# Patient Record
Sex: Female | Born: 1970 | Race: Black or African American | Hispanic: No | Marital: Single | Smoking: Never smoker
Health system: Southern US, Community
[De-identification: ages and names within clinical notes are randomized; demographics above are authoritative.]

## PROBLEM LIST (undated history)

## (undated) DIAGNOSIS — F419 Anxiety disorder, unspecified: Secondary | ICD-10-CM

## (undated) DIAGNOSIS — Z1379 Encounter for other screening for genetic and chromosomal anomalies: Secondary | ICD-10-CM

## (undated) DIAGNOSIS — C50919 Malignant neoplasm of unspecified site of unspecified female breast: Secondary | ICD-10-CM

## (undated) DIAGNOSIS — N920 Excessive and frequent menstruation with regular cycle: Secondary | ICD-10-CM

## (undated) DIAGNOSIS — R87610 Atypical squamous cells of undetermined significance on cytologic smear of cervix (ASC-US): Secondary | ICD-10-CM

## (undated) DIAGNOSIS — C50419 Malignant neoplasm of upper-outer quadrant of unspecified female breast: Secondary | ICD-10-CM

## (undated) DIAGNOSIS — N870 Mild cervical dysplasia: Secondary | ICD-10-CM

## (undated) DIAGNOSIS — E559 Vitamin D deficiency, unspecified: Secondary | ICD-10-CM

## (undated) DIAGNOSIS — D649 Anemia, unspecified: Secondary | ICD-10-CM

## (undated) DIAGNOSIS — G43909 Migraine, unspecified, not intractable, without status migrainosus: Secondary | ICD-10-CM

## (undated) DIAGNOSIS — Z9221 Personal history of antineoplastic chemotherapy: Secondary | ICD-10-CM

## (undated) HISTORY — PX: PORTACATH PLACEMENT: SHX2246

## (undated) HISTORY — DX: Atypical squamous cells of undetermined significance on cytologic smear of cervix (ASC-US): R87.610

## (undated) HISTORY — DX: Excessive and frequent menstruation with regular cycle: N92.0

## (undated) HISTORY — DX: Encounter for other screening for genetic and chromosomal anomalies: Z13.79

## (undated) HISTORY — DX: Malignant neoplasm of upper-outer quadrant of unspecified female breast: C50.419

## (undated) HISTORY — DX: Mild cervical dysplasia: N87.0

## (undated) HISTORY — DX: Anemia, unspecified: D64.9

## (undated) HISTORY — DX: Migraine, unspecified, not intractable, without status migrainosus: G43.909

## (undated) HISTORY — PX: ABDOMINAL HYSTERECTOMY: SHX81

## (undated) HISTORY — DX: Vitamin D deficiency, unspecified: E55.9

## (undated) HISTORY — PX: TUBAL LIGATION: SHX77

## (undated) HISTORY — DX: Malignant neoplasm of unspecified site of unspecified female breast: C50.919

---

## 1996-12-05 HISTORY — PX: CERVICAL BIOPSY  W/ LOOP ELECTRODE EXCISION: SUR135

## 2009-07-08 ENCOUNTER — Ambulatory Visit: Payer: Self-pay

## 2010-07-13 ENCOUNTER — Ambulatory Visit: Payer: Self-pay

## 2010-09-15 HISTORY — PX: ESSURE TUBAL LIGATION: SUR464

## 2010-12-24 ENCOUNTER — Ambulatory Visit: Payer: Self-pay | Admitting: Obstetrics and Gynecology

## 2011-07-18 ENCOUNTER — Ambulatory Visit: Payer: Self-pay

## 2011-07-26 ENCOUNTER — Ambulatory Visit: Payer: Self-pay

## 2013-12-05 HISTORY — PX: EXCISIONAL HEMORRHOIDECTOMY: SHX1541

## 2014-02-02 DIAGNOSIS — Z1379 Encounter for other screening for genetic and chromosomal anomalies: Secondary | ICD-10-CM

## 2014-02-02 DIAGNOSIS — C50919 Malignant neoplasm of unspecified site of unspecified female breast: Secondary | ICD-10-CM

## 2014-02-02 HISTORY — DX: Encounter for other screening for genetic and chromosomal anomalies: Z13.79

## 2014-02-02 HISTORY — PX: BREAST BIOPSY: SHX20

## 2014-02-02 HISTORY — DX: Malignant neoplasm of unspecified site of unspecified female breast: C50.919

## 2014-02-10 ENCOUNTER — Ambulatory Visit: Payer: Self-pay

## 2014-02-12 ENCOUNTER — Ambulatory Visit: Payer: Self-pay

## 2014-02-18 ENCOUNTER — Encounter: Payer: Self-pay | Admitting: General Surgery

## 2014-02-18 ENCOUNTER — Ambulatory Visit (INDEPENDENT_AMBULATORY_CARE_PROVIDER_SITE_OTHER): Payer: BC Managed Care – PPO | Admitting: General Surgery

## 2014-02-18 VITALS — BP 104/70 | HR 68 | Resp 12 | Ht 63.0 in | Wt 127.0 lb

## 2014-02-18 DIAGNOSIS — Z170 Estrogen receptor positive status [ER+]: Secondary | ICD-10-CM | POA: Insufficient documentation

## 2014-02-18 DIAGNOSIS — C50411 Malignant neoplasm of upper-outer quadrant of right female breast: Secondary | ICD-10-CM

## 2014-02-18 DIAGNOSIS — C50419 Malignant neoplasm of upper-outer quadrant of unspecified female breast: Secondary | ICD-10-CM

## 2014-02-18 DIAGNOSIS — C50919 Malignant neoplasm of unspecified site of unspecified female breast: Secondary | ICD-10-CM

## 2014-02-18 NOTE — Patient Instructions (Signed)
Patient to have labs drawn at [facility] lab and a chest x-ray when convenient (per patient's request due to the late hour).

## 2014-02-18 NOTE — Progress Notes (Signed)
Patient ID: Sandra Nguyen, female   DOB: 1971-01-02, 43 y.o.   MRN: [id number]  Chief Complaint  Patient presents with  . Other    breast cancer    HPI Sandra Nguyen is a 43 y.o. female who presents for a breast evaluation. The most recent mammogram was done on 02/10/14 as well as a right breast ultrasound. The patient noticed a mass in the left breast on a routine self-exam within the last month. She subsequently underwent a mammogram and ultrasound at the [facility] center followed by a core biopsy of the right breast on 02/12/14. The patient has been previously notified of the biopsy results. Patient does perform regular self breast checks and gets regular mammograms done. She states she found a lump 2 weeks ago prior to her mammogram. The area is located in the right upper outer quadrant of the right breast. No prior breast problems or injuries. The patient has had genetic testing instituted by Ardeth Perfect, PA at [place]. Results are still pending. The patient is accompanied today by her husband.  She works in the [employer]. The patient has a single daughter age 30 living in [place], with plans to marry her partner in April 2015. The patient has recently taken of the responsibility of raising her late brother's 10-monthold daughter. Her [place]  The patient denies any previous breast problems. No history of trauma to the breast.  HPI  History reviewed. No pertinent past medical history.  Past Surgical History  Procedure Laterality Date  . Leep  1990's  . Breast biopsy Right 2015    Family History  Problem Relation Age of Onset  . Cancer Maternal Aunt     breast  . Cancer Maternal Aunt     ovarian    Social History History  Substance Use Topics  . Smoking status: Never Smoker   . Smokeless tobacco: Never Used  . Alcohol Use: No    No Known Allergies  No current  outpatient prescriptions on file.   No current facility-administered medications for this visit.    Review of Systems Review of Systems  Constitutional: Negative.   Respiratory: Negative.   Cardiovascular: Negative.     Blood pressure 104/70, pulse 68, resp. rate 12, height '5\' 3"'  (1.6 m), weight 127 lb (57.607 kg), last menstrual period 02/06/2014.  Physical Exam Physical Exam  Constitutional: She is oriented to person, place, and time. She appears well-developed and well-nourished.  Neck: Neck supple. No thyromegaly present.  Cardiovascular: Normal rate, regular rhythm and normal heart sounds.   No murmur heard. Pulmonary/Chest: Effort normal and breath sounds normal. Right breast exhibits mass (2 cm mass just outside of areola 10 o'clock position.). Right breast exhibits no inverted nipple, no nipple discharge, no skin change and no tenderness. Left breast exhibits no inverted nipple, no mass, no nipple discharge, no skin change and no tenderness. Breasts are symmetrical.    The patient has a modest breast volume "B" cup on my estimation. She reports that her last fitting was for a "C" cup bra.   Lymphadenopathy:    She has no cervical adenopathy.    She has no axillary adenopathy.  Neurological: She is alert and oriented to person, place, and time.  Skin: Skin is warm and dry.    Data Reviewed Biopsy showed invasive mammary carcinoma with no specific type, histologic grade 2. 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 Receptor results are pending at this time.  The mass is estimated a 1.5 cm on imaging studies.  Assessment    Stage I carcinoma of the right breast.     Plan    We spent about 45 minutes reviewing where we stand with this early stage cancer and treatment options. Breast conservation and mastectomy were presented as equivalent therapeutic procedures. 6% risk of local recurrence over 10 years with breast conservation versus 2% with mastectomy, with no  increased morbidity for those patient to do experience recurrent after partial breast resection followed by salvage mastectomy.  Options for plastic surgery consultation were reviewed.  Options for medical oncology assessment were reviewed. Pending final delineation of size and nodal status, and that she is not a candidate for neoadjuvant chemotherapy, early consultation was not felt mandatory.  Options for second surgical pain were discussed. Informational brochure was provided.  Whether she should wait for her BRCA testing results to return before having surgery was reviewed. Her family history is not suggestive of a mutation carrier, and I think it's reasonable to proceed whenever the patient is ready.  Laboratory studies a chest x-ray will be obtained in the near future.      Patient to have the following labs drawn at [facility] lab today: CBC, Met C, CEA, and CA 27-29. This patient also needs a chest x-ray PA and Lateral (per patient's request, due to the late hour, she will have done at a later time). Order placed in order facilitator for patient to have done at her convenience.    Robert Bellow 02/18/2014, 9:05 PM

## 2014-02-19 LAB — COMPREHENSIVE METABOLIC PANEL WITH GFR
ALT: 11 IU/L (ref 0–32)
AST: 17 IU/L (ref 0–40)
Albumin/Globulin Ratio: 2 (ref 1.1–2.5)
Albumin: 4.3 g/dL (ref 3.5–5.5)
Alkaline Phosphatase: 56 IU/L (ref 39–117)
BUN/Creatinine Ratio: 13 (ref 9–23)
BUN: 10 mg/dL (ref 6–24)
CO2: 21 mmol/L (ref 18–29)
Calcium: 9.3 mg/dL (ref 8.7–10.2)
Chloride: 108 mmol/L (ref 97–108)
Creatinine, Ser: 0.78 mg/dL (ref 0.57–1.00)
GFR calc Af Amer: 107 mL/min/1.73
GFR calc non Af Amer: 93 mL/min/1.73
Globulin, Total: 2.1 g/dL (ref 1.5–4.5)
Glucose: 79 mg/dL (ref 65–99)
Potassium: 4.3 mmol/L (ref 3.5–5.2)
Sodium: 144 mmol/L (ref 134–144)
Total Bilirubin: <0.2 mg/dL (ref 0.0–1.2)
Total Protein: 6.4 g/dL (ref 6.0–8.5)

## 2014-02-19 LAB — CBC WITH DIFFERENTIAL
Basophils Absolute: 0 x10E3/uL (ref 0.0–0.2)
Basos: 0 %
Eos: 4 %
Eosinophils Absolute: 0.2 x10E3/uL (ref 0.0–0.4)
HCT: 35 % (ref 34.0–46.6)
Hemoglobin: 11.2 g/dL (ref 11.1–15.9)
Immature Grans (Abs): 0 x10E3/uL (ref 0.0–0.1)
Immature Granulocytes: 0 %
Lymphocytes Absolute: 2.3 x10E3/uL (ref 0.7–3.1)
Lymphs: 46 %
MCH: 28.1 pg (ref 26.6–33.0)
MCHC: 32 g/dL (ref 31.5–35.7)
MCV: 88 fL (ref 79–97)
Monocytes Absolute: 0.3 x10E3/uL (ref 0.1–0.9)
Monocytes: 7 %
Neutrophils Absolute: 2.1 x10E3/uL (ref 1.4–7.0)
Neutrophils Relative %: 43 %
Platelets: 272 x10E3/uL (ref 150–379)
RBC: 3.98 x10E6/uL (ref 3.77–5.28)
RDW: 14.3 % (ref 12.3–15.4)
WBC: 4.9 x10E3/uL (ref 3.4–10.8)

## 2014-02-19 LAB — CANCER ANTIGEN 27.29: CA 27.29: 37.4 U/mL (ref 0.0–38.6)

## 2014-02-19 LAB — PATHOLOGY REPORT

## 2014-02-19 LAB — CEA: CEA: 0.5 ng/mL (ref 0.0–4.7)

## 2014-02-20 ENCOUNTER — Telehealth: Payer: Self-pay | Admitting: General Surgery

## 2014-02-20 NOTE — Telephone Encounter (Signed)
Notified labs OK. ER/PR positive, HER 2 neu over-expressing. May be a candidate for neo-adjuvant chemo under protocol to shrink tumor and make lumpectomy (her choice for surgical treatment) more successful. She is amenable to meet w/ med onc. Case discussed w/ Dr. Oliva Bustard from medical oncology.

## 2014-02-21 ENCOUNTER — Ambulatory Visit: Payer: Self-pay | Admitting: Oncology

## 2014-02-21 ENCOUNTER — Telehealth: Payer: Self-pay | Admitting: *Deleted

## 2014-02-21 NOTE — Telephone Encounter (Signed)
Message left for the [facility] that we need an appointment for this patient.

## 2014-02-21 NOTE — Telephone Encounter (Signed)
Patient has been scheduled for an appointment at the [facility] for 02-25-14 at 9 am. This patient is aware of date, time, and instructions.

## 2014-02-21 NOTE — Telephone Encounter (Signed)
Message copied by Dominga Ferry on Fri Feb 21, 2014 10:38 AM ------      Message from: Christmas, Forest Gleason      Created: Thu Feb 20, 2014  7:09 PM       Please make an appt w/ Dr. Oliva Bustard for early next week: HER 2 positive breast cancer.  (I have spoken with him, and patient is aware you will be arranging. She is out of town, so call her cell w/ appt time. Thanks.  ------

## 2014-02-24 ENCOUNTER — Ambulatory Visit: Payer: Self-pay | Admitting: General Surgery

## 2014-02-25 ENCOUNTER — Ambulatory Visit: Payer: Self-pay | Admitting: Oncology

## 2014-02-25 ENCOUNTER — Telehealth: Payer: Self-pay | Admitting: General Surgery

## 2014-02-25 NOTE — Telephone Encounter (Signed)
The patient was seen by Forest Gleason, M.D. In the multidisciplinary breast clinic earlier this morning. In light of her HER-2/neu status she is felt to be a candidate for neoadjuvant chemotherapy and a power port was requested.  The Randall power port placement to allow her to have her treatment without interruption was reviewed. The risks of the procedure including injury to the adjacent vasculature and long was discussed. She is amenable to proceed in this will be scheduled at a convenient time.

## 2014-02-26 ENCOUNTER — Other Ambulatory Visit: Payer: Self-pay | Admitting: General Surgery

## 2014-02-26 ENCOUNTER — Ambulatory Visit: Payer: BC Managed Care – PPO | Admitting: General Surgery

## 2014-02-26 DIAGNOSIS — C50919 Malignant neoplasm of unspecified site of unspecified female breast: Secondary | ICD-10-CM

## 2014-02-27 ENCOUNTER — Telehealth: Payer: Self-pay | Admitting: *Deleted

## 2014-02-27 NOTE — Telephone Encounter (Signed)
Patient states the productive cough is yellow in color in the am hours. Dr Bary Castilla aware, Zithromax ( #6) 2 today and one q day x4 and Tussinex (120 ml)  5 ml po BID as needed, RX written, pt agrees and aware to pick up RX.

## 2014-02-27 NOTE — Telephone Encounter (Signed)
I talked with the patient and she said she can't tell that she is a lot better. She is still coughing , spratic but once she starts it seem to be "multiple coughs in a row", worse when she is hot. Denies fever.

## 2014-02-28 ENCOUNTER — Ambulatory Visit: Payer: Self-pay | Admitting: General Surgery

## 2014-02-28 ENCOUNTER — Encounter: Payer: Self-pay | Admitting: General Surgery

## 2014-02-28 DIAGNOSIS — C50919 Malignant neoplasm of unspecified site of unspecified female breast: Secondary | ICD-10-CM

## 2014-03-03 ENCOUNTER — Telehealth: Payer: Self-pay | Admitting: *Deleted

## 2014-03-03 ENCOUNTER — Encounter: Payer: Self-pay | Admitting: General Surgery

## 2014-03-03 NOTE — Telephone Encounter (Addendum)
Patient called the answering service 03/02/14,returning her call at 9:08 on 03/03/14 .Patient talked with Dr.Smith  about taking off her dressing he states that she can leave the dressing off.Patient pleased.

## 2014-03-04 LAB — HCG, QUANTITATIVE, PREGNANCY

## 2014-03-05 ENCOUNTER — Ambulatory Visit: Payer: Self-pay | Admitting: Oncology

## 2014-03-05 LAB — CBC CANCER CENTER
BASOS ABS: 0 x10 3/mm (ref 0.0–0.1)
Basophil %: 0.2 %
EOS PCT: 3.1 %
Eosinophil #: 0.2 x10 3/mm (ref 0.0–0.7)
HCT: 35.2 % (ref 35.0–47.0)
HGB: 11.3 g/dL — ABNORMAL LOW (ref 12.0–16.0)
Lymphocyte #: 1.9 x10 3/mm (ref 1.0–3.6)
Lymphocyte %: 37.6 %
MCH: 27.8 pg (ref 26.0–34.0)
MCHC: 32.1 g/dL (ref 32.0–36.0)
MCV: 87 fL (ref 80–100)
Monocyte #: 0.4 x10 3/mm (ref 0.2–0.9)
Monocyte %: 8.5 %
NEUTROS PCT: 50.6 %
Neutrophil #: 2.6 x10 3/mm (ref 1.4–6.5)
Platelet: 301 x10 3/mm (ref 150–440)
RBC: 4.06 10*6/uL (ref 3.80–5.20)
RDW: 13.5 % (ref 11.5–14.5)
WBC: 5.2 x10 3/mm (ref 3.6–11.0)

## 2014-03-05 LAB — COMPREHENSIVE METABOLIC PANEL
ALT: 19 U/L (ref 12–78)
ANION GAP: 7 (ref 7–16)
AST: 20 U/L (ref 15–37)
Albumin: 3.9 g/dL (ref 3.4–5.0)
Alkaline Phosphatase: 60 U/L
BILIRUBIN TOTAL: 0.3 mg/dL (ref 0.2–1.0)
BUN: 17 mg/dL (ref 7–18)
CO2: 27 mmol/L (ref 21–32)
Calcium, Total: 9 mg/dL (ref 8.5–10.1)
Chloride: 105 mmol/L (ref 98–107)
Creatinine: 0.79 mg/dL (ref 0.60–1.30)
EGFR (African American): >60
EGFR (Non-African Amer.): >60
GLUCOSE: 83 mg/dL (ref 65–99)
Osmolality: 278 (ref 275–301)
Potassium: 3.8 mmol/L (ref 3.5–5.1)
SODIUM: 139 mmol/L (ref 136–145)
Total Protein: 7.5 g/dL (ref 6.4–8.2)

## 2014-03-12 LAB — CBC CANCER CENTER
Basophil #: 0 x10 3/mm (ref 0.0–0.1)
Basophil %: 0.6 %
EOS PCT: 0.7 %
Eosinophil #: 0 x10 3/mm (ref 0.0–0.7)
HCT: 36.1 % (ref 35.0–47.0)
HGB: 11.5 g/dL — ABNORMAL LOW (ref 12.0–16.0)
Lymphocyte #: 1 x10 3/mm (ref 1.0–3.6)
Lymphocyte %: 53.3 %
MCH: 27.4 pg (ref 26.0–34.0)
MCHC: 31.8 g/dL — AB (ref 32.0–36.0)
MCV: 86 fL (ref 80–100)
MONOS PCT: 4.5 %
Monocyte #: 0.1 x10 3/mm — ABNORMAL LOW (ref 0.2–0.9)
Neutrophil #: 0.7 x10 3/mm — ABNORMAL LOW (ref 1.4–6.5)
Neutrophil %: 40.9 %
PLATELETS: 248 x10 3/mm (ref 150–440)
RBC: 4.18 10*6/uL (ref 3.80–5.20)
RDW: 13.1 % (ref 11.5–14.5)
WBC: 1.8 x10 3/mm — CL (ref 3.6–11.0)

## 2014-03-17 ENCOUNTER — Encounter: Payer: Self-pay | Admitting: General Surgery

## 2014-03-17 ENCOUNTER — Ambulatory Visit (INDEPENDENT_AMBULATORY_CARE_PROVIDER_SITE_OTHER): Payer: BC Managed Care – PPO | Admitting: General Surgery

## 2014-03-17 VITALS — BP 112/72 | HR 80 | Resp 12 | Ht 63.0 in | Wt 121.0 lb

## 2014-03-17 DIAGNOSIS — K648 Other hemorrhoids: Secondary | ICD-10-CM | POA: Insufficient documentation

## 2014-03-17 DIAGNOSIS — K649 Unspecified hemorrhoids: Secondary | ICD-10-CM

## 2014-03-17 MED ORDER — HYDROCORTISONE ACE-PRAMOXINE 1-1 % RE CREA: 1.0000 "application " | TOPICAL_CREAM | Freq: Three times a day (TID) | RECTAL | Status: DC

## 2014-03-17 MED ORDER — METHYLPREDNISOLONE (PAK) 4 MG PO TABS: 4.0000 mg | ORAL_TABLET | Freq: Every day | ORAL | Status: DC

## 2014-03-17 NOTE — Progress Notes (Signed)
Patient ID: Sandra Nguyen, female   DOB: 12-19-1970, 43 y.o.   MRN: [id number]  Chief Complaint  Patient presents with  . Follow-up    hemorrhoids    HPI Sandra Nguyen is a 43 y.o. female here today for a evaluation of hemorrhoids. Patient started her neo adjuvant chemotherap  on April 1. Patient states she noted hemorrhoids with pain on April 6 and bleeding started on April 7. Patient states its bright red blood on the paper and coating the stool.  Patient has made use of Prep H as well as medicated suppositories.   The bleeding is stopped, but pain persists. She had diarrhea developing one day after the first chemotherapy treatment (April 2). This has abated, and she reports soft stools at this time. HPI  Past Medical History  Diagnosis Date  . Cancer     Past Surgical History  Procedure Laterality Date  . Leep  1990's  . Breast biopsy Right 2015    Family History  Problem Relation Age of Onset  . Cancer Maternal Aunt     breast cancer, age greater than 46 at diagnosis  . Cancer Maternal Aunt     cervical    Social History History  Substance Use Topics  . Smoking status: Never Smoker   . Smokeless tobacco: Never Used  . Alcohol Use: No    No Known Allergies  Current Outpatient Prescriptions  Medication Sig Dispense Refill  . traMADol (ULTRAM) 50 MG tablet Take by mouth every 6 (six) hours as needed.      . methylPREDNIsolone (MEDROL DOSPACK) 4 MG tablet Take 1 tablet (4 mg total) by mouth daily. follow package directions  21 tablet  0  . pramoxine-hydrocortisone (ANALPRAM-HC) 1-1 % rectal cream Place 1 application rectally 3 (three) times daily.  30 g  1   No current facility-administered medications for this visit.    Review of Systems Review of Systems  Constitutional: Positive for chills. Negative for fever, diaphoresis, activity change, appetite change, fatigue and unexpected weight change.  Respiratory: Negative.   Cardiovascular: Negative.    Gastrointestinal: Positive for blood in stool, anal bleeding and rectal pain. Negative for nausea, vomiting, abdominal pain, diarrhea, constipation and abdominal distention.    Blood pressure 112/72, pulse 80, resp. rate 12, height 5\' 3"  (1.6 m), weight 121 lb (54.885 kg), last menstrual period 02/06/2014.  Physical Exam Physical Exam  Constitutional: She is oriented to person, place, and time. She appears well-developed.  Eyes: Conjunctivae are normal.  Neck: Neck supple.  Cardiovascular: Normal rate, regular rhythm, intact distal pulses and normal pulses.   Pulmonary/Chest: Breath sounds normal.  Abdominal: Soft. Normal appearance and bowel sounds are normal.  Genitourinary: Rectal exam shows external hemorrhoid ( 7 and 11o'clock).     Neurological: She is alert and oriented to person, place, and time.  Skin: Skin is warm and dry.     Assessment    External hemorrhoids with ulceration, no clot for drainage.    Plan    The patient will be placed on Analpram-HC 0.5% cream 3 times a day and was given a Medrol Dosepak. She was instructed to take all 6 tablets at one time, decreasing by 1 tablet daily until complete. Topical measures such as ice were encouraged for comfort. Minimal walking/standing was encouraged.  The patient will give a phone followup by April 16 at the latest if she is not significantly improved.  Case was discussed with Blain Pais, M.D. for medical oncology.  Forest Gleason Sorrel Cassetta 03/17/2014, 11:10 AM

## 2014-03-17 NOTE — Patient Instructions (Signed)
Patient to call the office by Thursday and let us know how you are doing.

## 2014-03-19 ENCOUNTER — Telehealth: Payer: Self-pay | Admitting: *Deleted

## 2014-03-19 ENCOUNTER — Telehealth: Payer: Self-pay

## 2014-03-19 ENCOUNTER — Other Ambulatory Visit: Payer: Self-pay | Admitting: General Surgery

## 2014-03-19 DIAGNOSIS — K648 Other hemorrhoids: Secondary | ICD-10-CM

## 2014-03-19 LAB — CBC CANCER CENTER
BASOS PCT: 0.2 %
Basophil #: 0 x10 3/mm (ref 0.0–0.1)
Eosinophil #: 0 x10 3/mm (ref 0.0–0.7)
Eosinophil %: 0.1 %
HCT: 34.8 % — ABNORMAL LOW (ref 35.0–47.0)
HGB: 10.9 g/dL — AB (ref 12.0–16.0)
Lymphocyte #: 1.9 x10 3/mm (ref 1.0–3.6)
Lymphocyte %: 29.5 %
MCH: 27.2 pg (ref 26.0–34.0)
MCHC: 31.4 g/dL — ABNORMAL LOW (ref 32.0–36.0)
MCV: 87 fL (ref 80–100)
MONOS PCT: 17.6 %
Monocyte #: 1.1 x10 3/mm — ABNORMAL HIGH (ref 0.2–0.9)
Neutrophil #: 3.4 x10 3/mm (ref 1.4–6.5)
Neutrophil %: 52.6 %
Platelet: 331 x10 3/mm (ref 150–440)
RBC: 4.02 10*6/uL (ref 3.80–5.20)
RDW: 13.1 % (ref 11.5–14.5)
WBC: 6.5 x10 3/mm (ref 3.6–11.0)

## 2014-03-19 NOTE — Telephone Encounter (Signed)
Patient is scheduled for a Hemorrhoidectomy at [facility] for 03/20/14. She will pre admit at [facility] at 12pm on 03/20/14. Patient may take her Tramadol and her prednisone at 6am with a small sip of water, but nothing to eat or drink after that. Patient is aware of date, time, and instructions.

## 2014-03-19 NOTE — Progress Notes (Signed)
The patient called reporting no improvement since initiation of steroids and Analpram-HC. We'll discuss hemorrhoidectomy when she returns my call.

## 2014-03-19 NOTE — Telephone Encounter (Signed)
Phone call from patient states she was here Monday and was suppose to call back with a status update.  She states she is no better, pharmacy gave her foam not cream and it may have help but only briefly.  The area (hemorrhoids) are hurting now and started bleeding today.

## 2014-03-20 ENCOUNTER — Ambulatory Visit: Payer: Self-pay | Admitting: General Surgery

## 2014-03-20 DIAGNOSIS — K648 Other hemorrhoids: Secondary | ICD-10-CM

## 2014-03-21 ENCOUNTER — Encounter: Payer: Self-pay | Admitting: General Surgery

## 2014-03-24 ENCOUNTER — Encounter: Payer: Self-pay | Admitting: General Surgery

## 2014-03-24 ENCOUNTER — Ambulatory Visit: Payer: BC Managed Care – PPO | Admitting: General Surgery

## 2014-03-24 ENCOUNTER — Ambulatory Visit (INDEPENDENT_AMBULATORY_CARE_PROVIDER_SITE_OTHER): Payer: BC Managed Care – PPO | Admitting: General Surgery

## 2014-03-24 VITALS — BP 100/62 | HR 80 | Resp 14 | Ht 63.0 in | Wt 122.0 lb

## 2014-03-24 DIAGNOSIS — K648 Other hemorrhoids: Secondary | ICD-10-CM

## 2014-03-24 DIAGNOSIS — K649 Unspecified hemorrhoids: Secondary | ICD-10-CM

## 2014-03-24 LAB — PATHOLOGY REPORT

## 2014-03-24 NOTE — Patient Instructions (Signed)
Patient to return in 1 month for a follow up. The patient is aware to call back for any questions or concerns.

## 2014-03-24 NOTE — Progress Notes (Signed)
Patient ID: Sandra Nguyen, female   DOB: Apr 21, 1971, 43 y.o.   MRN: [id number]  Chief Complaint  Patient presents with  . Follow-up    post op hemorrhoidectomy    HPI Sandra Nguyen is a 43 y.o. female who presents for a post op hemorrhoidectomy. The procedure was performed on 03/20/14. The patient states she has a twinge of pain occasionally as well as some rectal bleeding. Overall doing well. The patient has experienced profound relief of her pain post hemorrhoidectomy.  HPI  Past Medical History  Diagnosis Date  . Breast cancer March 2015     Clinical T1c, N0    Past Surgical History  Procedure Laterality Date  . Leep  1990's  . Breast biopsy Right 2015  . Excisional hemorrhoidectomy  2015    Family History  Problem Relation Age of Onset  . Cancer Maternal Aunt     breast cancer, age greater than 4 at diagnosis  . Cancer Maternal Aunt     cervical    Social History History  Substance Use Topics  . Smoking status: Never Smoker   . Smokeless tobacco: Never Used  . Alcohol Use: No    No Known Allergies  Current Outpatient Prescriptions  Medication Sig Dispense Refill  . methylPREDNIsolone (MEDROL DOSPACK) 4 MG tablet Take 1 tablet (4 mg total) by mouth daily. follow package directions  21 tablet  0  . oxyCODONE-acetaminophen (PERCOCET/ROXICET) 5-325 MG per tablet as needed.      . pramoxine-hydrocortisone (ANALPRAM-HC) 1-1 % rectal cream Place 1 application rectally 3 (three) times daily.  30 g  1  . traMADol (ULTRAM) 50 MG tablet Take by mouth every 6 (six) hours as needed.       No current facility-administered medications for this visit.    Review of Systems Review of Systems  Constitutional: Negative.   Respiratory: Negative.   Cardiovascular: Negative.   Gastrointestinal: Negative.     Blood pressure 100/62, pulse 80, resp. rate 14, height 5\' 3"  (1.6 m), weight 122 lb (55.339 kg).  Physical Exam Physical Exam External anal exam showed minimal  swelling. Small amount of drainage. Intact suture line.     Assessment    Doing well post hemorrhoidectomy.     Plan    A prescription for Norco 5/325, #30 with description 1-2 p.o. Q.4 h. P.r.n. For pain was provided. No refills. Careful perianal care will continue. We'll plan for a final followup exam in regards to her hemorrhoids in one month. She will continue under the care of Dr. Oliva Bustard in regards to her breast cancer.     PCP: Roby Lofts Javen Ridings 03/24/2014, 8:33 PM

## 2014-03-25 ENCOUNTER — Encounter: Payer: Self-pay | Admitting: General Surgery

## 2014-03-25 NOTE — Progress Notes (Signed)
Patient ID: Sandra Nguyen, female   DOB: Oct 28, 1971, 43 y.o.   MRN: [id number]  Duplicate

## 2014-03-26 LAB — CBC CANCER CENTER
BASOS PCT: 0.4 %
Basophil #: 0 x10 3/mm (ref 0.0–0.1)
EOS PCT: 1.1 %
Eosinophil #: 0.1 x10 3/mm (ref 0.0–0.7)
HCT: 30.6 % — ABNORMAL LOW (ref 35.0–47.0)
HGB: 9.9 g/dL — ABNORMAL LOW (ref 12.0–16.0)
LYMPHS PCT: 33.4 %
Lymphocyte #: 1.5 x10 3/mm (ref 1.0–3.6)
MCH: 27.8 pg (ref 26.0–34.0)
MCHC: 32.3 g/dL (ref 32.0–36.0)
MCV: 86 fL (ref 80–100)
MONOS PCT: 12.6 %
Monocyte #: 0.6 x10 3/mm (ref 0.2–0.9)
NEUTROS PCT: 52.5 %
Neutrophil #: 2.4 x10 3/mm (ref 1.4–6.5)
PLATELETS: 215 x10 3/mm (ref 150–440)
RBC: 3.55 10*6/uL — ABNORMAL LOW (ref 3.80–5.20)
RDW: 12.7 % (ref 11.5–14.5)
WBC: 4.5 x10 3/mm (ref 3.6–11.0)

## 2014-03-26 LAB — COMPREHENSIVE METABOLIC PANEL
ALBUMIN: 3.3 g/dL — AB (ref 3.4–5.0)
ALK PHOS: 62 U/L
Anion Gap: 8 (ref 7–16)
BILIRUBIN TOTAL: 0.1 mg/dL — AB (ref 0.2–1.0)
BUN: 11 mg/dL (ref 7–18)
CHLORIDE: 107 mmol/L (ref 98–107)
CREATININE: 0.9 mg/dL (ref 0.60–1.30)
Calcium, Total: 8.7 mg/dL (ref 8.5–10.1)
Co2: 28 mmol/L (ref 21–32)
EGFR (African American): >60
EGFR (Non-African Amer.): >60
Glucose: 121 mg/dL — ABNORMAL HIGH (ref 65–99)
Osmolality: 286 (ref 275–301)
POTASSIUM: 3.2 mmol/L — AB (ref 3.5–5.1)
SGOT(AST): 19 U/L (ref 15–37)
SGPT (ALT): 19 U/L (ref 12–78)
Sodium: 143 mmol/L (ref 136–145)
Total Protein: 6.6 g/dL (ref 6.4–8.2)

## 2014-04-02 LAB — CBC CANCER CENTER
BASOS PCT: 0.8 %
Basophil #: 0 x10 3/mm (ref 0.0–0.1)
Eosinophil #: 0.1 x10 3/mm (ref 0.0–0.7)
Eosinophil %: 4.2 %
HCT: 31.9 % — ABNORMAL LOW (ref 35.0–47.0)
HGB: 10.5 g/dL — ABNORMAL LOW (ref 12.0–16.0)
LYMPHS PCT: 64.6 %
Lymphocyte #: 1.6 x10 3/mm (ref 1.0–3.6)
MCH: 27.7 pg (ref 26.0–34.0)
MCHC: 32.9 g/dL (ref 32.0–36.0)
MCV: 84 fL (ref 80–100)
MONO ABS: 0.1 x10 3/mm — AB (ref 0.2–0.9)
MONOS PCT: 4.9 %
NEUTROS PCT: 25.5 %
Neutrophil #: 0.6 x10 3/mm — ABNORMAL LOW (ref 1.4–6.5)
PLATELETS: 227 x10 3/mm (ref 150–440)
RBC: 3.78 10*6/uL — ABNORMAL LOW (ref 3.80–5.20)
RDW: 12.9 % (ref 11.5–14.5)
WBC: 2.4 x10 3/mm — AB (ref 3.6–11.0)

## 2014-04-03 ENCOUNTER — Emergency Department: Payer: Self-pay | Admitting: Emergency Medicine

## 2014-04-04 ENCOUNTER — Ambulatory Visit: Payer: Self-pay | Admitting: Oncology

## 2014-04-08 LAB — CBC CANCER CENTER
BASOS PCT: 0.1 %
Basophil #: 0 x10 3/mm (ref 0.0–0.1)
EOS ABS: 0 x10 3/mm (ref 0.0–0.7)
Eosinophil %: 0 %
HCT: 32.7 % — ABNORMAL LOW (ref 35.0–47.0)
HGB: 10.7 g/dL — AB (ref 12.0–16.0)
Lymphocyte #: 2.8 x10 3/mm (ref 1.0–3.6)
Lymphocyte %: 69.6 %
MCH: 28 pg (ref 26.0–34.0)
MCHC: 32.6 g/dL (ref 32.0–36.0)
MCV: 86 fL (ref 80–100)
Monocyte #: 0.6 x10 3/mm (ref 0.2–0.9)
Monocyte %: 14.8 %
NEUTROS PCT: 15.5 %
Neutrophil #: 0.6 x10 3/mm — ABNORMAL LOW (ref 1.4–6.5)
PLATELETS: 310 x10 3/mm (ref 150–440)
RBC: 3.82 10*6/uL (ref 3.80–5.20)
RDW: 13.4 % (ref 11.5–14.5)
WBC: 4 x10 3/mm (ref 3.6–11.0)

## 2014-04-16 LAB — COMPREHENSIVE METABOLIC PANEL
AST: 18 U/L (ref 15–37)
Albumin: 3.7 g/dL (ref 3.4–5.0)
Alkaline Phosphatase: 56 U/L
Anion Gap: 10 (ref 7–16)
BILIRUBIN TOTAL: 0.2 mg/dL (ref 0.2–1.0)
BUN: 12 mg/dL (ref 7–18)
CO2: 27 mmol/L (ref 21–32)
CREATININE: 0.86 mg/dL (ref 0.60–1.30)
Calcium, Total: 8.5 mg/dL (ref 8.5–10.1)
Chloride: 106 mmol/L (ref 98–107)
EGFR (African American): >60
Glucose: 105 mg/dL — ABNORMAL HIGH (ref 65–99)
OSMOLALITY: 285 (ref 275–301)
Potassium: 3 mmol/L — ABNORMAL LOW (ref 3.5–5.1)
SGPT (ALT): 22 U/L (ref 12–78)
Sodium: 143 mmol/L (ref 136–145)
Total Protein: 7 g/dL (ref 6.4–8.2)

## 2014-04-16 LAB — CBC CANCER CENTER
Basophil #: 0 x10 3/mm (ref 0.0–0.1)
Basophil %: 0.6 %
EOS PCT: 0.4 %
Eosinophil #: 0 x10 3/mm (ref 0.0–0.7)
HCT: 32.1 % — AB (ref 35.0–47.0)
HGB: 10.6 g/dL — AB (ref 12.0–16.0)
LYMPHS PCT: 45.6 %
Lymphocyte #: 1.7 x10 3/mm (ref 1.0–3.6)
MCH: 28.1 pg (ref 26.0–34.0)
MCHC: 32.9 g/dL (ref 32.0–36.0)
MCV: 85 fL (ref 80–100)
Monocyte #: 0.5 x10 3/mm (ref 0.2–0.9)
Monocyte %: 13.5 %
NEUTROS ABS: 1.5 x10 3/mm (ref 1.4–6.5)
NEUTROS PCT: 39.9 %
Platelet: 211 x10 3/mm (ref 150–440)
RBC: 3.75 10*6/uL — ABNORMAL LOW (ref 3.80–5.20)
RDW: 13.9 % (ref 11.5–14.5)
WBC: 3.7 x10 3/mm (ref 3.6–11.0)

## 2014-04-23 LAB — CBC CANCER CENTER
Basophil #: 0 x10 3/mm (ref 0.0–0.1)
Basophil %: 0.7 %
EOS PCT: 0.3 %
Eosinophil #: 0 x10 3/mm (ref 0.0–0.7)
HCT: 33.1 % — AB (ref 35.0–47.0)
HGB: 10.9 g/dL — ABNORMAL LOW (ref 12.0–16.0)
LYMPHS PCT: 76 %
Lymphocyte #: 1.5 x10 3/mm (ref 1.0–3.6)
MCH: 27.9 pg (ref 26.0–34.0)
MCHC: 33 g/dL (ref 32.0–36.0)
MCV: 84 fL (ref 80–100)
MONO ABS: 0.1 x10 3/mm — AB (ref 0.2–0.9)
Monocyte %: 3.1 %
NEUTROS PCT: 19.9 %
Neutrophil #: 0.4 x10 3/mm — ABNORMAL LOW (ref 1.4–6.5)
Platelet: 160 x10 3/mm (ref 150–440)
RBC: 3.92 10*6/uL (ref 3.80–5.20)
RDW: 14 % (ref 11.5–14.5)
WBC: 2 x10 3/mm — AB (ref 3.6–11.0)

## 2014-04-24 ENCOUNTER — Encounter: Payer: Self-pay | Admitting: General Surgery

## 2014-04-24 ENCOUNTER — Ambulatory Visit (INDEPENDENT_AMBULATORY_CARE_PROVIDER_SITE_OTHER): Payer: BC Managed Care – PPO | Admitting: General Surgery

## 2014-04-24 VITALS — BP 110/68 | HR 76 | Resp 12 | Ht 63.0 in | Wt 114.0 lb

## 2014-04-24 DIAGNOSIS — K649 Unspecified hemorrhoids: Secondary | ICD-10-CM

## 2014-04-24 DIAGNOSIS — C50419 Malignant neoplasm of upper-outer quadrant of unspecified female breast: Secondary | ICD-10-CM

## 2014-04-24 DIAGNOSIS — K648 Other hemorrhoids: Secondary | ICD-10-CM

## 2014-04-24 DIAGNOSIS — C50411 Malignant neoplasm of upper-outer quadrant of right female breast: Secondary | ICD-10-CM

## 2014-04-24 NOTE — Patient Instructions (Addendum)
Patient to return in 2 months. Patient to eat 1500 calories daily do to weight lose.

## 2014-04-24 NOTE — Progress Notes (Signed)
Patient ID: Sandra Nguyen, female   DOB: 1971/09/25, 43 y.o.   MRN: [id number]  Chief Complaint  Patient presents with  . Follow-up     hemorrhoidectomy.    HPI Sandra Nguyen is a 43 y.o. female who presents for a post op hemorrhoidectomy. The procedure was performed on 03/20/14. Patient states she is going well but alittle blood this morning.  Patient was seen in the ER for hives on 04/03/14.   HPI  Past Medical History  Diagnosis Date  . Breast cancer March 2015     Clinical T1c, N0    Past Surgical History  Procedure Laterality Date  . Leep  1990's  . Breast biopsy Right 2015  . Excisional hemorrhoidectomy  2015    Family History  Problem Relation Age of Onset  . Cancer Maternal Aunt     breast cancer, age greater than 16 at diagnosis  . Cancer Maternal Aunt     cervical    Social History History  Substance Use Topics  . Smoking status: Never Smoker   . Smokeless tobacco: Never Used  . Alcohol Use: No    No Known Allergies  Current Outpatient Prescriptions  Medication Sig Dispense Refill  . clonazePAM (KLONOPIN) 0.5 MG tablet Take 1 tablet by mouth as needed.      . methylPREDNIsolone (MEDROL DOSPACK) 4 MG tablet Take 1 tablet (4 mg total) by mouth daily. follow package directions  21 tablet  0  . oxyCODONE-acetaminophen (PERCOCET/ROXICET) 5-325 MG per tablet as needed.      . pramoxine-hydrocortisone (ANALPRAM-HC) 1-1 % rectal cream Place 1 application rectally 3 (three) times daily.  30 g  1  . traMADol (ULTRAM) 50 MG tablet Take by mouth every 6 (six) hours as needed.       No current facility-administered medications for this visit.    Review of Systems Review of Systems  Blood pressure 110/68, pulse 76, resp. rate 12, height 5\' 3"  (1.6 m), weight 114 lb (51.71 kg).  Physical Exam Physical Exam  Constitutional: She is oriented to person, place, and time. She appears well-developed and well-nourished.  Genitourinary:     Rectal area healing  well.   Neurological: She is alert and oriented to person, place, and time.  Skin: Skin is warm and dry.       Assessment    Doing well status post hemorrhoidectomy    Plan    The patient will follow up after completion of her neoadjuvant chemotherapy.    PCP: Roby Lofts Byrnett 04/25/2014, 9:33 AM

## 2014-04-30 LAB — CBC CANCER CENTER
Basophil #: 0 x10 3/mm (ref 0.0–0.1)
Basophil %: 0.1 %
EOS ABS: 0 x10 3/mm (ref 0.0–0.7)
EOS PCT: 0 %
HCT: 30.4 % — ABNORMAL LOW (ref 35.0–47.0)
HGB: 10.1 g/dL — AB (ref 12.0–16.0)
LYMPHS ABS: 1.3 x10 3/mm (ref 1.0–3.6)
LYMPHS PCT: 51.3 %
MCH: 28.5 pg (ref 26.0–34.0)
MCHC: 33.2 g/dL (ref 32.0–36.0)
MCV: 86 fL (ref 80–100)
MONO ABS: 0.6 x10 3/mm (ref 0.2–0.9)
Monocyte %: 23.6 %
Neutrophil #: 0.6 x10 3/mm — ABNORMAL LOW (ref 1.4–6.5)
Neutrophil %: 25 %
Platelet: 237 x10 3/mm (ref 150–440)
RBC: 3.55 10*6/uL — AB (ref 3.80–5.20)
RDW: 14.4 % (ref 11.5–14.5)
WBC: 2.4 x10 3/mm — ABNORMAL LOW (ref 3.6–11.0)

## 2014-05-01 ENCOUNTER — Telehealth: Payer: Self-pay | Admitting: *Deleted

## 2014-05-01 NOTE — Telephone Encounter (Signed)
Form completed as requested, aware at front desk for pick up.

## 2014-05-05 ENCOUNTER — Ambulatory Visit: Payer: Self-pay | Admitting: Oncology

## 2014-05-07 LAB — URINALYSIS, COMPLETE
BACTERIA: NONE SEEN
BILIRUBIN, UR: NEGATIVE
Blood: NEGATIVE
Glucose,UR: NEGATIVE mg/dL (ref 0–75)
KETONE: NEGATIVE
Nitrite: NEGATIVE
PH: 6 (ref 4.5–8.0)
Protein: NEGATIVE
Specific Gravity: 1.011 (ref 1.003–1.030)
Squamous Epithelial: NONE SEEN
WBC UR: 7 /HPF (ref 0–5)

## 2014-05-07 LAB — CBC CANCER CENTER
Basophil #: 0 x10 3/mm (ref 0.0–0.1)
Basophil %: 0.3 %
EOS PCT: 0.7 %
Eosinophil #: 0 x10 3/mm (ref 0.0–0.7)
HCT: 31 % — AB (ref 35.0–47.0)
HGB: 10.2 g/dL — ABNORMAL LOW (ref 12.0–16.0)
Lymphocyte #: 1.8 x10 3/mm (ref 1.0–3.6)
Lymphocyte %: 56.6 %
MCH: 28.3 pg (ref 26.0–34.0)
MCHC: 32.8 g/dL (ref 32.0–36.0)
MCV: 87 fL (ref 80–100)
Monocyte #: 0.3 x10 3/mm (ref 0.2–0.9)
Monocyte %: 10.6 %
NEUTROS PCT: 31.8 %
Neutrophil #: 1 x10 3/mm — ABNORMAL LOW (ref 1.4–6.5)
Platelet: 190 x10 3/mm (ref 150–440)
RBC: 3.59 10*6/uL — ABNORMAL LOW (ref 3.80–5.20)
RDW: 14.4 % (ref 11.5–14.5)
WBC: 3.1 x10 3/mm — AB (ref 3.6–11.0)

## 2014-05-07 LAB — COMPREHENSIVE METABOLIC PANEL
ALT: 27 U/L (ref 12–78)
ANION GAP: 9 (ref 7–16)
AST: 23 U/L (ref 15–37)
Albumin: 3.5 g/dL (ref 3.4–5.0)
Alkaline Phosphatase: 58 U/L
BUN: 14 mg/dL (ref 7–18)
Bilirubin,Total: 0.3 mg/dL (ref 0.2–1.0)
CALCIUM: 8.7 mg/dL (ref 8.5–10.1)
CHLORIDE: 105 mmol/L (ref 98–107)
CREATININE: 0.88 mg/dL (ref 0.60–1.30)
Co2: 28 mmol/L (ref 21–32)
Glucose: 104 mg/dL — ABNORMAL HIGH (ref 65–99)
Osmolality: 284 (ref 275–301)
POTASSIUM: 2.8 mmol/L — AB (ref 3.5–5.1)
SODIUM: 142 mmol/L (ref 136–145)
TOTAL PROTEIN: 6.7 g/dL (ref 6.4–8.2)

## 2014-05-08 LAB — URINE CULTURE

## 2014-05-14 LAB — CBC CANCER CENTER
BASOS PCT: 0.2 %
Basophil #: 0 x10 3/mm (ref 0.0–0.1)
EOS PCT: 8 %
Eosinophil #: 0.3 x10 3/mm (ref 0.0–0.7)
HCT: 29.6 % — ABNORMAL LOW (ref 35.0–47.0)
HGB: 9.7 g/dL — ABNORMAL LOW (ref 12.0–16.0)
Lymphocyte #: 1.4 x10 3/mm (ref 1.0–3.6)
Lymphocyte %: 42.6 %
MCH: 28.5 pg (ref 26.0–34.0)
MCHC: 32.7 g/dL (ref 32.0–36.0)
MCV: 87 fL (ref 80–100)
MONOS PCT: 11.5 %
Monocyte #: 0.4 x10 3/mm (ref 0.2–0.9)
NEUTROS ABS: 1.2 x10 3/mm — AB (ref 1.4–6.5)
NEUTROS PCT: 37.7 %
Platelet: 168 x10 3/mm (ref 150–440)
RBC: 3.39 10*6/uL — AB (ref 3.80–5.20)
RDW: 15.1 % — AB (ref 11.5–14.5)
WBC: 3.3 x10 3/mm — ABNORMAL LOW (ref 3.6–11.0)

## 2014-05-21 LAB — CBC CANCER CENTER
Basophil #: 0 x10 3/mm (ref 0.0–0.1)
Basophil %: 0.2 %
Eosinophil #: 0 x10 3/mm (ref 0.0–0.7)
Eosinophil %: 0.2 %
HCT: 33 % — AB (ref 35.0–47.0)
HGB: 10.8 g/dL — ABNORMAL LOW (ref 12.0–16.0)
LYMPHS ABS: 2.1 x10 3/mm (ref 1.0–3.6)
Lymphocyte %: 12.8 %
MCH: 28.2 pg (ref 26.0–34.0)
MCHC: 32.5 g/dL (ref 32.0–36.0)
MCV: 87 fL (ref 80–100)
MONO ABS: 1.3 x10 3/mm — AB (ref 0.2–0.9)
MONOS PCT: 8 %
Neutrophil #: 12.8 x10 3/mm — ABNORMAL HIGH (ref 1.4–6.5)
Neutrophil %: 78.8 %
PLATELETS: 190 x10 3/mm (ref 150–440)
RBC: 3.81 10*6/uL (ref 3.80–5.20)
RDW: 14.9 % — ABNORMAL HIGH (ref 11.5–14.5)
WBC: 16.2 x10 3/mm — AB (ref 3.6–11.0)

## 2014-05-28 LAB — CBC CANCER CENTER
BASOS ABS: 0 x10 3/mm (ref 0.0–0.1)
BASOS PCT: 0.3 %
EOS ABS: 0 x10 3/mm (ref 0.0–0.7)
EOS PCT: 0 %
HCT: 30.6 % — AB (ref 35.0–47.0)
HGB: 10.2 g/dL — ABNORMAL LOW (ref 12.0–16.0)
LYMPHS PCT: 29.5 %
Lymphocyte #: 1.2 x10 3/mm (ref 1.0–3.6)
MCH: 28.5 pg (ref 26.0–34.0)
MCHC: 33.4 g/dL (ref 32.0–36.0)
MCV: 86 fL (ref 80–100)
MONO ABS: 0.6 x10 3/mm (ref 0.2–0.9)
MONOS PCT: 15.8 %
Neutrophil #: 2.2 x10 3/mm (ref 1.4–6.5)
Neutrophil %: 54.4 %
PLATELETS: 189 x10 3/mm (ref 150–440)
RBC: 3.57 10*6/uL — ABNORMAL LOW (ref 3.80–5.20)
RDW: 14.9 % — AB (ref 11.5–14.5)
WBC: 4 x10 3/mm (ref 3.6–11.0)

## 2014-06-04 ENCOUNTER — Ambulatory Visit: Payer: Self-pay | Admitting: Oncology

## 2014-06-04 LAB — COMPREHENSIVE METABOLIC PANEL
ALBUMIN: 3.6 g/dL (ref 3.4–5.0)
ALT: 25 U/L (ref 12–78)
Alkaline Phosphatase: 57 U/L
Anion Gap: 9 (ref 7–16)
BUN: 12 mg/dL (ref 7–18)
Bilirubin,Total: 0.3 mg/dL (ref 0.2–1.0)
CALCIUM: 8.9 mg/dL (ref 8.5–10.1)
CREATININE: 0.87 mg/dL (ref 0.60–1.30)
Chloride: 105 mmol/L (ref 98–107)
Co2: 28 mmol/L (ref 21–32)
EGFR (Non-African Amer.): >60
Glucose: 95 mg/dL (ref 65–99)
Osmolality: 283 (ref 275–301)
Potassium: 2.8 mmol/L — ABNORMAL LOW (ref 3.5–5.1)
SGOT(AST): 27 U/L (ref 15–37)
Sodium: 142 mmol/L (ref 136–145)
Total Protein: 6.9 g/dL (ref 6.4–8.2)

## 2014-06-04 LAB — CBC CANCER CENTER
BASOS ABS: 0 x10 3/mm (ref 0.0–0.1)
BASOS PCT: 0.3 %
EOS ABS: 0.1 x10 3/mm (ref 0.0–0.7)
Eosinophil %: 3 %
HCT: 28.8 % — AB (ref 35.0–47.0)
HGB: 9.6 g/dL — ABNORMAL LOW (ref 12.0–16.0)
LYMPHS ABS: 1.7 x10 3/mm (ref 1.0–3.6)
Lymphocyte %: 42.8 %
MCH: 29.1 pg (ref 26.0–34.0)
MCHC: 33.3 g/dL (ref 32.0–36.0)
MCV: 87 fL (ref 80–100)
MONOS PCT: 16.4 %
Monocyte #: 0.6 x10 3/mm (ref 0.2–0.9)
NEUTROS ABS: 1.4 x10 3/mm (ref 1.4–6.5)
Neutrophil %: 37.5 %
PLATELETS: 249 x10 3/mm (ref 150–440)
RBC: 3.3 10*6/uL — AB (ref 3.80–5.20)
RDW: 15.5 % — ABNORMAL HIGH (ref 11.5–14.5)
WBC: 3.9 x10 3/mm (ref 3.6–11.0)

## 2014-06-16 ENCOUNTER — Other Ambulatory Visit: Payer: BC Managed Care – PPO

## 2014-06-16 ENCOUNTER — Encounter: Payer: Self-pay | Admitting: General Surgery

## 2014-06-16 ENCOUNTER — Ambulatory Visit (INDEPENDENT_AMBULATORY_CARE_PROVIDER_SITE_OTHER): Payer: BC Managed Care – PPO | Admitting: General Surgery

## 2014-06-16 VITALS — BP 118/66 | HR 76 | Resp 14 | Ht 62.5 in | Wt 114.0 lb

## 2014-06-16 DIAGNOSIS — C50419 Malignant neoplasm of upper-outer quadrant of unspecified female breast: Secondary | ICD-10-CM

## 2014-06-16 DIAGNOSIS — C50411 Malignant neoplasm of upper-outer quadrant of right female breast: Secondary | ICD-10-CM

## 2014-06-16 NOTE — Patient Instructions (Signed)
Patient's surgery has been scheduled for 07-10-14 at [facility].

## 2014-06-16 NOTE — Progress Notes (Signed)
Patient ID: Sandra Nguyen, female   DOB: [birth year], 43 y.o.   MRN: [id number]  Chief Complaint  Patient presents with  . Follow-up    2 month follow up breast cancer no mammogram     HPI Sandra Nguyen is a 43 y.o. female who presents for a 2 month follow up of right breast cancer. The patient has completed 4 6 planned chemotherapy this part treatments as of May 14, 2014. The additional 2 treatments were not delivered due to progressive weight loss and the development of neuropathy.  No new complaints at this time. Here to discuss surgery today. She is going every 3 weeks for Herceptin and Perjeta infusions.  Patient reports no further difficulty with her hemorrhoids.  HPI  Past Medical History  Diagnosis Date  . Breast cancer March 2015     Clinical T1c, N0    Past Surgical History  Procedure Laterality Date  . Leep  1990's  . Breast biopsy Right 2015  . Excisional hemorrhoidectomy  2015    Family History  Problem Relation Age of Onset  . Cancer Maternal Aunt     breast cancer, age greater than 81 at diagnosis  . Cancer Maternal Aunt     cervical    Social History History  Substance Use Topics  . Smoking status: Never Smoker   . Smokeless tobacco: Never Used  . Alcohol Use: No    No Known Allergies  Current Outpatient Prescriptions  Medication Sig Dispense Refill  . KLOR-CON M20 20 MEQ tablet Take 20 mEq by mouth daily.       . traZODone (DESYREL) 100 MG tablet Take 100 mg by mouth as needed.        No current facility-administered medications for this visit.    Review of Systems Review of Systems  Constitutional: Negative.   Respiratory: Negative.   Cardiovascular: Negative.     Blood pressure 118/66, pulse 76, resp. rate 14, height 5' 2.5" (1.588 m), weight 114 lb (51.71 kg).  Physical Exam Physical Exam  Constitutional: She is oriented to person, place, and time. She appears well-developed and well-nourished.  Neck: Neck supple. No thyromegaly  present.  Cardiovascular: Normal rate, regular rhythm and normal heart sounds.   No murmur heard. Pulmonary/Chest: Right breast exhibits no inverted nipple, no nipple discharge, no skin change and no tenderness. Left breast exhibits no inverted nipple, no mass, no nipple discharge, no skin change and no tenderness.    Lymphadenopathy:    She has no cervical adenopathy.    She has axillary adenopathy (New from March 2015).  Neurological: She is alert and oriented to person, place, and time.  Skin: Skin is warm and dry.    Data Reviewed Original right breast pathology showed invasive mammary carcinoma, ER 90%, PR 90%, HER-2/neu-positive.  Ultrasound examination of the right breast at the 11:00 position, 3 cm from the nipple condition show a hypoechoic mass measuring 0.76 x 1.23 x 1.38 cm. Previously this measured 0.8 x 1.4 x 1.6 cm. BI-RAD-5.  Ultrasound examination of the palpable low in the right axilla showed a 0.8 x 1.6 x 1.7 cm lymph node with normal echo architecture.  Assessment    Right breast cancer, modest decrease in size status post neoadjuvant chemotherapy.    Plan    Options for management were reviewed with the patient, her husband and parents. Breast conservation and mastectomy were presented is therapeutically equivalent. Options for reconstruction should mastectomy be chosen were reviewed. She has modest  breast volume, and still has a nearly 1.4 cm tumor centrally located in the breast. With wide excision followed by radiation, she might be less than satisfied with cosmetic result.  Considering her very small body habitus, she is likely not a candidate for a TRAM flap.  Opportunity for plastic surgery consultation prior to surgical intervention was discussed. At this time she would like to proceed mastectomy and delayed reconstruction to a later date.  Will discuss with the plastic surgery service whether there would be any advantage to completing a nipple sparing  mastectomy and immediate reconstruction was not planned. In any event we'll proceed with sentinel node biopsy and certainly plan to excise a large node appreciated on today's exam.  Surgery is tentatively scheduled for 07/10/2014.    Patient's surgery has been scheduled for 07-10-14 at [facility].  PCP: [name] Referring MD: Zenia Resides, Forest Gleason 06/17/2014, 5:22 PM

## 2014-06-17 ENCOUNTER — Other Ambulatory Visit: Payer: Self-pay | Admitting: General Surgery

## 2014-06-17 DIAGNOSIS — C50411 Malignant neoplasm of upper-outer quadrant of right female breast: Secondary | ICD-10-CM

## 2014-06-19 ENCOUNTER — Telehealth: Payer: Self-pay | Admitting: General Surgery

## 2014-06-19 NOTE — Telephone Encounter (Signed)
Reviewed options for immediate reconstruction. This would likely require a three week delay to bring plastics onboard.  This may not be an acceptable delay, but felt she needed to be aware of this option. Will plan to proceed on August 6th unless she decides to investigate immediate reconstruction.

## 2014-06-24 ENCOUNTER — Ambulatory Visit: Payer: BC Managed Care – PPO | Admitting: General Surgery

## 2014-06-25 LAB — COMPREHENSIVE METABOLIC PANEL
Albumin: 3.7 g/dL (ref 3.4–5.0)
Alkaline Phosphatase: 69 U/L
Anion Gap: 8 (ref 7–16)
BILIRUBIN TOTAL: 0.2 mg/dL (ref 0.2–1.0)
BUN: 8 mg/dL (ref 7–18)
CALCIUM: 8.9 mg/dL (ref 8.5–10.1)
CHLORIDE: 107 mmol/L (ref 98–107)
Co2: 28 mmol/L (ref 21–32)
Creatinine: 0.93 mg/dL (ref 0.60–1.30)
Glucose: 90 mg/dL (ref 65–99)
Osmolality: 283 (ref 275–301)
Potassium: 3.4 mmol/L — ABNORMAL LOW (ref 3.5–5.1)
SGOT(AST): 26 U/L (ref 15–37)
SGPT (ALT): 29 U/L
Sodium: 143 mmol/L (ref 136–145)
Total Protein: 7.1 g/dL (ref 6.4–8.2)

## 2014-06-25 LAB — CBC CANCER CENTER
Basophil #: 0 x10 3/mm (ref 0.0–0.1)
Basophil %: 0.3 %
Eosinophil #: 0.2 x10 3/mm (ref 0.0–0.7)
Eosinophil %: 5.5 %
HCT: 31.2 % — ABNORMAL LOW (ref 35.0–47.0)
HGB: 10 g/dL — AB (ref 12.0–16.0)
Lymphocyte #: 1.9 x10 3/mm (ref 1.0–3.6)
Lymphocyte %: 42.8 %
MCH: 29 pg (ref 26.0–34.0)
MCHC: 32.2 g/dL (ref 32.0–36.0)
MCV: 90 fL (ref 80–100)
MONO ABS: 0.4 x10 3/mm (ref 0.2–0.9)
MONOS PCT: 9.4 %
NEUTROS PCT: 42 %
Neutrophil #: 1.8 x10 3/mm (ref 1.4–6.5)
PLATELETS: 227 x10 3/mm (ref 150–440)
RBC: 3.46 10*6/uL — ABNORMAL LOW (ref 3.80–5.20)
RDW: 15.5 % — ABNORMAL HIGH (ref 11.5–14.5)
WBC: 4.4 x10 3/mm (ref 3.6–11.0)

## 2014-07-05 ENCOUNTER — Ambulatory Visit: Payer: Self-pay | Admitting: Oncology

## 2014-07-05 DIAGNOSIS — C50419 Malignant neoplasm of upper-outer quadrant of unspecified female breast: Secondary | ICD-10-CM

## 2014-07-05 HISTORY — PX: MASTECTOMY: SHX3

## 2014-07-05 HISTORY — DX: Malignant neoplasm of upper-outer quadrant of unspecified female breast: C50.419

## 2014-07-07 LAB — HCG, QUANTITATIVE, PREGNANCY

## 2014-07-10 ENCOUNTER — Ambulatory Visit: Payer: Self-pay | Admitting: General Surgery

## 2014-07-10 ENCOUNTER — Encounter: Payer: Self-pay | Admitting: General Surgery

## 2014-07-10 DIAGNOSIS — C50419 Malignant neoplasm of upper-outer quadrant of unspecified female breast: Secondary | ICD-10-CM

## 2014-07-10 HISTORY — PX: BREAST SURGERY: SHX581

## 2014-07-13 ENCOUNTER — Other Ambulatory Visit: Payer: Self-pay | Admitting: General Surgery

## 2014-07-14 ENCOUNTER — Ambulatory Visit (INDEPENDENT_AMBULATORY_CARE_PROVIDER_SITE_OTHER): Payer: Self-pay | Admitting: General Surgery

## 2014-07-14 ENCOUNTER — Encounter: Payer: Self-pay | Admitting: General Surgery

## 2014-07-14 VITALS — BP 120/72 | HR 76 | Resp 12 | Ht 62.0 in | Wt 112.0 lb

## 2014-07-14 DIAGNOSIS — C50419 Malignant neoplasm of upper-outer quadrant of unspecified female breast: Secondary | ICD-10-CM

## 2014-07-14 DIAGNOSIS — C50411 Malignant neoplasm of upper-outer quadrant of right female breast: Secondary | ICD-10-CM

## 2014-07-14 LAB — PATHOLOGY REPORT

## 2014-07-14 NOTE — Progress Notes (Signed)
Patient ID: Sandra Nguyen, female   DOB: 1971/07/16, 43 y.o.   MRN: [id number]  Chief Complaint  Patient presents with  . Routine Post Op    right mastectomy    HPI Sandra Nguyen is a 43 y.o. female here today for her post op right mastectomy done on 07/10/14.Patient states she is doing well at this time. Drain volumes have fallen to less than 20 cc per day for the last 3 days.  HPI  Past Medical History  Diagnosis Date  . Breast cancer March 2015     Clinical T1c, N0    Past Surgical History  Procedure Laterality Date  . Leep  1990's  . Breast biopsy Right 2015  . Excisional hemorrhoidectomy  2015    Family History  Problem Relation Age of Onset  . Cancer Maternal Aunt     breast cancer, age greater than 36 at diagnosis  . Cancer Maternal Aunt     cervical    Social History History  Substance Use Topics  . Smoking status: Never Smoker   . Smokeless tobacco: Never Used  . Alcohol Use: No    No Known Allergies  Current Outpatient Prescriptions  Medication Sig Dispense Refill  . HYDROcodone-acetaminophen (NORCO/VICODIN) 5-325 MG per tablet       . KLOR-CON M20 20 MEQ tablet Take 20 mEq by mouth daily.       . traZODone (DESYREL) 100 MG tablet Take 100 mg by mouth as needed.        No current facility-administered medications for this visit.    Review of Systems Review of Systems  Blood pressure 120/72, pulse 76, resp. rate 12, height 5\' 2"  (1.575 m), weight 112 lb (50.803 kg).  Physical Exam Physical Exam  Constitutional: She is oriented to person, place, and time. She appears well-developed and well-nourished.  Neurological: She is alert and oriented to person, place, and time.  Skin: Skin is warm and dry.  Mastectomy site is healing without evidence of inflammation or induration. Blake drain removal without incident. Shoulder range of motion is near complete although she hasn't to reach full abduction.  Data Reviewed Pathology is pending.  Intraoperative report showed 3 negative sentinel node.  Assessment    Doing well status post right simple mastectomy and sentinel node biopsy.     Plan    A light compressive wrap was applied today which will be removed tomorrow. She may begin showering at that time. Followup exam in one week to assess for stroma formation.      PCP: Lanice Shirts 07/14/2014, 10:09 PM

## 2014-07-14 NOTE — Patient Instructions (Signed)
Patient to return in one week. 

## 2014-07-15 ENCOUNTER — Encounter: Payer: Self-pay | Admitting: General Surgery

## 2014-07-16 LAB — COMPREHENSIVE METABOLIC PANEL
ALBUMIN: 3.7 g/dL (ref 3.4–5.0)
ANION GAP: 10 (ref 7–16)
Alkaline Phosphatase: 73 U/L
BUN: 17 mg/dL (ref 7–18)
Bilirubin,Total: 0.2 mg/dL (ref 0.2–1.0)
CALCIUM: 9 mg/dL (ref 8.5–10.1)
Chloride: 102 mmol/L (ref 98–107)
Co2: 27 mmol/L (ref 21–32)
Creatinine: 0.78 mg/dL (ref 0.60–1.30)
EGFR (African American): >60
EGFR (Non-African Amer.): >60
GLUCOSE: 75 mg/dL (ref 65–99)
Osmolality: 278 (ref 275–301)
Potassium: 3.7 mmol/L (ref 3.5–5.1)
SGOT(AST): 22 U/L (ref 15–37)
SGPT (ALT): 23 U/L
SODIUM: 139 mmol/L (ref 136–145)
TOTAL PROTEIN: 7.1 g/dL (ref 6.4–8.2)

## 2014-07-16 LAB — CBC CANCER CENTER
BASOS ABS: 0 x10 3/mm (ref 0.0–0.1)
BASOS PCT: 0.2 %
EOS PCT: 5.2 %
Eosinophil #: 0.3 x10 3/mm (ref 0.0–0.7)
HCT: 34.2 % — AB (ref 35.0–47.0)
HGB: 11 g/dL — ABNORMAL LOW (ref 12.0–16.0)
LYMPHS PCT: 28.7 %
Lymphocyte #: 1.6 x10 3/mm (ref 1.0–3.6)
MCH: 29 pg (ref 26.0–34.0)
MCHC: 32.1 g/dL (ref 32.0–36.0)
MCV: 90 fL (ref 80–100)
MONOS PCT: 7.8 %
Monocyte #: 0.4 x10 3/mm (ref 0.2–0.9)
Neutrophil #: 3.3 x10 3/mm (ref 1.4–6.5)
Neutrophil %: 58.1 %
PLATELETS: 228 x10 3/mm (ref 150–440)
RBC: 3.78 10*6/uL — ABNORMAL LOW (ref 3.80–5.20)
RDW: 13.2 % (ref 11.5–14.5)
WBC: 5.7 x10 3/mm (ref 3.6–11.0)

## 2014-07-22 ENCOUNTER — Encounter: Payer: Self-pay | Admitting: General Surgery

## 2014-07-22 ENCOUNTER — Ambulatory Visit (INDEPENDENT_AMBULATORY_CARE_PROVIDER_SITE_OTHER): Payer: Self-pay | Admitting: General Surgery

## 2014-07-22 VITALS — BP 110/58 | HR 74 | Resp 12 | Ht 62.0 in | Wt 111.0 lb

## 2014-07-22 DIAGNOSIS — C50419 Malignant neoplasm of upper-outer quadrant of unspecified female breast: Secondary | ICD-10-CM

## 2014-07-22 DIAGNOSIS — C50411 Malignant neoplasm of upper-outer quadrant of right female breast: Secondary | ICD-10-CM

## 2014-07-22 NOTE — Patient Instructions (Signed)
Patient to return in 2 weeks  

## 2014-07-22 NOTE — Progress Notes (Signed)
Patient ID: Sandra Nguyen, female   DOB: 01/07/1971, 42 y.o.   MRN: [id number]  Chief Complaint  Patient presents with  . Routine Post Op    right  mastectomy    HPI Sandra Nguyen is a 42 y.o. female here today for her post op right mastectomy done on 07/10/14. Patient states she has a rash on her right arm since 07/19/14. Minimal response to topical creams and oral Benadryl. Slightly improved over last 24 hours by family report. No history of change in closing or detergent.   HPI  Past Medical History  Diagnosis Date  . Breast cancer March 2015     Clinical T1c, N0, BRCA negative  . 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 Leep  1990's  . Excisional hemorrhoidectomy  2015  . Breast biopsy Right 2015  . Breast surgery Right July 10, 2014    Right simple mastectomy with sentinel node biopsy.    Family History  Problem Relation Age of Onset  . Cancer Maternal Aunt     breast cancer, age greater than 70 at diagnosis  . Cancer Maternal Aunt     cervical    Social History History  Substance Use Topics  . Smoking status: Never Smoker   . Smokeless tobacco: Never Used  . Alcohol Use: No    No Known Allergies  Current Outpatient Prescriptions  Medication Sig Dispense Refill  . HYDROcodone-acetaminophen (NORCO/VICODIN) 5-325 MG per tablet       . KLOR-CON M20 20 MEQ tablet Take 20 mEq by mouth daily.       . traZODone (DESYREL) 100 MG tablet Take 100 mg by mouth as needed.        No current facility-administered medications for this visit.    Review of Systems Review of Systems  Constitutional: Negative.   Respiratory: Negative.   Cardiovascular: Negative.     Blood pressure 110/58, pulse 74, resp. rate 12, height 5' 2" (1.575 m), weight 111 lb (50.349 kg).  Physical Exam Physical Exam  Constitutional: She  is oriented to person, place, and time. She appears well-developed and well-nourished.  Cardiovascular: Normal rate, regular rhythm and normal heart sounds.   Pulmonary/Chest: Effort normal and breath sounds normal.  Right mastectomy site looks clean and healing well.  No evidence of loculated fluid.  Neurological: She is alert and oriented to person, place, and time.  Skin: Skin is warm and dry.       Data Reviewed No new data.  Assessment    Shoulder range of motion is improving. Cause a rash unclear. Poor sleep, likely due to minimal energy expenditure.     Plan    The patient will make use of a trial hydrocortisone cream to the rash and report her progress.  The need to increase her activity to perhaps provide better sleep (no response to melatonin, Benadryl, Ambien) was encouraged.  We'll plan on a followup examination in 2 weeks.     PCP: Vines, Dain    Byrnett, Jeffrey W 07/22/2014, 9:29 PM    

## 2014-08-05 ENCOUNTER — Ambulatory Visit: Payer: Self-pay | Admitting: Oncology

## 2014-08-07 ENCOUNTER — Encounter: Payer: Self-pay | Admitting: General Surgery

## 2014-08-07 ENCOUNTER — Ambulatory Visit (INDEPENDENT_AMBULATORY_CARE_PROVIDER_SITE_OTHER): Payer: Self-pay | Admitting: General Surgery

## 2014-08-07 VITALS — BP 110/62 | HR 72 | Resp 12 | Ht 62.0 in | Wt 116.0 lb

## 2014-08-07 DIAGNOSIS — C50419 Malignant neoplasm of upper-outer quadrant of unspecified female breast: Secondary | ICD-10-CM

## 2014-08-07 DIAGNOSIS — C50411 Malignant neoplasm of upper-outer quadrant of right female breast: Secondary | ICD-10-CM

## 2014-08-07 NOTE — Progress Notes (Signed)
Patient ID: Sandra Nguyen, female   DOB: 09/04/1971, 43 y.o.   MRN: [id number]  Chief Complaint  Patient presents with  . Routine Post Op    right mastectomy    HPI Sandra Nguyen is a 43 y.o. female today for her post op right mastectomy done on 07/10/14. Patient states she is doing good. Herceptin every 3 weeks. Nest appointment with Dr Oliva Bustard is for Sept 23 2015.  HPI  Past Medical History  Diagnosis Date  . Breast cancer March 2015     Clinical T1c, N0, BRCA negative  . 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 Leep  1990's  . Excisional hemorrhoidectomy  2015  . Breast biopsy Right 2015  . Breast surgery Right July 10, 2014    Right simple mastectomy with sentinel node biopsy.    Family History  Problem Relation Age of Onset  . Cancer Maternal Aunt     breast cancer, age greater than 72 at diagnosis  . Cancer Maternal Aunt     cervical    Social History History  Substance Use Topics  . Smoking status: Never Smoker   . Smokeless tobacco: Never Used  . Alcohol Use: No    No Known Allergies  Current Outpatient Prescriptions  Medication Sig Dispense Refill  . Cholecalciferol (VITAMIN D PO) Take 5,000 Units by mouth daily.       No current facility-administered medications for this visit.    Review of Systems Review of Systems  Constitutional: Negative.   Respiratory: Negative.   Cardiovascular: Negative.     Blood pressure 110/62, pulse 72, resp. rate 12, height '5\' 2"'  (1.575 m), weight 116 lb (52.617 kg), last menstrual period 04/02/2014.  Physical Exam Physical Exam  Constitutional: She is oriented to person, place, and time. She appears well-developed and well-nourished.  Eyes: Conjunctivae are normal. No scleral icterus.  Neck: Neck supple. No mass and no thyromegaly present.  Cardiovascular:  Normal rate, regular rhythm and intact distal pulses.   Pulmonary/Chest: Breath sounds normal.  Right mastectomy site looks clean and healing well.     Neurological: She is alert and oriented to person, place, and time.  Skin: Skin is warm and dry.  Excellent shoulder range of motion. No evidence of lymphedema.     Assessment    Doing well post mastectomy.     Plan    The patient still undecided about plans for reconstruction. She was encouraged to touch base with a plastic surgeon to see what her options are. This could probably be completed this year (to avoid a new deductible) with a brief interruption in her Herceptin treatment.      PCP/Ref: [name] Dr. Zenia Resides, Forest Gleason 08/08/2014, 7:25 PM

## 2014-08-07 NOTE — Patient Instructions (Addendum)
Patient to return three months.

## 2014-08-08 ENCOUNTER — Encounter: Payer: Self-pay | Admitting: General Surgery

## 2014-08-14 ENCOUNTER — Encounter: Payer: Self-pay | Admitting: *Deleted

## 2014-08-14 NOTE — Progress Notes (Signed)
Patient ID: Sandra Nguyen, female   DOB: 1971/05/08, 43 y.o.   MRN: [id number]  Records were forwarded to Dr. Edwin Dada office on 08-13-14 for MD review.  These records were received per [facility] on 08-14-14. She will be calling patient to arrange appointment day and time.

## 2014-08-27 LAB — CBC CANCER CENTER
BASOS PCT: 0.3 %
Basophil #: 0 x10 3/mm (ref 0.0–0.1)
EOS PCT: 4 %
Eosinophil #: 0.2 x10 3/mm (ref 0.0–0.7)
HCT: 30.9 % — AB (ref 35.0–47.0)
HGB: 10 g/dL — AB (ref 12.0–16.0)
Lymphocyte #: 2.3 x10 3/mm (ref 1.0–3.6)
Lymphocyte %: 40 %
MCH: 28.5 pg (ref 26.0–34.0)
MCHC: 32.4 g/dL (ref 32.0–36.0)
MCV: 88 fL (ref 80–100)
MONOS PCT: 11.5 %
Monocyte #: 0.7 x10 3/mm (ref 0.2–0.9)
NEUTROS PCT: 44.2 %
Neutrophil #: 2.5 x10 3/mm (ref 1.4–6.5)
Platelet: 292 x10 3/mm (ref 150–440)
RBC: 3.51 10*6/uL — ABNORMAL LOW (ref 3.80–5.20)
RDW: 13.1 % (ref 11.5–14.5)
WBC: 5.7 x10 3/mm (ref 3.6–11.0)

## 2014-08-27 LAB — COMPREHENSIVE METABOLIC PANEL
ALBUMIN: 3.3 g/dL — AB (ref 3.4–5.0)
ALK PHOS: 97 U/L
ALT: 70 U/L — AB
ANION GAP: 8 (ref 7–16)
BUN: 15 mg/dL (ref 7–18)
Bilirubin,Total: 0.2 mg/dL (ref 0.2–1.0)
CALCIUM: 8.9 mg/dL (ref 8.5–10.1)
Chloride: 103 mmol/L (ref 98–107)
Co2: 27 mmol/L (ref 21–32)
Creatinine: 0.93 mg/dL (ref 0.60–1.30)
EGFR (Non-African Amer.): >60
Glucose: 89 mg/dL (ref 65–99)
Osmolality: 276 (ref 275–301)
POTASSIUM: 3.4 mmol/L — AB (ref 3.5–5.1)
SGOT(AST): 55 U/L — ABNORMAL HIGH (ref 15–37)
Sodium: 138 mmol/L (ref 136–145)
Total Protein: 7 g/dL (ref 6.4–8.2)

## 2014-08-28 LAB — CANCER ANTIGEN 27.29: CA 27.29: 32.2 U/mL (ref 0.0–38.6)

## 2014-09-04 ENCOUNTER — Ambulatory Visit: Payer: Self-pay | Admitting: Oncology

## 2014-09-10 ENCOUNTER — Telehealth: Payer: Self-pay | Admitting: General Surgery

## 2014-09-10 ENCOUNTER — Telehealth: Payer: Self-pay | Admitting: *Deleted

## 2014-09-10 NOTE — Telephone Encounter (Signed)
Pt called to cancel her appt for tomorrow

## 2014-09-10 NOTE — Telephone Encounter (Signed)
09-10-14 PT CALLED STATING SHE WOKE UP THIS MORNING WITH ABD PAIN,HAD A BOWEL MOVEMENT WITH BRIGHT RED BLOOD. SHE STATES SHE BEGAN TAKEN TAMOXIFEN OB 08-31-14. SHE CALLED TO THE CANCER DOCTOR  ON CALL & THEY TOLD HER TO CALL DR Franchot Gallo

## 2014-09-10 NOTE — Telephone Encounter (Signed)
She states she was having normal brown Bm until this morning. She does not feel like it is a hemorrhoid. Denies rectal pain, just lower abdominal "crampy" pain, worse with standing. No fever, a little nausea. Appt for 09-11-14, pt agrees.

## 2014-09-10 NOTE — Telephone Encounter (Signed)
Pt called back and cancelled the appointment, states she is feeling much better.

## 2014-09-11 ENCOUNTER — Ambulatory Visit: Payer: Self-pay | Admitting: General Surgery

## 2014-10-02 ENCOUNTER — Telehealth: Payer: Self-pay | Admitting: *Deleted

## 2014-10-02 NOTE — Telephone Encounter (Signed)
Pt called and said that she was starting her Reconstruction on her breast  November 17th and she was wondering does she need to come in earlier for her 3 month f/u appt that is scheduled on December 3rd

## 2014-10-05 ENCOUNTER — Ambulatory Visit: Payer: Self-pay | Admitting: Oncology

## 2014-10-06 ENCOUNTER — Encounter: Payer: Self-pay | Admitting: General Surgery

## 2014-10-06 NOTE — Telephone Encounter (Signed)
Message left that if she is doing well and has no concerns, she can proceed w/ reconstruction on November 17th, and well keep her planned appointment in December. If she would be more comfortable with a visit pre-reconstruction, that will be arranged.

## 2014-10-06 NOTE — Telephone Encounter (Signed)
Pt called our office to say that she would be seeing Dr. Tula Nakayama on November 17th to start with reconstruction surgery. We discussed changing her November 06, 2014 appointment with Dr. Bary Castilla so that she could be seen before her appointment with Dr. Tula Nakayama. She stated that she was fine with keeping her December appointment, no need to move to earlier date.

## 2014-10-08 LAB — CBC CANCER CENTER
BASOS PCT: 0.2 %
Basophil #: 0 x10 3/mm (ref 0.0–0.1)
EOS PCT: 4.6 %
Eosinophil #: 0.2 x10 3/mm (ref 0.0–0.7)
HCT: 32.5 % — AB (ref 35.0–47.0)
HGB: 10.4 g/dL — ABNORMAL LOW (ref 12.0–16.0)
LYMPHS ABS: 2.5 x10 3/mm (ref 1.0–3.6)
LYMPHS PCT: 57.1 %
MCH: 27.9 pg (ref 26.0–34.0)
MCHC: 32.2 g/dL (ref 32.0–36.0)
MCV: 87 fL (ref 80–100)
Monocyte #: 0.3 x10 3/mm (ref 0.2–0.9)
Monocyte %: 7.6 %
Neutrophil #: 1.3 x10 3/mm — ABNORMAL LOW (ref 1.4–6.5)
Neutrophil %: 30.5 %
Platelet: 178 x10 3/mm (ref 150–440)
RBC: 3.74 10*6/uL — ABNORMAL LOW (ref 3.80–5.20)
RDW: 13.5 % (ref 11.5–14.5)
WBC: 4.3 x10 3/mm (ref 3.6–11.0)

## 2014-10-08 LAB — COMPREHENSIVE METABOLIC PANEL
AST: 29 U/L (ref 15–37)
Albumin: 3.6 g/dL (ref 3.4–5.0)
Alkaline Phosphatase: 71 U/L
Anion Gap: 6 — ABNORMAL LOW (ref 7–16)
BUN: 13 mg/dL (ref 7–18)
Bilirubin,Total: 0.2 mg/dL (ref 0.2–1.0)
Calcium, Total: 8.7 mg/dL (ref 8.5–10.1)
Chloride: 107 mmol/L (ref 98–107)
Co2: 27 mmol/L (ref 21–32)
Creatinine: 0.88 mg/dL (ref 0.60–1.30)
EGFR (African American): >60
EGFR (Non-African Amer.): >60
GLUCOSE: 80 mg/dL (ref 65–99)
OSMOLALITY: 278 (ref 275–301)
Potassium: 3.5 mmol/L (ref 3.5–5.1)
SGPT (ALT): 32 U/L
Sodium: 140 mmol/L (ref 136–145)
Total Protein: 7.2 g/dL (ref 6.4–8.2)

## 2014-10-09 LAB — CANCER ANTIGEN 27.29: CA 27.29: 31.7 U/mL (ref 0.0–38.6)

## 2014-10-21 ENCOUNTER — Ambulatory Visit: Payer: Self-pay

## 2014-11-04 ENCOUNTER — Ambulatory Visit: Payer: Self-pay | Admitting: Oncology

## 2014-11-06 ENCOUNTER — Ambulatory Visit (INDEPENDENT_AMBULATORY_CARE_PROVIDER_SITE_OTHER): Payer: BC Managed Care – PPO | Admitting: General Surgery

## 2014-11-06 VITALS — BP 120/76 | HR 76 | Resp 12 | Ht 62.0 in | Wt 121.0 lb

## 2014-11-06 DIAGNOSIS — C50411 Malignant neoplasm of upper-outer quadrant of right female breast: Secondary | ICD-10-CM

## 2014-11-06 NOTE — Patient Instructions (Signed)
The patient has been asked to return to the office in six months with a left diagnostic mammogram.  

## 2014-11-06 NOTE — Progress Notes (Signed)
Patient ID: Sandra Nguyen, female   DOB: 05-Jan-1971, 43 y.o.   MRN: [id number]  Chief Complaint  Patient presents with  . Follow-up    breast cancer    HPI Sandra Nguyen is a 43 y.o. female here today following up from her three month breast cancer. Patient states she had initial stage of the reconstruction on her right breast on November 17th.  HPI  Past Medical History  Diagnosis Date  . Breast cancer March 2015     Clinical T1c, N0, BRCA negative  . 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 Leep  1990's  . Excisional hemorrhoidectomy  2015  . Breast biopsy Right 2015  . Breast surgery Right July 10, 2014    Right simple mastectomy with sentinel node biopsy.    Family History  Problem Relation Age of Onset  . Cancer Maternal Aunt     breast cancer, age greater than 24 at diagnosis  . Cancer Maternal Aunt     cervical    Social History History  Substance Use Topics  . Smoking status: Never Smoker   . Smokeless tobacco: Never Used  . Alcohol Use: No    Allergies  Allergen Reactions  . Keflex [Cephalexin] Rash    Current Outpatient Prescriptions  Medication Sig Dispense Refill  . Vitamin D, Cholecalciferol, 1000 UNITS TABS Take by mouth.    . tamoxifen (NOLVADEX) 20 MG tablet Take 20 mg by mouth daily.     No current facility-administered medications for this visit.    Review of Systems Review of Systems  Constitutional: Negative.   Respiratory: Negative.   Cardiovascular: Negative.     Blood pressure 120/76, pulse 76, resp. rate 12, height '5\' 2"'  (1.575 m), weight 121 lb (54.885 kg).  Physical Exam Physical Exam  Constitutional: She is oriented to person, place, and time. She appears well-developed and well-nourished.  Eyes: Conjunctivae are normal. No scleral icterus.  Neck: Neck supple.   Cardiovascular: Normal rate, regular rhythm and normal heart sounds.   Pulmonary/Chest: Effort normal. Left breast exhibits no inverted nipple, no mass, no nipple discharge, no skin change and no tenderness.  Right breast incision status post implant placement shows good healing. Photo obtained and forwarded to her plastic surgeon.  Left breast is unremarkable on clinical exam.  Lymphadenopathy:    She has no cervical adenopathy.    She has no axillary adenopathy.  Neurological: She is alert and oriented to person, place, and time.  Skin: Skin is warm and dry.      Assessment    Doing well status post mastectomy sentinel node biopsy and initial stages of breast reconstruction. Scheduled to finish Herceptin therapy in 2016.    Plan   The patient has been asked to return to the office in six months with a left diagnostic mammogram.     PCP:  Lanice Shirts 11/07/2014, 8:49 PM

## 2014-11-19 LAB — COMPREHENSIVE METABOLIC PANEL
Albumin: 3.4 g/dL (ref 3.4–5.0)
Alkaline Phosphatase: 56 U/L
Anion Gap: 6 — ABNORMAL LOW (ref 7–16)
BUN: 13 mg/dL (ref 7–18)
Bilirubin,Total: 0.2 mg/dL (ref 0.2–1.0)
Calcium, Total: 8.4 mg/dL — ABNORMAL LOW (ref 8.5–10.1)
Chloride: 110 mmol/L — ABNORMAL HIGH (ref 98–107)
Co2: 25 mmol/L (ref 21–32)
Creatinine: 0.91 mg/dL (ref 0.60–1.30)
EGFR (African American): >60
Glucose: 121 mg/dL — ABNORMAL HIGH (ref 65–99)
Osmolality: 283 (ref 275–301)
POTASSIUM: 3.4 mmol/L — AB (ref 3.5–5.1)
SGOT(AST): 18 U/L (ref 15–37)
SGPT (ALT): 17 U/L
SODIUM: 141 mmol/L (ref 136–145)
TOTAL PROTEIN: 6.8 g/dL (ref 6.4–8.2)

## 2014-11-19 LAB — CBC CANCER CENTER
BASOS ABS: 0 x10 3/mm (ref 0.0–0.1)
BASOS PCT: 0.3 %
EOS ABS: 0.3 x10 3/mm (ref 0.0–0.7)
EOS PCT: 5.8 %
HCT: 29.6 % — AB (ref 35.0–47.0)
HGB: 9.5 g/dL — AB (ref 12.0–16.0)
LYMPHS ABS: 2.2 x10 3/mm (ref 1.0–3.6)
Lymphocyte %: 43.9 %
MCH: 28.1 pg (ref 26.0–34.0)
MCHC: 32 g/dL (ref 32.0–36.0)
MCV: 88 fL (ref 80–100)
MONOS PCT: 7.3 %
Monocyte #: 0.4 x10 3/mm (ref 0.2–0.9)
NEUTROS ABS: 2.2 x10 3/mm (ref 1.4–6.5)
Neutrophil %: 42.7 %
Platelet: 175 x10 3/mm (ref 150–440)
RBC: 3.36 10*6/uL — AB (ref 3.80–5.20)
RDW: 13.7 % (ref 11.5–14.5)
WBC: 5 x10 3/mm (ref 3.6–11.0)

## 2014-12-05 ENCOUNTER — Ambulatory Visit: Payer: Self-pay | Admitting: Oncology

## 2014-12-31 LAB — COMPREHENSIVE METABOLIC PANEL
ALT: 23 U/L (ref 14–63)
ANION GAP: 5 — AB (ref 7–16)
Albumin: 3.5 g/dL (ref 3.4–5.0)
Alkaline Phosphatase: 66 U/L (ref 46–116)
BILIRUBIN TOTAL: 0.2 mg/dL (ref 0.2–1.0)
BUN: 13 mg/dL (ref 7–18)
CHLORIDE: 110 mmol/L — AB (ref 98–107)
CO2: 28 mmol/L (ref 21–32)
Calcium, Total: 8.4 mg/dL — ABNORMAL LOW (ref 8.5–10.1)
Creatinine: 0.89 mg/dL (ref 0.60–1.30)
EGFR (African American): >60
Glucose: 124 mg/dL — ABNORMAL HIGH (ref 65–99)
OSMOLALITY: 287 (ref 275–301)
POTASSIUM: 3.5 mmol/L (ref 3.5–5.1)
SGOT(AST): 21 U/L (ref 15–37)
SODIUM: 143 mmol/L (ref 136–145)
Total Protein: 6.9 g/dL (ref 6.4–8.2)

## 2014-12-31 LAB — CBC CANCER CENTER
Basophil #: 0 x10 3/mm (ref 0.0–0.1)
Basophil %: 0.3 %
Eosinophil #: 0.2 x10 3/mm (ref 0.0–0.7)
Eosinophil %: 4.2 %
HCT: 32.7 % — AB (ref 35.0–47.0)
HGB: 10.4 g/dL — ABNORMAL LOW (ref 12.0–16.0)
LYMPHS ABS: 2.1 x10 3/mm (ref 1.0–3.6)
LYMPHS PCT: 46.2 %
MCH: 27.7 pg (ref 26.0–34.0)
MCHC: 31.8 g/dL — ABNORMAL LOW (ref 32.0–36.0)
MCV: 87 fL (ref 80–100)
MONOS PCT: 8.7 %
Monocyte #: 0.4 x10 3/mm (ref 0.2–0.9)
NEUTROS ABS: 1.9 x10 3/mm (ref 1.4–6.5)
Neutrophil %: 40.6 %
PLATELETS: 168 x10 3/mm (ref 150–440)
RBC: 3.77 10*6/uL — ABNORMAL LOW (ref 3.80–5.20)
RDW: 13.6 % (ref 11.5–14.5)
WBC: 4.6 x10 3/mm (ref 3.6–11.0)

## 2014-12-31 LAB — FERRITIN: FERRITIN (ARMC): 52 ng/mL (ref 8–388)

## 2014-12-31 LAB — IRON AND TIBC
IRON BIND. CAP.(TOTAL): 313 ug/dL (ref 250–450)
Iron Saturation: 33 %
Iron: 102 ug/dL (ref 50–170)
Unbound Iron-Bind.Cap.: 211 ug/dL

## 2015-01-05 ENCOUNTER — Ambulatory Visit: Payer: Self-pay | Admitting: Oncology

## 2015-02-03 ENCOUNTER — Ambulatory Visit
Admit: 2015-02-03 | Disposition: A | Discharge status: Home / Self Care | Payer: Self-pay | Attending: Oncology | Admitting: Oncology

## 2015-02-05 ENCOUNTER — Ambulatory Visit: Payer: Self-pay

## 2015-03-06 ENCOUNTER — Ambulatory Visit
Admit: 2015-03-06 | Disposition: A | Discharge status: Home / Self Care | Payer: Self-pay | Attending: Oncology | Admitting: Oncology

## 2015-03-12 ENCOUNTER — Telehealth: Payer: Self-pay | Admitting: *Deleted

## 2015-03-12 NOTE — Telephone Encounter (Signed)
Patient reports she finished herceptin yesterday, 03-11-15.  This patient was in the recalls with this office for June 2016 with a unilateral left diagnostic mammogram. Patient reports that Dr. Oliva Bustard already ordered mammogram and she had completed at [facility] on 02-02-15. Patient states Dr. Oliva Bustard wanted this prior to her reconstruction.   Patient had reconstruction completed by Dr. Nicholaus Bloom. The first phase was in November 2015 and she had the second phase completed February 05, 2015.

## 2015-03-16 ENCOUNTER — Encounter: Payer: Self-pay | Admitting: *Deleted

## 2015-03-28 NOTE — Op Note (Signed)
PATIENT NAME:  Sandra Nguyen, Sandra Nguyen MR#:  [id number] DATE OF BIRTH:  02-22-1971  DATE OF PROCEDURE:  07/10/2014  PREOPERATIVE DIAGNOSIS: Right breast cancer.   POSTOPERATIVE DIAGNOSIS: Right breast cancer.  OPERATIVE PROCEDURE: Right simple mastectomy with sentinel node biopsy.   SURGEON: Hervey Ard, MD   ANESTHESIA: General by LMA under Dr. Myra Gianotti.   ESTIMATED BLOOD LOSS: Less than 25 mL.   CLINICAL NOTE: This 44 year old woman was identified with invasive mammary carcinoma. She is undergoing neoadjuvant chemotherapy. She had desired mastectomy.   DESCRIPTION OF PROCEDURE: The patient was injected with technetium sulfur colloid prior to the procedure. Scanning through the axilla showed high counts in the 700 to 800 range.   After the induction of general anesthesia, 3 mL of a dilute solution of methylene blue, 2 mL saline and  1 mL methylene blue was injected in the subareolar plexus. The breast, chest wall and axilla were then prepped with ChloraPrep and draped. The transverse incision was outlined in elliptical fashion. The skin was incised sharply and the remaining dissection completed with electrocautery. The superior flap was elevated first and this was extended to just below the clavicle. The axillary envelope was entered and 2 hot blue nodes and 1 hot non-blue node in the apex of the axilla were identified and sent for pathologic exam, all reported as negative for macro metastatic disease by Delorse Lek, MD. The inferior flap was elevated down to but not past the inframammary fold. The breast was then removed from the underlying pectoralis muscle taking the fascia of that muscle with the specimen. Hemostasis was achieved with electrocautery. The area was irrigated with water and a single Blake drain brought out through the inferior medial flap and anchored in place with 3-0 nylon. The skin flaps were then approximated with running 2-0 Vicryl deep dermal suture. Benzoin and Steri-Strips  followed by Telfa, fluffed gauze, Kerlix and an Ace wrap were applied.   The patient tolerated the procedure well.  ____________________________ Robert Bellow, MD jwb:sb D: 07/11/2014 08:14:44 ET T: 07/11/2014 11:15:46 ET JOB#: [id number]  cc: Robert Bellow, MD, <Dictator> Martie Lee. Oliva Bustard, MD Navaya Wiatrek Amedeo Kinsman MD ELECTRONICALLY SIGNED 07/11/2014 21:17

## 2015-03-28 NOTE — Op Note (Signed)
PATIENT NAME:  Sandra Nguyen, MONCADA MR#:  [id number] DATE OF BIRTH:  04/06/1971  DATE OF PROCEDURE:  03/20/2014  PREOPERATIVE DIAGNOSIS:  Acute hemorrhoids with bleeding.   POSTOPERATIVE DIAGNOSIS:  Acute hemorrhoids with bleeding.  OPERATIVE PROCEDURE:  Hemorrhoidectomy.   OPERATING SURGEON:  Hervey Ard, M.D.   ANESTHESIA:  General by LMA, Exparel 20 mL local infiltration.   ESTIMATED BLOOD LOSS:  Less than 10 mL.   CLINICAL NOTE:  This 44 year old woman developed severe hemorrhoids within the first week after initiation of chemotherapy for breast cancer.  She was seen 48 hours ago at which time moderate bleeding inflamed and external hemorrhoids were identified with oozing from the surface.  No clot was amenable for drainage.  She was placed on a Medrol Dosepak and topical corticosteroid cream as well as oral analgesics.  She reports scant improvement over the last 48 hours and for that reason is brought to the Operating Room at this time for planned hemorrhoidectomy.   OPERATIVE NOTE:  With the patient under adequate general anesthesia and having received Invanz 1 gram intravenously, she was placed in the dorsal lithotomy position and the perineum prepped with Betadine solution and drape.  Exparel was infiltrated for postoperative analgesia.  There were prominent internal/external components at the 7 and 10 o'clock position and a prominent internal component at the 5 o'clock position.  The Harmonic scalpel was used to excise these areas.  The defect was then approximated with a running locking 3-0 chromic suture.  This was reinforced with 3-0 chromic figure-of-eight sutures as needed for final hemostasis.  No stenosis of the anus was appreciated.  A Vaseline gauze pack was placed for removal 30 minutes later in the recovery room.  The patient was taken to the recovery room in stable condition.     ____________________________ Robert Bellow, MD jwb:ea D: 03/20/2014 20:21:00  ET T: 03/21/2014 03:27:31 ET JOB#: [id number]  cc: Robert Bellow, MD, <Dictator> Martie Lee. Oliva Bustard, MD Robert Bellow, MD JEFFREY Amedeo Kinsman MD ELECTRONICALLY SIGNED 03/21/2014 7:05

## 2015-03-28 NOTE — Op Note (Signed)
PATIENT NAME:  Sandra Nguyen, Sandra Nguyen MR#:  [id number] DATE OF BIRTH:  [birth year]  DATE OF PROCEDURE:  02/28/2014  PREOPERATIVE DIAGNOSIS: Right breast cancer, need for central venous access.   POSTOPERATIVE DIAGNOSIS: Right breast cancer, need for central venous access.   OPERATIVE PROCEDURE: Left subclavian PowerPort placement with ultrasound and fluoroscopic guidance.   SURGEON: Robert Bellow, MD   ANESTHESIA: Attended local under Dr. Myra Gianotti, 1% Xylocaine plain 10 mL.   ESTIMATED BLOOD LOSS: Minimal.   CLINICAL NOTE: This 44 year old woman is felt to be a candidate for neoadjuvant chemotherapy based on her 2 positive tumor. Central venous access was requested by her treating oncologist.   OPERATIVE NOTE: The patient received Kefzol prior to the procedure. In Trendelenburg position, the left chest was prepped with ChloraPrep and draped. Under ultrasound guidance, the left subclavian vein was cannulated. The guidewire was advanced followed by the dilator system. The catheter was advanced and found to have curled in the right internal jugular orifice. Under fluoroscopy, this was repositioned to the junction of the SVC and right atrium. The port was placed in the left chest and anchored with two 3-0 nylon sutures to the deep tissue. The catheter easily irrigated and aspirated. The pocket was closed with 3-0 Vicryl running suture to the adipose layer and a running 4-0 Vicryl subcuticular suture to the skin. The port was flushed with 10 mL of saline. Benzoin Steri-Strips followed by Telfa and Tegaderm dressings were applied. An erect portable chest x-ray in the recovery room showed the catheter tip as described above and no evidence of pneumothorax. The patient tolerated the procedure well and was taken to the recovery room in stable condition.    ____________________________ Robert Bellow, MD jwb:lt D: 02/28/2014 36:12:24 ET T: 02/28/2014 23:31:20 ET JOB#: [id number]  cc: Robert Bellow,  MD, <Dictator> Martie Lee. Oliva Bustard, MD Bayyinah Dukeman Amedeo Kinsman MD ELECTRONICALLY SIGNED 03/03/2014 11:20

## 2015-04-08 ENCOUNTER — Encounter: Payer: Self-pay | Admitting: *Deleted

## 2015-04-13 ENCOUNTER — Encounter: Payer: Self-pay | Admitting: *Deleted

## 2015-04-13 NOTE — Anesthesia Preprocedure Evaluation (Addendum)
Anesthesia Evaluation  Patient identified by MRN, date of birth, ID band Patient awake    Airway Mallampati: II  TM Distance: >3 FB Neck ROM: Full    Dental   Pulmonary          Cardiovascular     Neuro/Psych    GI/Hepatic   Endo/Other    Renal/GU      Musculoskeletal   Abdominal   Peds  Hematology   Anesthesia Other Findings Breast CA  Reproductive/Obstetrics                            Anesthesia Physical Anesthesia Plan  ASA: II  Anesthesia Plan: MAC   Post-op Pain Management:    Induction: Intravenous  Airway Management Planned: Simple Face Mask  Additional Equipment:   Intra-op Plan:   Post-operative Plan:   Informed Consent: I have reviewed the patients History and Physical, chart, labs and discussed the procedure including the risks, benefits and alternatives for the proposed anesthesia with the patient or authorized representative who has indicated his/her understanding and acceptance.     Plan Discussed with: CRNA  Anesthesia Plan Comments:         Anesthesia Quick Evaluation

## 2015-04-13 NOTE — Discharge Instructions (Signed)

## 2015-04-14 ENCOUNTER — Ambulatory Visit: Payer: BC Managed Care – PPO | Admitting: Anesthesiology

## 2015-04-14 ENCOUNTER — Encounter
Admission: RE | Disposition: A | Discharge status: Home / Self Care | Payer: Self-pay | Source: Ambulatory Visit | Attending: Plastic Surgery

## 2015-04-14 ENCOUNTER — Ambulatory Visit
Admission: RE | Admit: 2015-04-14 | Discharge: 2015-04-14 | Disposition: A | Discharge status: Home / Self Care | Payer: BC Managed Care – PPO | Source: Ambulatory Visit | Attending: Plastic Surgery | Admitting: Plastic Surgery

## 2015-04-14 DIAGNOSIS — Z9011 Acquired absence of right breast and nipple: Secondary | ICD-10-CM | POA: Diagnosis not present

## 2015-04-14 DIAGNOSIS — Z853 Personal history of malignant neoplasm of breast: Secondary | ICD-10-CM | POA: Insufficient documentation

## 2015-04-14 HISTORY — PX: BREAST RECONSTRUCTION: SHX9

## 2015-04-14 SURGERY — AEROLA/NIPPLE RECONSTRUCTION WITH GRAFT
Anesthesia: Monitor Anesthesia Care | Laterality: Right | Wound class: Clean

## 2015-04-14 MED ORDER — VANCOMYCIN HCL IN DEXTROSE 1-5 GM/200ML-% IV SOLN
1000.0000 mg | Freq: Once | INTRAVENOUS | Status: AC
  Administered 2015-04-14: 1000 mg via INTRAVENOUS

## 2015-04-14 MED ORDER — MIDAZOLAM HCL 2 MG/2ML IJ SOLN: INTRAMUSCULAR | Status: DC | PRN

## 2015-04-14 MED ORDER — ONDANSETRON HCL 4 MG/2ML IJ SOLN: 4.0000 mg | Freq: Once | INTRAMUSCULAR | Status: DC | PRN

## 2015-04-14 MED ORDER — LIDOCAINE HCL (CARDIAC) 20 MG/ML IV SOLN
INTRAVENOUS | Status: DC | PRN
  Administered 2015-04-14: 50 mg via INTRAVENOUS

## 2015-04-14 MED ORDER — OXYCODONE HCL 5 MG/5ML PO SOLN: 5.0000 mg | Freq: Once | ORAL | Status: DC | PRN

## 2015-04-14 MED ORDER — ONDANSETRON HCL 4 MG/2ML IJ SOLN
INTRAMUSCULAR | Status: DC | PRN
  Administered 2015-04-14: 4 mg via INTRAVENOUS

## 2015-04-14 MED ORDER — MIDAZOLAM HCL 2 MG/2ML IJ SOLN
INTRAMUSCULAR | Status: DC | PRN
  Administered 2015-04-14: 2 mg via INTRAVENOUS

## 2015-04-14 MED ORDER — FENTANYL CITRATE (PF) 100 MCG/2ML IJ SOLN: 25.0000 ug | INTRAMUSCULAR | Status: DC | PRN

## 2015-04-14 MED ORDER — LACTATED RINGERS IV SOLN
INTRAVENOUS | Status: DC
  Administered 2015-04-14 (×2): via INTRAVENOUS

## 2015-04-14 MED ORDER — FENTANYL CITRATE (PF) 100 MCG/2ML IJ SOLN
INTRAMUSCULAR | Status: DC | PRN
  Administered 2015-04-14: 100 ug via INTRAVENOUS

## 2015-04-14 MED ORDER — BACITRACIN 500 UNIT/GM EX OINT
TOPICAL_OINTMENT | CUTANEOUS | Status: DC | PRN
  Administered 2015-04-14: 1 via TOPICAL

## 2015-04-14 MED ORDER — PROPOFOL INFUSION 10 MG/ML OPTIME
INTRAVENOUS | Status: DC | PRN
  Administered 2015-04-14: 100 ug/kg/min via INTRAVENOUS

## 2015-04-14 MED ORDER — PROPOFOL 10 MG/ML IV BOLUS
INTRAVENOUS | Status: DC | PRN
  Administered 2015-04-14: 50 mg via INTRAVENOUS

## 2015-04-14 MED ORDER — LIDOCAINE-EPINEPHRINE 1 %-1:100000 IJ SOLN
INTRAMUSCULAR | Status: DC | PRN
  Administered 2015-04-14: 10 mL

## 2015-04-14 MED ORDER — OXYCODONE HCL 5 MG PO TABS: 5.0000 mg | ORAL_TABLET | Freq: Once | ORAL | Status: DC | PRN

## 2015-04-14 MED ORDER — SODIUM CHLORIDE 0.9 % IV SOLN
INTRAVENOUS | Status: DC | PRN
  Administered 2015-04-14: 10:00:00 via INTRAVENOUS

## 2015-04-14 SURGICAL SUPPLY — 53 items
BACTOSHIELD CHG 4% 4OZ (MISCELLANEOUS) ×4
BASIN GRAD PLASTIC 32OZ STRL (MISCELLANEOUS) ×3 IMPLANT
BLADE SURG 15 STRL LF DISP TIS (BLADE) ×2 IMPLANT
BLADE SURG 15 STRL SS (BLADE) ×4
BLADE SURG SZ11 CARB STEEL (BLADE) IMPLANT
BNDG GAUZE 4.5X4.1 6PLY STRL (MISCELLANEOUS) IMPLANT
CANISTER SUCT 1200ML W/VALVE (MISCELLANEOUS) ×3 IMPLANT
CLOSURE WOUND 1/2 X4 (GAUZE/BANDAGES/DRESSINGS) ×1
CNTNR SPEC 2.5X3XGRAD LEK (MISCELLANEOUS) ×1
CONT SPEC 4OZ STER OR WHT (MISCELLANEOUS) ×2
CONTAINER SPEC 2.5X3XGRAD LEK (MISCELLANEOUS) ×1 IMPLANT
COVER LIGHT HANDLE FLEXIBLE (MISCELLANEOUS) ×3 IMPLANT
DRAPE CHEST BREAST 15X10 FENES (DRAPES) ×3 IMPLANT
DRAPE SHEET LG 3/4 BI-LAMINATE (DRAPES) ×3 IMPLANT
DRSG TEGADERM 4X4.75 (GAUZE/BANDAGES/DRESSINGS) ×12 IMPLANT
ELECT CAUTERY BLADE TIP 2.5 (TIP)
ELECT CAUTERY NEEDLE 2.0 MIC (NEEDLE) ×3 IMPLANT
ELECTRODE CAUTERY BLDE TIP 2.5 (TIP) IMPLANT
GAUZE SPONGE 4X4 12PLY STRL (GAUZE/BANDAGES/DRESSINGS) ×3 IMPLANT
GLOVE BIO SURGEON STRL SZ7.5 (GLOVE) ×6 IMPLANT
GOWN STRL REUS W/ TWL LRG LVL3 (GOWN DISPOSABLE) ×2 IMPLANT
GOWN STRL REUS W/TWL LRG LVL3 (GOWN DISPOSABLE) ×4
LIQUID BAND (GAUZE/BANDAGES/DRESSINGS) ×3 IMPLANT
MARKER SKIN SURG W/RULER VIO (MISCELLANEOUS) ×3 IMPLANT
NDL KEITH 2.78 (NEEDLE) IMPLANT
NEEDLE HYPO 25GX1X1/2 BEV (NEEDLE) ×3 IMPLANT
NS IRRIG 500ML POUR BTL (IV SOLUTION) ×3 IMPLANT
PACK BASIN MINOR ARMC (MISCELLANEOUS) ×3 IMPLANT
SCRUB CHG 4% DYNA-HEX 4OZ (MISCELLANEOUS) ×2 IMPLANT
SHELL NIPPLES SORE SOFT (MISCELLANEOUS) ×3 IMPLANT
SOL PREP PVP 2OZ (MISCELLANEOUS)
SOLUTION PREP PVP 2OZ (MISCELLANEOUS) IMPLANT
SPONGE LAP 18X18 5 PK (GAUZE/BANDAGES/DRESSINGS) IMPLANT
STAPLER SKIN PROX 35W (STAPLE) ×3 IMPLANT
STRAP BODY AND KNEE 60X3 (MISCELLANEOUS) ×3 IMPLANT
STRIP CLOSURE SKIN 1/2X4 (GAUZE/BANDAGES/DRESSINGS) ×2 IMPLANT
SUT MNCRL 4-0 (SUTURE) ×2
SUT MNCRL 4-0 27XMFL (SUTURE) ×1
SUT MON AB 3-0 SH 27 (SUTURE) IMPLANT
SUT PDS 2-0 27IN (SUTURE) IMPLANT
SUT PDS 3 0 CT 1 27 (SUTURE) IMPLANT
SUT PLAIN GUT FAST 5-0 (SUTURE) ×3 IMPLANT
SUT SILK 2 0 SH (SUTURE) IMPLANT
SUT VIC AB 3-0 SH 27 (SUTURE) ×2
SUT VIC AB 3-0 SH 27X BRD (SUTURE) ×1 IMPLANT
SUT VICRYL 3-0 CR8 SH (SUTURE) IMPLANT
SUTURE MNCRL 4-0 27XMF (SUTURE) ×1 IMPLANT
SUTURE PDS 2-0 II CT-1 (SUTURE) IMPLANT
SYR 20CC LL (SYRINGE) ×3 IMPLANT
SYR BULB IRRIG 60ML STRL (SYRINGE) ×3 IMPLANT
TOWEL OR 17X26 4PK STRL BLUE (TOWEL DISPOSABLE) ×3 IMPLANT
UNDERPAD 30X60 958B10 (PK) (MISCELLANEOUS) ×6 IMPLANT
WATER STERILE IRR 500ML POUR (IV SOLUTION) ×3 IMPLANT

## 2015-04-14 NOTE — H&P (Signed)
  The current H&P has been reviewed and updated. It will be scanned in at a later date.

## 2015-04-14 NOTE — Transfer of Care (Signed)
Immediate Anesthesia Transfer of Care Note  Patient: Sandra Nguyen  Procedure(s) Performed: Procedure(s): AEROLA/NIPPLE RECONSTRUCTION WITH GRAFT (Right)  Patient Location: PACU  Anesthesia Type: MAC  Level of Consciousness: awake, alert  and patient cooperative  Airway and Oxygen Therapy: Patient Spontanous Breathing and Patient connected to supplemental oxygen  Post-op Assessment: Post-op Vital signs reviewed, Patient's Cardiovascular Status Stable, Respiratory Function Stable, Patent Airway and No signs of Nausea or vomiting  Post-op Vital Signs: Reviewed and stable  Complications: No apparent anesthesia complications

## 2015-04-14 NOTE — Anesthesia Procedure Notes (Signed)
Procedure Name: MAC Performed by: Dejanique Ruehl Pre-anesthesia Checklist: Patient identified, Emergency Drugs available, Suction available, Patient being monitored and Timeout performed Patient Re-evaluated:Patient Re-evaluated prior to inductionOxygen Delivery Method: Simple face mask Placement Confirmation: positive ETCO2 and breath sounds checked- equal and bilateral       

## 2015-04-14 NOTE — Anesthesia Postprocedure Evaluation (Signed)
  Anesthesia Post-op Note  Patient: Sandra Nguyen  Procedure(s) Performed: Procedure(s): AEROLA/NIPPLE RECONSTRUCTION WITH GRAFT (Right)  Anesthesia type:MAC  Patient location: PACU  Post pain: Pain level controlled  Post assessment: Post-op Vital signs reviewed, Patient's Cardiovascular Status Stable, Respiratory Function Stable, Patent Airway and No signs of Nausea or vomiting  Post vital signs: Reviewed and stable  Last Vitals:  Filed Vitals:   04/14/15 1119  BP:   Pulse:   Temp: 36.2 C  Resp:     Level of consciousness: awake, alert  and patient cooperative  Complications: No apparent anesthesia complications

## 2015-04-15 ENCOUNTER — Encounter: Payer: Self-pay | Admitting: Plastic Surgery

## 2015-04-15 NOTE — Op Note (Signed)
Sandra Nguyen, Sandra Nguyen              ACCOUNT NO.:  [id number]  MEDICAL RECORD NO.:  [id number]  LOCATION:  [facility]                        FACILITY:  [facility]  PHYSICIAN:  Cleda Daub, MD       DATE OF BIRTH:  1971-04-25  DATE OF PROCEDURE:  04/14/2015 DATE OF DISCHARGE:  04/14/2015                              OPERATIVE REPORT   PREOPERATIVE DIAGNOSIS:  Right breast cancer, status post reconstruction with absent nipple.  POSTOPERATIVE DIAGNOSIS:  Right breast cancer, status post reconstruction with absent nipple.  PROCEDURE PERFORMED:  Right nipple-areolar reconstruction.  SURGEON:  Cleda Daub, MD  ANESTHESIA:  Local plus deep sedation.  EBL:  Less than 5 cc.  COMPLICATIONS:  None.  FINDINGS:  Good projection and position at completion of procedure.  STATEMENT OF NECESSITY:  [name] is a very pleasant female who has completed her first two stages of breast reconstruction and presents now for nipple-areolar reconstruction.  We have discussed several different types, and she has elected for local tissue rearrangement.  The risks, benefits and alternatives have been discussed with her, and she has requested the procedure as described.  She has assisted in the positioning for optimal symmetry.  DESCRIPTION OF PROCEDURE:  The patient was brought to the operating room awake, alert and comfortable, and placed on the OR table after preoperative markings, and after adequate sedation, the skin was infiltrated with 1% Lidocaine with epinephrine, and this was allowed to take effect, during which time she was prepped and draped in the usual sterile fashion.  A skate-type incision was made below the previous scar and the skin and subcutaneous fat were elevated off the deeper layer of tissue.  Hemostasis was maintained with very limited electrocautery. The wings were then brought together underneath the cap, which created a cylindrical nipple.  This was performed with interrupted 5-0  Monocryl suture.  3-0 Vicryl was then used to close the triangular donor site defect on either side of the nipple, and then a running 5-0 Monocryl completed the closure along the base and inset the superior portion of the cylinder into the donor site.  This created a very nice nipple. There was good hemostasis and no evidence of bleeding.  There was good perfusion.  The wound was dressed on either side with Dermabond and antibiotic ointment over top of the areola with a nipple protector.  Needle, sponge and lap counts were correct.  She tolerated the procedure well and was transferred to recovery in good condition.          ______________________________ Cleda Daub, MD     BSC/MEDQ  D:  04/15/2015  T:  04/15/2015  Job:  [id number]

## 2015-04-22 NOTE — Progress Notes (Unsigned)
LATE ENTRY  Pt was seen on 04/13/2015 at 0900.   Clinical Social Worker met with pt for ongoing support post treatment. Pt shared that her adjustment period without a coworker has been more difficult than she anticipated, however she expects to settle into her new role.   Pt shared that her Mother's Day was not has happy as she hoped. She is still struggling with her husband and the communication. CSW provided supportive counseling around this issue.   Pt also shared that her brother suffered a heart attack a week prior. Pt shared there feelings around not finding out until much later that everything was going on. Pt stated that it was triggering her because it felt the way as it did when her other brother passed away and she never said good-bye. Pt has since been managing her mother and father through this crisis as it has been difficult for them. Pt's brother has made recovery and is doing well. Pt stated that is it taking longer than anticipated to overcome this situation.   CSW provided supportive counseling around her current concerns. Next appt in 04/27/2015 at 0900.  Sarah McNulty, MSW, LCSW Clinical Social Worker  [phone] 

## 2015-05-01 ENCOUNTER — Other Ambulatory Visit: Payer: Self-pay | Admitting: *Deleted

## 2015-05-01 DIAGNOSIS — C50411 Malignant neoplasm of upper-outer quadrant of right female breast: Secondary | ICD-10-CM

## 2015-05-06 ENCOUNTER — Inpatient Hospital Stay: Payer: BC Managed Care – PPO

## 2015-05-06 ENCOUNTER — Inpatient Hospital Stay: Payer: BC Managed Care – PPO | Attending: Oncology

## 2015-05-06 ENCOUNTER — Inpatient Hospital Stay (HOSPITAL_BASED_OUTPATIENT_CLINIC_OR_DEPARTMENT_OTHER): Payer: BC Managed Care – PPO | Admitting: Oncology

## 2015-05-06 VITALS — BP 108/74 | HR 60 | Temp 95.9°F | Wt 129.6 lb

## 2015-05-06 DIAGNOSIS — Z170 Estrogen receptor positive status [ER+]: Secondary | ICD-10-CM

## 2015-05-06 DIAGNOSIS — Z9221 Personal history of antineoplastic chemotherapy: Secondary | ICD-10-CM

## 2015-05-06 DIAGNOSIS — C50411 Malignant neoplasm of upper-outer quadrant of right female breast: Secondary | ICD-10-CM | POA: Insufficient documentation

## 2015-05-06 DIAGNOSIS — Z79811 Long term (current) use of aromatase inhibitors: Secondary | ICD-10-CM | POA: Diagnosis not present

## 2015-05-06 DIAGNOSIS — Z79810 Long term (current) use of selective estrogen receptor modulators (SERMs): Secondary | ICD-10-CM | POA: Insufficient documentation

## 2015-05-06 LAB — CBC WITH DIFFERENTIAL/PLATELET
Basophils Absolute: 0 10*3/uL (ref 0–0.1)
Basophils Relative: 0 %
Eosinophils Absolute: 0.2 10*3/uL (ref 0–0.7)
Eosinophils Relative: 3 %
HCT: 32.1 % — ABNORMAL LOW (ref 35.0–47.0)
HEMOGLOBIN: 10.3 g/dL — AB (ref 12.0–16.0)
LYMPHS ABS: 2.2 10*3/uL (ref 1.0–3.6)
Lymphocytes Relative: 42 %
MCH: 27.6 pg (ref 26.0–34.0)
MCHC: 32.1 g/dL (ref 32.0–36.0)
MCV: 85.9 fL (ref 80.0–100.0)
Monocytes Absolute: 0.4 10*3/uL (ref 0.2–0.9)
Monocytes Relative: 9 %
Neutro Abs: 2.3 10*3/uL (ref 1.4–6.5)
Neutrophils Relative %: 46 %
Platelets: 251 10*3/uL (ref 150–440)
RBC: 3.74 MIL/uL — AB (ref 3.80–5.20)
RDW: 13 % (ref 11.5–14.5)
WBC: 5.1 10*3/uL (ref 3.6–11.0)

## 2015-05-06 LAB — COMPREHENSIVE METABOLIC PANEL
ALT: 13 U/L — ABNORMAL LOW (ref 14–54)
AST: 24 U/L (ref 15–41)
Albumin: 3.8 g/dL (ref 3.5–5.0)
Alkaline Phosphatase: 51 U/L (ref 38–126)
Anion gap: 5 (ref 5–15)
BUN: 17 mg/dL (ref 6–20)
CO2: 24 mmol/L (ref 22–32)
CREATININE: 0.77 mg/dL (ref 0.44–1.00)
Calcium: 8.6 mg/dL — ABNORMAL LOW (ref 8.9–10.3)
Chloride: 106 mmol/L (ref 101–111)
GFR calc Af Amer: >60 mL/min (ref >60)
GFR calc non Af Amer: >60 mL/min (ref >60)
Glucose, Bld: 93 mg/dL (ref 65–99)
Potassium: 3.7 mmol/L (ref 3.5–5.1)
Sodium: 135 mmol/L (ref 135–145)
TOTAL PROTEIN: 6.9 g/dL (ref 6.5–8.1)
Total Bilirubin: 0.2 mg/dL — ABNORMAL LOW (ref 0.3–1.2)

## 2015-05-06 MED ORDER — FERROUS FUMARATE 325 (106 FE) MG PO TABS: 1.0000 | ORAL_TABLET | Freq: Every day | ORAL | Status: DC

## 2015-05-06 MED ORDER — SODIUM CHLORIDE 0.9 % IJ SOLN
10.0000 mL | INTRAMUSCULAR | Status: DC | PRN
  Administered 2015-05-06: 10 mL
  Filled 2015-05-06: qty 10

## 2015-05-06 MED ORDER — HEPARIN SOD (PORK) LOCK FLUSH 100 UNIT/ML IV SOLN
500.0000 [IU] | Freq: Once | INTRAVENOUS | Status: AC
  Administered 2015-05-06: 500 [IU] via INTRAVENOUS

## 2015-05-06 NOTE — Progress Notes (Signed)
Pt has never smoked.  Does not have living will.

## 2015-05-09 ENCOUNTER — Encounter: Payer: Self-pay | Admitting: Oncology

## 2015-05-09 NOTE — Progress Notes (Signed)
[place] @ [facility] [phone]  Fax:([phone]) [place]: 1971-11-15  MR#: [id number]  DVV#:[id number]  Patient Care Team: [name], MD as PCP - General (Family Medicine) [name] Dear, MD (Family Medicine) Robert Bellow, MD (General Surgery) Forest Gleason, MD (Unknown Physician Specialty)  CHIEF COMPLAINT:  Chief Complaint  Patient presents with  . Follow-up    Oncology History   1. Carcinoma breast (right breast) 2 cm palpable mass Invasive mammary carcinoma.  Estrogen receptor positive.  Progesterone receptor positive.  HER-2/neu receptor amplified. 2. Started on neoadjuvant chemotherapy,  Taxotere, carboplatin and Herceptin and perjeta (March 05, 2014) 3. Patient does not have any clinically significant BRCA mutation Has been on and to [place] Mutation of unknown significance. . 4. 4 cycles of chemotherapy with TCH,perjeta previous did not June of 201 5.  Because of progressing side effect remaining 2 cycles of chemotherapy was not given but patient continued Herceptin and aperjeta  5. Status post right breast simple mastectomy in August of 2015 ypT1  ypN0 stage IB 6.patient started on tamoxifen in October of 2015 along with maintenance Herceptin therapy Patient is going for reconstructive surgery on October 21, 2014 final reconstructive surgery was done in February of 2016 [name] finished Herceptin therapy on March 11, 2015     Breast cancer of upper-outer quadrant of right female breast   02/18/2014 Initial Diagnosis Breast cancer of upper-outer quadrant of right female breast    Oncology Flowsheet 04/14/2015  ondansetron (ZOFRAN) IV -    INTERVAL HISTORY:  44 year old lady with a history of carcinoma of breast status post mastectomy reconstruction.  Here for further follow-up.  Desires to get port removed.  Since last evaluation patient did not develop any significant nausea vomiting diarrhea no bony pains.  Taking tamoxifen and  tolerating treatment very well.  Getting regular pelvic examination done REVIEW OF SYSTEMS:   GENERAL:  Feels good.  Active.  No fevers, sweats or weight loss. PERFORMANCE STATUS (ECOG):  0 HEENT:  No visual changes, runny nose, sore throat, mouth sores or tenderness. Lungs: No shortness of breath or cough.  No hemoptysis. Cardiac:  No chest pain, palpitations, orthopnea, or PND. GI:  No nausea, vomiting, diarrhea, constipation, melena or hematochezia. GU:  No urgency, frequency, dysuria, or hematuria. Musculoskeletal:  No back pain.  No joint pain.  No muscle tenderness. Extremities:  No pain or swelling. Skin:  No rashes or skin changes. Neuro:  No headache, numbness or weakness, balance or coordination issues. Endocrine:  No diabetes, thyroid issues, hot flashes or night sweats. Psych:  No mood changes, depression or anxiety. Pain:  No focal pain. Review of systems:  All other systems reviewed and found to be negative. As per HPI. Otherwise, a complete review of systems is negatve.  PAST MEDICAL HISTORY: Past Medical History  Diagnosis Date  . Breast cancer March 2015     Clinical T1c, N0, BRCA negative  . 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 Anemia   . Migraines     PAST SURGICAL HISTORY: Past Surgical History  Procedure Laterality Date  . Leep  1990's  . Excisional hemorrhoidectomy  2015  . Breast biopsy Right 2015  . Breast surgery Right July 10, 2014    Right simple mastectomy with sentinel node biopsy.  . Breast reconstruction Right 04/14/2015    Procedure: AEROLA/NIPPLE RECONSTRUCTION WITH GRAFT;  Surgeon:  Nicholaus Bloom, MD;  Location: [place];  Service: Plastics;  Laterality: Right;    FAMILY HISTORY Family History  Problem Relation Age of Onset  . Cancer Maternal Aunt     breast cancer, age greater than 14 at diagnosis  . Cancer Maternal Aunt      cervical    ADVANCED DIRECTIVES:  Patient does not have any advanced healthcare directive. Information has been given.  HEALTH MAINTENANCE: History  Substance Use Topics  . Smoking status: Never Smoker   . Smokeless tobacco: Never Used  . Alcohol Use: No      Allergies  Allergen Reactions  . Keflex [Cephalexin] Rash    Current Outpatient Prescriptions  Medication Sig Dispense Refill  . BIOTIN 5000 PO Take 5,000 mg by mouth daily. 7 PM    . clonazePAM (KLONOPIN) 0.5 MG tablet Take 0.5 mg by mouth at bedtime as needed for anxiety.    . tamoxifen (NOLVADEX) 20 MG tablet Take 20 mg by mouth daily. 7 PM    . ferrous fumarate (HEMOCYTE - 106 MG FE) 325 (106 FE) MG TABS tablet Take 1 tablet (106 mg of iron total) by mouth daily. 30 each 3  . levocetirizine (XYZAL) 5 MG tablet Take 5 mg by mouth daily.  5  . Vitamin D, Cholecalciferol, 1000 UNITS TABS Take by mouth. 7 PM     No current facility-administered medications for this visit.    OBJECTIVE:  Filed Vitals:   05/06/15 1442  BP: 108/74  Pulse: 60  Temp: 95.9 F (35.5 C)     Body mass index is 22.97 kg/(m^2).    ECOG FS:0 - Asymptomatic  PHYSICAL EXAM: General  status: Performance status is good.  Patient has not lost significant weight HEENT: No evidence of stomatitis. Sclera and conjunctivae :: No jaundice.   pale looking. Lungs: Air  entry equal on both sides.  No rhonchi.  No rales.  Cardiac: Heart sounds are normal.  No pericardial rub.  No murmur. Lymphatic system: Cervical, axillary, inguinal, lymph nodes not palpable GI: Abdomen is soft.  No ascites.  Liver spleen not palpable.  No tenderness.  Bowel sounds are within normal limit Lower extremity: No edema Neurological system: Higher functions, cranial nerves intact no evidence of peripheral neuropathy. Skin: No rash.  No ecchymosis.. Examination of the right breast reconstruction no palpable lymphadenopathy Breast free of masses   LAB RESULTS:  Infusion  on 05/06/2015  Component Date Value Ref Range Status  . WBC 05/06/2015 5.1  3.6 - 11.0 K/uL Final  . RBC 05/06/2015 3.74* 3.80 - 5.20 MIL/uL Final  . Hemoglobin 05/06/2015 10.3* 12.0 - 16.0 g/dL Final  . HCT 05/06/2015 32.1* 35.0 - 47.0 % Final  . MCV 05/06/2015 85.9  80.0 - 100.0 fL Final  . MCH 05/06/2015 27.6  26.0 - 34.0 pg Final  . MCHC 05/06/2015 32.1  32.0 - 36.0 g/dL Final  . RDW 05/06/2015 13.0  11.5 - 14.5 % Final  . Platelets 05/06/2015 251  150 - 440 K/uL Final  . Neutrophils Relative % 05/06/2015 46   Final  . Neutro Abs 05/06/2015 2.3  1.4 - 6.5 K/uL Final  . Lymphocytes Relative 05/06/2015 42   Final  . Lymphs Abs 05/06/2015 2.2  1.0 - 3.6 K/uL Final  . Monocytes Relative 05/06/2015 9   Final  . Monocytes Absolute 05/06/2015 0.4  0.2 - 0.9 K/uL Final  . Eosinophils Relative 05/06/2015 3   Final  . Eosinophils Absolute 05/06/2015 0.2  0 - 0.7 K/uL Final  . Basophils Relative 05/06/2015 0   Final  . Basophils Absolute 05/06/2015 0.0  0 - 0.1 K/uL Final  . Sodium 05/06/2015 135  135 - 145 mmol/L Final  . Potassium 05/06/2015 3.7  3.5 - 5.1 mmol/L Final  . Chloride 05/06/2015 106  101 - 111 mmol/L Final  . CO2 05/06/2015 24  22 - 32 mmol/L Final  . Glucose, Bld 05/06/2015 93  65 - 99 mg/dL Final  . BUN 05/06/2015 17  6 - 20 mg/dL Final  . Creatinine, Ser 05/06/2015 0.77  0.44 - 1.00 mg/dL Final  . Calcium 05/06/2015 8.6* 8.9 - 10.3 mg/dL Final  . Total Protein 05/06/2015 6.9  6.5 - 8.1 g/dL Final  . Albumin 05/06/2015 3.8  3.5 - 5.0 g/dL Final  . AST 05/06/2015 24  15 - 41 U/L Final  . ALT 05/06/2015 13* 14 - 54 U/L Final  . Alkaline Phosphatase 05/06/2015 51  38 - 126 U/L Final  . Total Bilirubin 05/06/2015 0.2* 0.3 - 1.2 mg/dL Final  . GFR calc non Af Amer 05/06/2015 >60  >60 mL/min Final  . GFR calc Af Amer 05/06/2015 >60  >60 mL/min Final   Comment: (NOTE) The eGFR has been calculated using the CKD EPI equation. This calculation has not been validated in all  clinical situations. eGFR's persistently <60 mL/min signify possible Chronic Kidney Disease.   . Anion gap 05/06/2015 5  5 - 15 Final    Lab Results  Component Value Date   LABCA2 31.7 10/08/2014    STUDIES: No results found.  ASSESSMENT: Carcinoma of breast status post chemotherapy and now on tamoxifen.  MEDICAL DECISION MAKING:  Lab data has been reviewed there is no evidence of recurrent or progressive disease  Patient expressed understanding and was in agreement with this plan. She also understands that She can call clinic at any time with any questions, concerns, or complaints.    Breast cancer of upper-outer quadrant of right female breast   Staging form: Breast, AJCC 7th Edition     Clinical: Stage IIA (T2, N0, M0) - Signed by Forest Gleason, MD on 05/09/2015   Forest Gleason, MD   05/09/2015 4:47 PM

## 2015-05-18 ENCOUNTER — Encounter: Payer: Self-pay | Admitting: *Deleted

## 2015-05-18 NOTE — Progress Notes (Unsigned)
Clinical Social Worker met with pt for ongoing supportive counseling. Pt shared issues that are going on her personal life, specifically her marriage. Pt shared that she was not accepted to a school where she applied. Pt also shared the loss of a friend, who was diagnosised with Stage IV cancer and was only 46 years old.   CSW provided supportive counseling and problem solving around the expressed issues. Next appt is 05/25/2015 at 0900.   Darden Dates, MSW, LCSW Clinical Social Worker [phone]

## 2015-05-19 ENCOUNTER — Encounter: Payer: Self-pay | Admitting: General Surgery

## 2015-05-19 ENCOUNTER — Ambulatory Visit (INDEPENDENT_AMBULATORY_CARE_PROVIDER_SITE_OTHER): Payer: BC Managed Care – PPO | Admitting: General Surgery

## 2015-05-19 VITALS — BP 120/58 | HR 64 | Resp 12 | Ht 62.5 in | Wt 131.0 lb

## 2015-05-19 DIAGNOSIS — C50411 Malignant neoplasm of upper-outer quadrant of right female breast: Secondary | ICD-10-CM

## 2015-05-19 NOTE — Patient Instructions (Addendum)
Ice pack for comfort Nurse visit next week

## 2015-05-19 NOTE — Progress Notes (Signed)
Patient ID: Sandra Nguyen, female   DOB: 02/10/1971, 44 y.o.   MRN: [id number]  Chief Complaint  Patient presents with  . Procedure    HPI Sandra Nguyen is a 44 y.o. female.  Here today for port removal. She is accompanied by her husband and her mother.   HPI  Past Medical History  Diagnosis Date  . Breast cancer March 2015     Clinical T1c, N0, BRCA negative  . 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 Anemia   . Migraines     Past Surgical History  Procedure Laterality Date  . Leep  1990's  . Excisional hemorrhoidectomy  2015  . Breast biopsy Right 2015  . Breast surgery Right July 10, 2014    Right simple mastectomy with sentinel node biopsy.  . Breast reconstruction Right 04/14/2015    Procedure: AEROLA/NIPPLE RECONSTRUCTION WITH GRAFT;  Surgeon: Nicholaus Bloom, MD;  Location: [place];  Service: Plastics;  Laterality: Right;    Family History  Problem Relation Age of Onset  . Cancer Maternal Aunt     breast cancer, age greater than 33 at diagnosis  . Cancer Maternal Aunt     cervical    Social History History  Substance Use Topics  . Smoking status: Never Smoker   . Smokeless tobacco: Never Used  . Alcohol Use: No    Allergies  Allergen Reactions  . Keflex [Cephalexin] Rash    Current Outpatient Prescriptions  Medication Sig Dispense Refill  . BIOTIN 5000 PO Take 5,000 mg by mouth daily. 7 PM    . clonazePAM (KLONOPIN) 0.5 MG tablet Take 0.5 mg by mouth at bedtime as needed for anxiety.    . ferrous fumarate (HEMOCYTE - 106 MG FE) 325 (106 FE) MG TABS tablet Take 1 tablet (106 mg of iron total) by mouth daily. 30 each 3  . levocetirizine (XYZAL) 5 MG tablet Take 5 mg by mouth daily.  5  . tamoxifen (NOLVADEX) 20 MG tablet Take 20 mg by mouth daily. 7 PM    . Vitamin D, Cholecalciferol, 1000 UNITS TABS Take by mouth. 7 PM     No current  facility-administered medications for this visit.    Review of Systems Review of Systems  Constitutional: Negative.   Respiratory: Positive for cough.   Cardiovascular: Negative.     Blood pressure 120/58, pulse 64, resp. rate 12, height 5' 2.5" (1.588 m), weight 131 lb (59.421 kg).  Physical Exam Physical Exam  Pulmonary/Chest:      Data Reviewed Permission for port removal received for medical oncology.  Assessment    Candidate for PowerPort removal.    Plan    The procedure was reviewed and the patient was amenable to proceed. 10 mL of 0.5% Xylocaine with 0.25% Marcaine with 1-200,000 units of epinephrine was utilized well tolerated. This was supplemented with 3 mL of 1% plain Xylocaine. The area was prepped with chlor prep and drape. The original incision was opened. The 3 proline sutures holding the port to the deep tissue were removed. The port was extracted with an intact catheter tip without incident. The wound was closed in layers with 3-0 Vicryls to the adipose tissue and a running 4-0 Vicryls septic suture for the skin. Benzoin, Steri-Strips, Telfa and Tegaderm dressing applied.  The procedure was well tolerated.  The patient was given a prescription for Norco 5/325, #30 with the inscription  1-2 by mouth every 4 hours when necessary for pain. No refills.  She'll return next week for wound evaluation with the staff.      PCP:  Lanice Shirts 05/20/2015, 6:21 PM

## 2015-05-26 ENCOUNTER — Ambulatory Visit (INDEPENDENT_AMBULATORY_CARE_PROVIDER_SITE_OTHER): Payer: BC Managed Care – PPO | Admitting: *Deleted

## 2015-05-26 DIAGNOSIS — C50411 Malignant neoplasm of upper-outer quadrant of right female breast: Secondary | ICD-10-CM

## 2015-05-26 NOTE — Progress Notes (Signed)
Patient came in today for a wound check.  The wound is clean, with no signs of infection noted. .Follow up as scheduled.  

## 2015-05-27 ENCOUNTER — Encounter: Payer: Self-pay | Admitting: *Deleted

## 2015-05-27 NOTE — Progress Notes (Unsigned)
Clinical Social Worker met with pt for ongoing supportive counseling. Pt reported that she has applied to a school to complete for undergraduate degree. Pt is hopeful that she will start classes in August.Pt had her port removed last week. Pt shared a recent struggle with a co-worker, CSW and pt processed the issue. CSW provided supportive counseling. Next appt is 06/15/2015 at 0900.   Darden Dates, MSW, LCSW Clinical Social Worker  [phone]

## 2015-06-15 ENCOUNTER — Encounter: Payer: Self-pay | Admitting: *Deleted

## 2015-06-17 NOTE — Progress Notes (Unsigned)
Pt was seen on 06/15/2015 at 0900.   Clinical Social Worker met with pt for ongoing support post-treatment. Pt and CSW addressed the approach of the 3rd anniversary of pt's brother death. CSW provided supportive counseling around this as pt's family does not have closure. CSW and pt also discussed the current events and how it is impacting her family. CSW will continue to follow. Next appt is 06/22/2015 at 0900.  Darden Dates, MSW, LCSW Clinical Social Worker  [phone]

## 2015-06-22 ENCOUNTER — Encounter: Payer: Self-pay | Admitting: *Deleted

## 2015-06-22 NOTE — Progress Notes (Unsigned)
Clinical Social Worker met with pt for on going support post treatment. Pt and CSW processed the 3rd year anniversary of her brother's death. Pt also shared that current issues that are impacting her marriage. Pt reported that she has been gaining weight as a side effect of the medication she is on. Pt is interested in seeing a nutritionist. CSW will follow up with NP or Breast Navigator for referral. CSW provided supportive counseling. Next appt is 06/29/2015 at 0900.   Darden Dates, MSW, LCSW Clinical Social Worker [phone]

## 2015-06-29 ENCOUNTER — Other Ambulatory Visit: Payer: BC Managed Care – PPO

## 2015-06-29 ENCOUNTER — Encounter: Payer: Self-pay | Admitting: *Deleted

## 2015-06-29 ENCOUNTER — Encounter: Payer: Self-pay | Admitting: General Surgery

## 2015-06-29 ENCOUNTER — Ambulatory Visit (INDEPENDENT_AMBULATORY_CARE_PROVIDER_SITE_OTHER): Payer: BC Managed Care – PPO | Admitting: General Surgery

## 2015-06-29 VITALS — BP 118/68 | HR 74 | Resp 12 | Ht 62.0 in | Wt 137.0 lb

## 2015-06-29 DIAGNOSIS — N6320 Unspecified lump in the left breast, unspecified quadrant: Secondary | ICD-10-CM

## 2015-06-29 DIAGNOSIS — N63 Unspecified lump in breast: Secondary | ICD-10-CM | POA: Diagnosis not present

## 2015-06-29 NOTE — Progress Notes (Unsigned)
Clinical Social Worker met with pt for ongoing support post treatment. Pt reported that she found a new lump in her breast on Friday and is seeing MD this afternoon. CSW and pt processed the feeling emerging from this discovery and what her plan is. CSW provided supportive counseling to pt around her fears.  CSW will continue to follow. Next appt is August 1 at 9 AM.   Sarah McNulty, MSW, LCSW Clinical Social Worker [phone] 

## 2015-06-29 NOTE — Progress Notes (Signed)
Patient ID: Sandra Nguyen, female   DOB: 12/09/1970, 44 y.o.   MRN: [id number]  Chief Complaint  Patient presents with  . Other    left breast lump    HPI Sandra Nguyen is a 44 y.o. female here today for a evaluation of a left breast nodular . Patient states she felt a lump in her left breast on 06/26/15. She states it is the size of a Pharmacist, community. HPI  Past Medical History  Diagnosis Date  . Breast cancer March 2015     Clinical T1c, N0, BRCA negative  . 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 Anemia   . Migraines     Past Surgical History  Procedure Laterality Date  . Leep  1990's  . Excisional hemorrhoidectomy  2015  . Breast biopsy Right 2015  . Breast surgery Right July 10, 2014    Right simple mastectomy with sentinel node biopsy.  . Breast reconstruction Right 04/14/2015    Procedure: AEROLA/NIPPLE RECONSTRUCTION WITH GRAFT;  Surgeon: Nicholaus Bloom, MD;  Location: [place];  Service: Plastics;  Laterality: Right;    Family History  Problem Relation Age of Onset  . Cancer Maternal Aunt     breast cancer, age greater than 36 at diagnosis  . Cancer Maternal Aunt     cervical    Social History History  Substance Use Topics  . Smoking status: Never Smoker   . Smokeless tobacco: Never Used  . Alcohol Use: No    Allergies  Allergen Reactions  . Keflex [Cephalexin] Rash    Current Outpatient Prescriptions  Medication Sig Dispense Refill  . BIOTIN 5000 PO Take 5,000 mg by mouth daily. 7 PM    . clonazePAM (KLONOPIN) 0.5 MG tablet Take 0.5 mg by mouth at bedtime as needed for anxiety.    . ferrous fumarate (HEMOCYTE - 106 MG FE) 325 (106 FE) MG TABS tablet Take 1 tablet (106 mg of iron total) by mouth daily. 30 each 3  . levocetirizine (XYZAL) 5 MG tablet Take 5 mg by mouth daily.  5  . tamoxifen (NOLVADEX) 20 MG tablet Take 20 mg by mouth daily.  7 PM    . Vitamin D, Cholecalciferol, 1000 UNITS TABS Take by mouth. 7 PM     No current facility-administered medications for this visit.    Review of Systems Review of Systems  Constitutional: Negative.   Respiratory: Negative.   Cardiovascular: Negative.     Blood pressure 118/68, pulse 74, resp. rate 12, height '5\' 2"'  (1.575 m), weight 137 lb (62.143 kg).  Physical Exam Physical Exam  Constitutional: She is oriented to person, place, and time. She appears well-developed and well-nourished.  Eyes: Conjunctivae are normal. No scleral icterus.  Neck: Neck supple.  Cardiovascular:  Murmur heard.  Systolic murmur is present with a grade of 2/6  Pulmonary/Chest: Right breast exhibits no inverted nipple, no mass, no nipple discharge, no skin change and no tenderness. Left breast exhibits no inverted nipple, no nipple discharge, no skin change and no tenderness.    reconstuction right breast well healed.   Lymphadenopathy:    She has no cervical adenopathy.  Neurological: She is alert and oriented to person, place, and time.  Skin: Skin is warm and dry.    Data Reviewed Ultrasound examination of the left breast in the area of palpable fullness in the lower inner quadrant showed the underlying  prosthesis to be smoothly marginated with the overlying pectoralis muscle intact. No discernible soft tissue lesion. No overlying breast parenchyma in this location above the prosthesis. BI-RADS-1.  Assessment    Benign exam. No evidence of implant dysfunction or rupture.    Plan    The patient was reassured there is no evidence of new malignancy or implant dysfunction.     Return in February 2017 left diagnotic mammogram PCP:  Shela Leff 06/29/2015, 3:13 PM

## 2015-06-29 NOTE — Patient Instructions (Signed)
Return February 2017 left diagnotic mammogram

## 2015-06-30 DIAGNOSIS — N6320 Unspecified lump in the left breast, unspecified quadrant: Secondary | ICD-10-CM | POA: Insufficient documentation

## 2015-07-20 ENCOUNTER — Encounter: Payer: Self-pay | Admitting: *Deleted

## 2015-07-20 NOTE — Progress Notes (Unsigned)
Clinical Social Worker met with pt for ongoing support post treatment. Pt is pleased to shared that the new nodule, is part of her implant. Pt starts classes next week and is looking forward to it. Since CSW is going on leave, CSW and pt processed the work since pt has been seeking services. CSW will follow up with NP regardin referral to dietitian. No appt is scheduled at this time, CSW will reach out to pt when leave it over.   Darden Dates, MSW, LCSW Clinical Social Worker  [phone]

## 2015-08-19 ENCOUNTER — Ambulatory Visit: Payer: Self-pay | Admitting: General Surgery

## 2015-09-25 ENCOUNTER — Telehealth: Payer: Self-pay | Admitting: *Deleted

## 2015-09-25 MED ORDER — TAMOXIFEN CITRATE 20 MG PO TABS: 20.0000 mg | ORAL_TABLET | Freq: Every day | ORAL | Status: DC

## 2015-09-25 NOTE — Telephone Encounter (Signed)
90 day supply tamoxifen escribed to [facility] in [place].

## 2015-10-06 HISTORY — PX: AUGMENTATION MAMMAPLASTY: SUR837

## 2015-11-05 ENCOUNTER — Encounter: Payer: Self-pay | Admitting: Oncology

## 2015-11-05 ENCOUNTER — Inpatient Hospital Stay: Payer: BC Managed Care – PPO | Attending: Oncology

## 2015-11-05 ENCOUNTER — Inpatient Hospital Stay (HOSPITAL_BASED_OUTPATIENT_CLINIC_OR_DEPARTMENT_OTHER): Payer: BC Managed Care – PPO | Admitting: Oncology

## 2015-11-05 VITALS — BP 93/66 | HR 69 | Temp 98.2°F | Ht 63.0 in | Wt 139.3 lb

## 2015-11-05 DIAGNOSIS — Z79899 Other long term (current) drug therapy: Secondary | ICD-10-CM | POA: Diagnosis not present

## 2015-11-05 DIAGNOSIS — Z9221 Personal history of antineoplastic chemotherapy: Secondary | ICD-10-CM | POA: Diagnosis not present

## 2015-11-05 DIAGNOSIS — Z170 Estrogen receptor positive status [ER+]: Secondary | ICD-10-CM | POA: Diagnosis not present

## 2015-11-05 DIAGNOSIS — C50411 Malignant neoplasm of upper-outer quadrant of right female breast: Secondary | ICD-10-CM | POA: Insufficient documentation

## 2015-11-05 DIAGNOSIS — Z79810 Long term (current) use of selective estrogen receptor modulators (SERMs): Secondary | ICD-10-CM

## 2015-11-05 DIAGNOSIS — D649 Anemia, unspecified: Secondary | ICD-10-CM | POA: Diagnosis not present

## 2015-11-05 LAB — CBC WITH DIFFERENTIAL/PLATELET
Basophils Absolute: 0 10*3/uL (ref 0–0.1)
Basophils Relative: 0 %
Eosinophils Absolute: 0.2 10*3/uL (ref 0–0.7)
Eosinophils Relative: 4 %
HEMATOCRIT: 33.2 % — AB (ref 35.0–47.0)
Hemoglobin: 10.8 g/dL — ABNORMAL LOW (ref 12.0–16.0)
Lymphocytes Relative: 46 %
Lymphs Abs: 2.6 10*3/uL (ref 1.0–3.6)
MCH: 27.4 pg (ref 26.0–34.0)
MCHC: 32.5 g/dL (ref 32.0–36.0)
MCV: 84.4 fL (ref 80.0–100.0)
MONO ABS: 0.5 10*3/uL (ref 0.2–0.9)
MONOS PCT: 8 %
Neutro Abs: 2.4 10*3/uL (ref 1.4–6.5)
Neutrophils Relative %: 42 %
Platelets: 233 10*3/uL (ref 150–440)
RBC: 3.93 MIL/uL (ref 3.80–5.20)
RDW: 13.6 % (ref 11.5–14.5)
WBC: 5.8 10*3/uL (ref 3.6–11.0)

## 2015-11-05 LAB — COMPREHENSIVE METABOLIC PANEL
ALT: 10 U/L — ABNORMAL LOW (ref 14–54)
ANION GAP: 8 (ref 5–15)
AST: 22 U/L (ref 15–41)
Albumin: 4.1 g/dL (ref 3.5–5.0)
Alkaline Phosphatase: 55 U/L (ref 38–126)
BILIRUBIN TOTAL: 0.2 mg/dL — AB (ref 0.3–1.2)
BUN: 12 mg/dL (ref 6–20)
CO2: 26 mmol/L (ref 22–32)
Calcium: 9 mg/dL (ref 8.9–10.3)
Chloride: 103 mmol/L (ref 101–111)
Creatinine, Ser: 0.83 mg/dL (ref 0.44–1.00)
GFR calc Af Amer: >60 mL/min (ref >60)
GFR calc non Af Amer: >60 mL/min (ref >60)
Glucose, Bld: 88 mg/dL (ref 65–99)
POTASSIUM: 3.6 mmol/L (ref 3.5–5.1)
Sodium: 137 mmol/L (ref 135–145)
TOTAL PROTEIN: 7.3 g/dL (ref 6.5–8.1)

## 2015-11-05 LAB — IRON AND TIBC
IRON: 60 ug/dL (ref 28–170)
Saturation Ratios: 20 % (ref 10.4–31.8)
TIBC: 302 ug/dL (ref 250–450)
UIBC: 242 ug/dL

## 2015-11-05 LAB — FERRITIN: Ferritin: 82 ng/mL (ref 11–307)

## 2015-11-05 NOTE — Progress Notes (Signed)
Patients breast sore from reconstruction.

## 2015-11-06 ENCOUNTER — Encounter: Payer: Self-pay | Admitting: Oncology

## 2015-11-06 NOTE — Progress Notes (Signed)
[place] @ [facility] Telephone:(336) [phone]  Fax:(336) [place]: 03-Sep-1971  MR#: [id number]  TGP#:[id number]  Patient Care Team: [name], MD as PCP - General (Family Medicine) [name] Dear, MD (Family Medicine) Robert Bellow, MD (General Surgery) Forest Gleason, MD (Unknown Physician Specialty)  CHIEF COMPLAINT:  Chief Complaint  Patient presents with  . Breast Cancer    6 month follow up    Oncology History   1. Carcinoma breast (right breast) 2 cm palpable mass Invasive mammary carcinoma.  Estrogen receptor positive.  Progesterone receptor positive.  HER-2/neu receptor amplified. 2. Started on neoadjuvant chemotherapy,  Taxotere, carboplatin and Herceptin and perjeta (March 05, 2014) 3. Patient does not have any clinically significant BRCA mutation Has been on and to [place] Mutation of unknown significance. . 4. 4 cycles of chemotherapy with TCH,perjeta previous did not June of 201 5.  Because of progressing side effect remaining 2 cycles of chemotherapy was not given but patient continued Herceptin and aperjeta  5. Status post right breast simple mastectomy in August of 2015 ypT1  ypN0 stage IB 6.patient started on tamoxifen in October of 2015 along with maintenance Herceptin therapy Patient is going for reconstructive surgery on October 21, 2014 final reconstructive surgery was done in February of 2016 [name] finished Herceptin therapy on March 11, 2015     Breast cancer of upper-outer quadrant of right female breast ([place])   02/18/2014 Initial Diagnosis Breast cancer of upper-outer quadrant of right female breast    Oncology Flowsheet 04/14/2015  ondansetron (ZOFRAN) IV -    INTERVAL HISTORY:  44 year old lady with a history of carcinoma of breast status post mastectomy reconstruction.  Here for further follow-up.  Desires to get port removed.  Since last evaluation patient did not develop any significant nausea vomiting diarrhea no bony pains.   Taking tamoxifen and tolerating treatment very well.  Getting regular pelvic examination done. Patient had a problem with reconstructive surgery.  Left breast needs to be revised.  Here for further follow-up somewhat depressed because of multiple surgical intervention.  Taking tamoxifen.  Getting regular pelvic examination.  Patient is not having any menstrual cycle or vaginal bleeding no swelling of lower extremity REVIEW OF SYSTEMS:    GENERAL:  Feels good.  Active.  No fevers, sweats or weight loss. PERFORMANCE STATUS (ECOG):  0 HEENT:  No visual changes, runny nose, sore throat, mouth sores or tenderness. Lungs: No shortness of breath or cough.  No hemoptysis. Cardiac:  No chest pain, palpitations, orthopnea, or PND. GI:  No nausea, vomiting, diarrhea, constipation, melena or hematochezia. GU:  No urgency, frequency, dysuria, or hematuria. Musculoskeletal:  No back pain.  No joint pain.  No muscle tenderness. Extremities:  No pain or swelling. Skin:  No rashes or skin changes. Neuro:  No headache, numbness or weakness, balance or coordination issues. Endocrine:  No diabetes, thyroid issues, hot flashes or night sweats. Psych:  No mood changes, depression or anxiety. Pain:  No focal pain. Review of systems:  All other systems reviewed and found to be negative. As per HPI. Otherwise, a complete review of systems is negatve.  PAST MEDICAL HISTORY: Past Medical History  Diagnosis Date  . Breast cancer [facility]) March 2015     Clinical T1c, N0, BRCA negative  . Malignant neoplasm of upper-outer quadrant of female breast [facility]) August 2015    pT1b, N0; M0. 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 Anemia   . Migraines  PAST SURGICAL HISTORY: Past Surgical History  Procedure Laterality Date  . Leep  1990's  . Excisional hemorrhoidectomy  2015  . Breast biopsy Right 2015  . Breast surgery Right July 10, 2014    Right simple mastectomy with sentinel node biopsy.  .  Breast reconstruction Right 04/14/2015    Procedure: AEROLA/NIPPLE RECONSTRUCTION WITH GRAFT;  Surgeon: Nicholaus Bloom, MD;  Location: [place];  Service: Plastics;  Laterality: Right;    FAMILY HISTORY Family History  Problem Relation Age of Onset  . Cancer Maternal Aunt     breast cancer, age greater than 47 at diagnosis  . Cancer Maternal Aunt     cervical    ADVANCED DIRECTIVES:  Patient does not have any advanced healthcare directive. Information has been given.  HEALTH MAINTENANCE: Social History  Substance Use Topics  . Smoking status: Never Smoker   . Smokeless tobacco: Never Used  . Alcohol Use: No      Allergies  Allergen Reactions  . Keflex [Cephalexin] Rash    Current Outpatient Prescriptions  Medication Sig Dispense Refill  . BIOTIN 5000 PO Take 5,000 mg by mouth daily. 7 PM    . clonazePAM (KLONOPIN) 0.5 MG tablet Take 0.5 mg by mouth at bedtime as needed for anxiety.    . ferrous fumarate (HEMOCYTE - 106 MG FE) 325 (106 FE) MG TABS tablet Take 1 tablet (106 mg of iron total) by mouth daily. 30 each 3  . tamoxifen (NOLVADEX) 20 MG tablet Take 1 tablet (20 mg total) by mouth daily. 7 PM 90 tablet 3  . Vitamin D, Cholecalciferol, 1000 UNITS TABS Take by mouth. 7 PM     No current facility-administered medications for this visit.    OBJECTIVE:  Filed Vitals:   11/05/15 1419  BP: 93/66  Pulse: 69  Temp: 98.2 F (36.8 C)     Body mass index is 24.69 kg/(m^2).    ECOG FS:0 - Asymptomatic  PHYSICAL EXAM: General  status: Performance status is good.  Patient has not lost significant weight HEENT: No evidence of stomatitis. Sclera and conjunctivae :: No jaundice.   pale looking. Lungs: Air  entry equal on both sides.  No rhonchi.  No rales.  Cardiac: Heart sounds are normal.  No pericardial rub.  No murmur. Lymphatic system: Cervical, axillary, inguinal, lymph nodes not palpable GI: Abdomen is soft.  No ascites.  Liver spleen not palpable.  No  tenderness.  Bowel sounds are within normal limit Lower extremity: No edema Neurological system: Higher functions, cranial nerves intact no evidence of peripheral neuropathy. Skin: No rash.  No ecchymosis.. Examination of the right breast reconstruction no palpable lymphadenopathy Breast free of masses Patient also had reconstructive surgery in the left breast which has been revised twice so far.   LAB RESULTS:  Appointment on 11/05/2015  Component Date Value Ref Range Status  . WBC 11/05/2015 5.8  3.6 - 11.0 K/uL Final  . RBC 11/05/2015 3.93  3.80 - 5.20 MIL/uL Final  . Hemoglobin 11/05/2015 10.8* 12.0 - 16.0 g/dL Final  . HCT 11/05/2015 33.2* 35.0 - 47.0 % Final  . MCV 11/05/2015 84.4  80.0 - 100.0 fL Final  . MCH 11/05/2015 27.4  26.0 - 34.0 pg Final  . MCHC 11/05/2015 32.5  32.0 - 36.0 g/dL Final  . RDW 11/05/2015 13.6  11.5 - 14.5 % Final  . Platelets 11/05/2015 233  150 - 440 K/uL Final  . Neutrophils Relative % 11/05/2015 42   Final  .  Neutro Abs 11/05/2015 2.4  1.4 - 6.5 K/uL Final  . Lymphocytes Relative 11/05/2015 46   Final  . Lymphs Abs 11/05/2015 2.6  1.0 - 3.6 K/uL Final  . Monocytes Relative 11/05/2015 8   Final  . Monocytes Absolute 11/05/2015 0.5  0.2 - 0.9 K/uL Final  . Eosinophils Relative 11/05/2015 4   Final  . Eosinophils Absolute 11/05/2015 0.2  0 - 0.7 K/uL Final  . Basophils Relative 11/05/2015 0   Final  . Basophils Absolute 11/05/2015 0.0  0 - 0.1 K/uL Final  . Sodium 11/05/2015 137  135 - 145 mmol/L Final  . Potassium 11/05/2015 3.6  3.5 - 5.1 mmol/L Final  . Chloride 11/05/2015 103  101 - 111 mmol/L Final  . CO2 11/05/2015 26  22 - 32 mmol/L Final  . Glucose, Bld 11/05/2015 88  65 - 99 mg/dL Final  . BUN 11/05/2015 12  6 - 20 mg/dL Final  . Creatinine, Ser 11/05/2015 0.83  0.44 - 1.00 mg/dL Final  . Calcium 11/05/2015 9.0  8.9 - 10.3 mg/dL Final  . Total Protein 11/05/2015 7.3  6.5 - 8.1 g/dL Final  . Albumin 11/05/2015 4.1  3.5 - 5.0 g/dL Final    . AST 11/05/2015 22  15 - 41 U/L Final  . ALT 11/05/2015 10* 14 - 54 U/L Final  . Alkaline Phosphatase 11/05/2015 55  38 - 126 U/L Final  . Total Bilirubin 11/05/2015 0.2* 0.3 - 1.2 mg/dL Final  . GFR calc non Af Amer 11/05/2015 >60  >60 mL/min Final  . GFR calc Af Amer 11/05/2015 >60  >60 mL/min Final   Comment: (NOTE) The eGFR has been calculated using the CKD EPI equation. This calculation has not been validated in all clinical situations. eGFR's persistently <60 mL/min signify possible Chronic Kidney Disease.   . Anion gap 11/05/2015 8  5 - 15 Final  . Iron 11/05/2015 60  28 - 170 ug/dL Final  . TIBC 11/05/2015 302  250 - 450 ug/dL Final  . Saturation Ratios 11/05/2015 20  10.4 - 31.8 % Final  . UIBC 11/05/2015 242   Final  . Ferritin 11/05/2015 82  11 - 307 ng/mL Final    Lab Results  Component Value Date   LABCA2 31.7 10/08/2014    STUDIES: No results found.  ASSESSMENT: Carcinoma of breast status post chemotherapy and now on tamoxifen. Anemia We will get iron studies done during next appointment patient was advised to take iron tablets Will plan colonoscopy if it is not done. Continue tamoxifen Mammogram to be arranged by surgeon  MEDICAL DECISION MAKING:  Lab data has been reviewed there is no evidence of recurrent or progressive disease  Patient expressed understanding and was in agreement with this plan. She also understands that She can call clinic at any time with any questions, concerns, or complaints.    Breast cancer of upper-outer quadrant of right female breast   Staging form: Breast, AJCC 7th Edition     Clinical: Stage IIA (T2, N0, M0) - Signed by Forest Gleason, MD on 05/09/2015   Forest Gleason, MD   11/06/2015 7:55 AM

## 2015-11-18 ENCOUNTER — Other Ambulatory Visit: Payer: Self-pay | Admitting: *Deleted

## 2015-11-18 DIAGNOSIS — Z853 Personal history of malignant neoplasm of breast: Secondary | ICD-10-CM

## 2015-11-19 ENCOUNTER — Other Ambulatory Visit: Payer: Self-pay | Admitting: Plastic Surgery

## 2015-11-19 DIAGNOSIS — T148XXA Other injury of unspecified body region, initial encounter: Secondary | ICD-10-CM

## 2015-11-20 ENCOUNTER — Ambulatory Visit
Admission: RE | Admit: 2015-11-20 | Discharge: 2015-11-20 | Disposition: A | Discharge status: Home / Self Care | Payer: BC Managed Care – PPO | Source: Ambulatory Visit | Attending: Plastic Surgery | Admitting: Plastic Surgery

## 2015-11-20 DIAGNOSIS — T148XXA Other injury of unspecified body region, initial encounter: Secondary | ICD-10-CM

## 2016-02-04 ENCOUNTER — Ambulatory Visit
Admission: RE | Admit: 2016-02-04 | Discharge: 2016-02-04 | Disposition: A | Discharge status: Home / Self Care | Payer: BC Managed Care – PPO | Source: Ambulatory Visit | Attending: General Surgery | Admitting: General Surgery

## 2016-02-04 ENCOUNTER — Other Ambulatory Visit: Payer: Self-pay | Admitting: General Surgery

## 2016-02-04 DIAGNOSIS — Z853 Personal history of malignant neoplasm of breast: Secondary | ICD-10-CM

## 2016-02-04 DIAGNOSIS — Z1231 Encounter for screening mammogram for malignant neoplasm of breast: Secondary | ICD-10-CM | POA: Diagnosis not present

## 2016-02-15 ENCOUNTER — Ambulatory Visit: Payer: BC Managed Care – PPO | Admitting: General Surgery

## 2016-02-16 ENCOUNTER — Ambulatory Visit: Payer: BC Managed Care – PPO | Admitting: General Surgery

## 2016-02-25 ENCOUNTER — Encounter: Payer: Self-pay | Admitting: General Surgery

## 2016-02-25 ENCOUNTER — Ambulatory Visit (INDEPENDENT_AMBULATORY_CARE_PROVIDER_SITE_OTHER): Payer: BC Managed Care – PPO | Admitting: General Surgery

## 2016-02-25 VITALS — BP 122/74 | HR 72 | Resp 12 | Ht 63.0 in | Wt 150.0 lb

## 2016-02-25 DIAGNOSIS — C50411 Malignant neoplasm of upper-outer quadrant of right female breast: Secondary | ICD-10-CM

## 2016-02-25 DIAGNOSIS — Z1211 Encounter for screening for malignant neoplasm of colon: Secondary | ICD-10-CM

## 2016-02-25 MED ORDER — POLYETHYLENE GLYCOL 3350 17 GM/SCOOP PO POWD: ORAL | Status: DC

## 2016-02-25 NOTE — Progress Notes (Signed)
Patient ID: Sandra Nguyen, female   DOB: 1971/04/19, 45 y.o.   MRN: [id number]  Chief Complaint  Patient presents with  . Follow-up    mammogram    HPI Sandra Nguyen is a 45 y.o. female.  Here today for follow up mammogram done 02-04-16. She states the left breast has chronic tenderness to touch. Her last visit with Dr. Tula Nakayama was Monday. No new breast issues.   HPI  Past Medical History  Diagnosis Date  . Anemia   . Migraines   . Malignant neoplasm of upper-outer quadrant of female breast [facility]) August 2015    pT1b, N0; M0. 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 Breast cancer [facility]) March 2015     Clinical T1c, N0, BRCA negative    Past Surgical History  Procedure Laterality Date  . Leep  1990's  . Excisional hemorrhoidectomy  2015  . Mastectomy Right 07/2014    chemo and herceptin tx  . Breast surgery Right July 10, 2014    Right simple mastectomy with sentinel node biopsy.  . Breast reconstruction Right 04/14/2015    Procedure: AEROLA/NIPPLE RECONSTRUCTION WITH GRAFT;  Surgeon: Nicholaus Bloom, MD;  Location: [place];  Service: Plastics;  Laterality: Right;  . Augmentation mammaplasty Bilateral Nov 2016    Dr Tula Nakayama  . Breast biopsy Right 02/2014    positive    Family History  Problem Relation Age of Onset  . Cancer Maternal Aunt     breast cancer, age greater than 65 at diagnosis  . Cancer Maternal Aunt     cervical    Social History Social History  Substance Use Topics  . Smoking status: Never Smoker   . Smokeless tobacco: Never Used  . Alcohol Use: No    Allergies  Allergen Reactions  . Keflex [Cephalexin] Rash    Current Outpatient Prescriptions  Medication Sig Dispense Refill  . BIOTIN 5000 PO Take 5,000 mg by mouth daily. 7 PM    . clonazePAM (KLONOPIN) 0.5 MG tablet Take 0.5 mg by mouth at bedtime as needed for anxiety.    . ferrous fumarate (HEMOCYTE - 106 MG FE) 325 (106 FE) MG TABS tablet Take 1 tablet (106 mg of iron total)  by mouth daily. (Patient taking differently: Take 1 tablet by mouth as needed. ) 30 each 3  . tamoxifen (NOLVADEX) 20 MG tablet Take 1 tablet (20 mg total) by mouth daily. 7 PM 90 tablet 3  . Vitamin D, Cholecalciferol, 1000 UNITS TABS Take by mouth. 7 PM    . polyethylene glycol powder (GLYCOLAX/MIRALAX) powder 255 grams one bottle for colonoscopy prep 255 g 0   No current facility-administered medications for this visit.    Review of Systems Review of Systems  Constitutional: Negative.   Respiratory: Negative.   Cardiovascular: Negative.     Blood pressure 122/74, pulse 72, resp. rate 12, height '5\' 3"'  (1.6 m), weight 150 lb (68.04 kg).  Physical Exam Physical Exam  Constitutional: She is oriented to person, place, and time. She appears well-developed and well-nourished.  HENT:  Mouth/Throat: Oropharynx is clear and moist.  Eyes: Conjunctivae are normal. No scleral icterus.  Neck: Neck supple.  Cardiovascular: Normal rate, regular rhythm and normal heart sounds.   Pulmonary/Chest: Effort normal and breath sounds normal. Right breast exhibits no inverted nipple, no mass, no nipple discharge, no skin change and no tenderness. Left breast exhibits no inverted nipple, no mass, no nipple discharge, no skin change and no tenderness.    Stable  fold in implant at 7 o'clock at inframammary fold left breast.   Excellent symmetry.  Lymphadenopathy:    She has no cervical adenopathy.    She has no axillary adenopathy.  Neurological: She is alert and oriented to person, place, and time.  Skin: Skin is warm and dry.  Psychiatric: Her behavior is normal.    Data Reviewed Left breast mammogram dated 02/04/2016 was reviewed. BI-RADS-1.  Assessment    Benign breast exam. Higher risk for colon cancer based on past history of breast cancer, candidate for screening exam.    Plan        The patient has been asked to return to the office in one year with a unilateral left breast  screening mammogram.  Colonoscopy with possible biopsy/polypectomy prn: Information regarding the procedure, including its potential risks and complications (including but not limited to perforation of the bowel, which may require emergency surgery to repair, and bleeding) was verbally given to the patient. Educational information regarding lower intestinal endoscopy was given to the patient. Written instructions for how to complete the bowel prep using Miralax were provided. The importance of drinking ample fluids to avoid dehydration as a result of the prep emphasized.  Patient has been scheduled for a colonoscopy on 05-04-16 at [facility].  PCP:  [name] Dr. Oliva Bustard  This information has been scribed by Karie Fetch RNBC.   Robert Bellow 02/26/2016, 10:08 AM

## 2016-02-25 NOTE — Patient Instructions (Addendum)
The patient is aware to call back for any questions or concerns. The patient has been asked to return to the office in one year with a unilateral left breast screening mammogram.Colonoscopy A colonoscopy is an exam to look at the entire large intestine (colon). This exam can help find problems such as tumors, polyps, inflammation, and areas of bleeding. The exam takes about 1 hour.  LET [facility] CARE PROVIDER KNOW ABOUT:   Any allergies you have.  All medicines you are taking, including vitamins, herbs, eye drops, creams, and over-the-counter medicines.  Previous problems you or members of your family have had with the use of anesthetics.  Any blood disorders you have.  Previous surgeries you have had.  Medical conditions you have. RISKS AND COMPLICATIONS  Generally, this is a safe procedure. However, as with any procedure, complications can occur. Possible complications include:  Bleeding.  Tearing or rupture of the colon wall.  Reaction to medicines given during the exam.  Infection (rare). BEFORE THE PROCEDURE   Ask your health care provider about changing or stopping your regular medicines.  You may be prescribed an oral bowel prep. This involves drinking a large amount of medicated liquid, starting the day before your procedure. The liquid will cause you to have multiple loose stools until your stool is almost clear or light green. This cleans out your colon in preparation for the procedure.  Do not eat or drink anything else once you have started the bowel prep, unless your health care provider tells you it is safe to do so.  Arrange for someone to drive you home after the procedure. PROCEDURE   You will be given medicine to help you relax (sedative).  You will lie on your side with your knees bent.  A long, flexible tube with a light and camera on the end (colonoscope) will be inserted through the rectum and into the colon. The camera sends video back to a computer  screen as it moves through the colon. The colonoscope also releases carbon dioxide gas to inflate the colon. This helps your health care provider see the area better.  During the exam, your health care provider may take a small tissue sample (biopsy) to be examined under a microscope if any abnormalities are found.  The exam is finished when the entire colon has been viewed. AFTER THE PROCEDURE   Do not drive for 24 hours after the exam.  You may have a small amount of blood in your stool.  You may pass moderate amounts of gas and have mild abdominal cramping or bloating. This is caused by the gas used to inflate your colon during the exam.  Ask when your test results will be ready and how you will get your results. Make sure you get your test results.   This information is not intended to replace advice given to you by your health care provider. Make sure you discuss any questions you have with your health care provider.   Document Released: 11/18/2000 Document Revised: 09/11/2013 Document Reviewed: 07/29/2013 Elsevier Interactive Patient Education [employer].   Patient has been scheduled for a colonoscopy on 05-04-16 at [facility].

## 2016-02-26 ENCOUNTER — Other Ambulatory Visit: Payer: Self-pay | Admitting: General Surgery

## 2016-02-26 DIAGNOSIS — Z1211 Encounter for screening for malignant neoplasm of colon: Secondary | ICD-10-CM

## 2016-02-26 NOTE — H&P (Signed)
HPI Sandra Nguyen is a 45 y.o. female. Here today for follow up mammogram done 02-04-16. She states the left breast has chronic tenderness to touch. Her last visit with Dr. Tula Nakayama was Monday. No new breast issues.   HPI  Past Medical History  Diagnosis Date  . Anemia   . Migraines   . Malignant neoplasm of upper-outer quadrant of female breast [facility]) August 2015    pT1b, N0; M0. 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 Breast cancer [facility]) March 2015     Clinical T1c, N0, BRCA negative    Past Surgical History  Procedure Laterality Date  . Leep  1990's  . Excisional hemorrhoidectomy  2015  . Mastectomy Right 07/2014    chemo and herceptin tx  . Breast surgery Right July 10, 2014    Right simple mastectomy with sentinel node biopsy.  . Breast reconstruction Right 04/14/2015    Procedure: AEROLA/NIPPLE RECONSTRUCTION WITH GRAFT; Surgeon: Nicholaus Bloom, MD; Location: [place]; Service: Plastics; Laterality: Right;  . Augmentation mammaplasty Bilateral Nov 2016    Dr Tula Nakayama  . Breast biopsy Right 02/2014    positive    Family History  Problem Relation Age of Onset  . Cancer Maternal Aunt     breast cancer, age greater than 72 at diagnosis  . Cancer Maternal Aunt     cervical    Social History Social History  Substance Use Topics  . Smoking status: Never Smoker   . Smokeless tobacco: Never Used  . Alcohol Use: No    Allergies  Allergen Reactions  . Keflex [Cephalexin] Rash    Current Outpatient Prescriptions  Medication Sig Dispense Refill  . BIOTIN 5000 PO Take 5,000 mg by mouth daily. 7 PM    . clonazePAM (KLONOPIN) 0.5 MG tablet Take 0.5 mg by mouth at bedtime as needed for anxiety.    . ferrous fumarate (HEMOCYTE - 106 MG FE) 325 (106 FE) MG TABS tablet Take 1 tablet (106 mg of iron total) by mouth daily. (Patient  taking differently: Take 1 tablet by mouth as needed. ) 30 each 3  . tamoxifen (NOLVADEX) 20 MG tablet Take 1 tablet (20 mg total) by mouth daily. 7 PM 90 tablet 3  . Vitamin D, Cholecalciferol, 1000 UNITS TABS Take by mouth. 7 PM    . polyethylene glycol powder (GLYCOLAX/MIRALAX) powder 255 grams one bottle for colonoscopy prep 255 g 0   No current facility-administered medications for this visit.    Review of Systems Review of Systems  Constitutional: Negative.  Respiratory: Negative.  Cardiovascular: Negative.    Blood pressure 122/74, pulse 72, resp. rate 12, height _0  (1.6 m), weight 150 lb (68.04 kg).  Physical Exam Physical Exam  Constitutional: She is oriented to person, place, and time. She appears well-developed and well-nourished.  HENT:  Mouth/Throat: Oropharynx is clear and moist.  Eyes: Conjunctivae are normal. No scleral icterus.  Neck: Neck supple.  Cardiovascular: Normal rate, regular rhythm and normal heart sounds.  Pulmonary/Chest: Effort normal and breath sounds normal. Right breast exhibits no inverted nipple, no mass, no nipple discharge, no skin change and no tenderness. Left breast exhibits no inverted nipple, no mass, no nipple discharge, no skin change and no tenderness.    Stable fold in implant at 7 o'clock at inframammary fold left breast.   Excellent symmetry.  Lymphadenopathy:   She has no cervical adenopathy.   She has no axillary adenopathy.  Neurological: She is alert and oriented to person, place, and  time.  Skin: Skin is warm and dry.  Psychiatric: Her behavior is normal.    Data Reviewed Left breast mammogram dated 02/04/2016 was reviewed. BI-RADS-1.  Assessment    Benign breast exam. Higher risk for colon cancer based on past history of breast cancer, candidate for screening exam.    Plan        The patient has been asked to return to the office in one year with a unilateral left breast screening  mammogram.  Colonoscopy with possible biopsy/polypectomy prn: Information regarding the procedure, including its potential risks and complications (including but not limited to perforation of the bowel, which may require emergency surgery to repair, and bleeding) was verbally given to the patient. Educational information regarding lower intestinal endoscopy was given to the patient. Written instructions for how to complete the bowel prep using Miralax were provided. The importance of drinking ample fluids to avoid dehydration as a result of the prep emphasized.  Patient has been scheduled for a colonoscopy on 05-04-16 at [facility].  PCP: [name] Dr. Oliva Bustard  This information has been scribed by Karie Fetch RNBC.   Robert Bellow 02/26/2016, 10:08 AM

## 2016-03-21 ENCOUNTER — Ambulatory Visit: Payer: BC Managed Care – PPO | Admitting: Oncology

## 2016-03-21 ENCOUNTER — Other Ambulatory Visit: Payer: BC Managed Care – PPO

## 2016-03-24 ENCOUNTER — Encounter: Payer: Self-pay | Admitting: General Surgery

## 2016-03-24 NOTE — Progress Notes (Signed)
I ran into the patient at the grocery store, and noticed that she had a little bit of a limp. She reports a one-month history of discomfort in the right knee which she's been making use of ice intermittently to help. She's made no use of OTC medications.  She attributed her discomfort to her tamoxifen.  The knee was obviously swollen and slightly warm. No popliteal tenderness, and she denies any calf tenderness.  The patient has been asked make use of Aleve, 2 tablets twice a day, elevation at rest and ice intermittently. She is scheduled to follow-up next week with Dr. Oliva Bustard and he'll reassess the area that time and see her response to conservative measures.

## 2016-03-30 ENCOUNTER — Inpatient Hospital Stay: Payer: BC Managed Care – PPO | Attending: Oncology

## 2016-03-30 ENCOUNTER — Encounter: Payer: Self-pay | Admitting: Oncology

## 2016-03-30 ENCOUNTER — Inpatient Hospital Stay (HOSPITAL_BASED_OUTPATIENT_CLINIC_OR_DEPARTMENT_OTHER): Payer: BC Managed Care – PPO | Admitting: Oncology

## 2016-03-30 VITALS — BP 121/84 | HR 66 | Temp 97.2°F | Resp 18 | Wt 152.6 lb

## 2016-03-30 DIAGNOSIS — C50411 Malignant neoplasm of upper-outer quadrant of right female breast: Secondary | ICD-10-CM | POA: Diagnosis not present

## 2016-03-30 DIAGNOSIS — Z79810 Long term (current) use of selective estrogen receptor modulators (SERMs): Secondary | ICD-10-CM

## 2016-03-30 DIAGNOSIS — Z170 Estrogen receptor positive status [ER+]: Secondary | ICD-10-CM | POA: Diagnosis not present

## 2016-03-30 DIAGNOSIS — Z9011 Acquired absence of right breast and nipple: Secondary | ICD-10-CM | POA: Diagnosis not present

## 2016-03-30 DIAGNOSIS — M25461 Effusion, right knee: Secondary | ICD-10-CM

## 2016-03-30 DIAGNOSIS — Z9221 Personal history of antineoplastic chemotherapy: Secondary | ICD-10-CM

## 2016-03-30 DIAGNOSIS — M25561 Pain in right knee: Secondary | ICD-10-CM

## 2016-03-30 LAB — CBC WITH DIFFERENTIAL/PLATELET
BASOS ABS: 0 10*3/uL (ref 0–0.1)
Basophils Relative: 1 %
EOS ABS: 0.2 10*3/uL (ref 0–0.7)
Eosinophils Relative: 4 %
HEMATOCRIT: 33.4 % — AB (ref 35.0–47.0)
Hemoglobin: 11.2 g/dL — ABNORMAL LOW (ref 12.0–16.0)
Lymphocytes Relative: 44 %
Lymphs Abs: 2.7 10*3/uL (ref 1.0–3.6)
MCH: 28.1 pg (ref 26.0–34.0)
MCHC: 33.5 g/dL (ref 32.0–36.0)
MCV: 84 fL (ref 80.0–100.0)
MONO ABS: 0.6 10*3/uL (ref 0.2–0.9)
MONOS PCT: 10 %
NEUTROS PCT: 41 %
Neutro Abs: 2.5 10*3/uL (ref 1.4–6.5)
Platelets: 208 10*3/uL (ref 150–440)
RBC: 3.98 MIL/uL (ref 3.80–5.20)
RDW: 14.2 % (ref 11.5–14.5)
WBC: 6.1 10*3/uL (ref 3.6–11.0)

## 2016-03-30 LAB — COMPREHENSIVE METABOLIC PANEL
ALT: 15 U/L (ref 14–54)
AST: 29 U/L (ref 15–41)
Albumin: 3.9 g/dL (ref 3.5–5.0)
Alkaline Phosphatase: 57 U/L (ref 38–126)
Anion gap: 7 (ref 5–15)
BILIRUBIN TOTAL: 0.2 mg/dL — AB (ref 0.3–1.2)
BUN: 11 mg/dL (ref 6–20)
CALCIUM: 9 mg/dL (ref 8.9–10.3)
CO2: 26 mmol/L (ref 22–32)
CREATININE: 0.86 mg/dL (ref 0.44–1.00)
Chloride: 107 mmol/L (ref 101–111)
GFR calc Af Amer: >60 mL/min (ref >60)
Glucose, Bld: 79 mg/dL (ref 65–99)
POTASSIUM: 3.8 mmol/L (ref 3.5–5.1)
Sodium: 140 mmol/L (ref 135–145)
TOTAL PROTEIN: 6.8 g/dL (ref 6.5–8.1)

## 2016-03-30 NOTE — Progress Notes (Signed)
[place] @ [facility] [phone]  Fax:([phone]) [place]: 04-09-1971  MR#: [id number]  YIA#:[id number]  Patient Care Team: [name], MD as PCP - General (Family Medicine) [name] Dear, MD (Family Medicine) Robert Bellow, MD (General Surgery) Forest Gleason, MD (Unknown Physician Specialty)  CHIEF COMPLAINT:  Chief Complaint  Patient presents with  . Breast Cancer    Oncology History   1. Carcinoma breast (right breast) 2 cm palpable mass Invasive mammary carcinoma.  Estrogen receptor positive.  Progesterone receptor positive.  HER-2/neu receptor amplified. 2. Started on neoadjuvant chemotherapy,  Taxotere, carboplatin and Herceptin and perjeta (March 05, 2014) 3. Patient does not have any clinically significant BRCA mutation Has been on and to [place] Mutation of unknown significance. . 4. 4 cycles of chemotherapy with TCH,perjeta previous did not June of 201 5.  Because of progressing side effect remaining 2 cycles of chemotherapy was not given but patient continued Herceptin and aperjeta  5. Status post right breast simple mastectomy in August of 2015 ypT1  ypN0 stage IB 6.patient started on tamoxifen in October of 2015 along with maintenance Herceptin therapy Patient is going for reconstructive surgery on October 21, 2014 final reconstructive surgery was done in February of 2016 [name] finished Herceptin therapy on March 11, 2015     Breast cancer of upper-outer quadrant of right female breast ([place])   02/18/2014 Initial Diagnosis Breast cancer of upper-outer quadrant of right female breast    Oncology Flowsheet 04/14/2015  ondansetron (ZOFRAN) IV -    INTERVAL HISTORY:  45 year old lady with a history of carcinoma of breast status post mastectomy reconstruction.  Here for further follow-up.  Desires to get port removed.  I received Dr. Dwyane Luo note .  Patient started having increasing pain and discomfort in the right knee.  Also noticed  swelling off and on.  She does not remember injuring her right knee .  No other bone pain.  Occasional hot flushes.  Getting regular mammograms done. Here  for further follow-up and treatment consideration REVIEW OF SYSTEMS:    GENERAL:  Feels good.  Active.  No fevers, sweats or weight loss. PERFORMANCE STATUS (ECOG):  0 HEENT:  No visual changes, runny nose, sore throat, mouth sores or tenderness. Lungs: No shortness of breath or cough.  No hemoptysis. Cardiac:  No chest pain, palpitations, orthopnea, or PND. GI:  No nausea, vomiting, diarrhea, constipation, melena or hematochezia. GU:  No urgency, frequency, dysuria, or hematuria. Musculoskeletal: Pain and swelling of the right knee or below for last few months.  Does not remember injuring her right knee. Extremities:  No pain or swelling. Skin:  No rashes or skin changes. Neuro:  No headache, numbness or weakness, balance or coordination issues. Endocrine:  No diabetes, thyroid issues, hot flashes or night sweats. Psych:  No mood changes, depression or anxiety. Pain:  No focal pain. Review of systems:  All other systems reviewed and found to be negative. As per HPI. Otherwise, a complete review of systems is negatve.  PAST MEDICAL HISTORY: Past Medical History  Diagnosis Date  . Anemia   . Migraines   . Malignant neoplasm of upper-outer quadrant of female breast [facility]) August 2015    pT1b, N0; M0. 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 Breast cancer [facility]) March 2015     Clinical T1c, N0, BRCA negative    PAST SURGICAL HISTORY: Past Surgical History  Procedure Laterality Date  . Leep  1990's  . Excisional hemorrhoidectomy  2015  . Mastectomy Right 07/2014    chemo and herceptin tx  . Breast surgery Right July 10, 2014    Right simple mastectomy with sentinel node biopsy.  . Breast reconstruction Right 04/14/2015    Procedure: AEROLA/NIPPLE RECONSTRUCTION WITH GRAFT;  Surgeon: Nicholaus Bloom, MD;  Location: [place];  Service: Plastics;  Laterality: Right;  . Augmentation mammaplasty Bilateral Nov 2016    Dr Tula Nakayama  . Breast biopsy Right 02/2014    positive    FAMILY HISTORY Family History  Problem Relation Age of Onset  . Cancer Maternal Aunt     breast cancer, age greater than 64 at diagnosis  . Cancer Maternal Aunt     cervical    ADVANCED DIRECTIVES:  Patient does not have any advanced healthcare directive. Information has been given.  HEALTH MAINTENANCE: Social History  Substance Use Topics  . Smoking status: Never Smoker   . Smokeless tobacco: Never Used  . Alcohol Use: No      Allergies  Allergen Reactions  . Keflex [Cephalexin] Rash    Current Outpatient Prescriptions  Medication Sig Dispense Refill  . BIOTIN 5000 PO Take 5,000 mg by mouth daily. 7 PM    . clonazePAM (KLONOPIN) 0.5 MG tablet Take 0.5 mg by mouth at bedtime as needed for anxiety.    . ferrous fumarate (HEMOCYTE - 106 MG FE) 325 (106 FE) MG TABS tablet Take 1 tablet (106 mg of iron total) by mouth daily. (Patient taking differently: Take 1 tablet by mouth as needed. ) 30 each 3  . polyethylene glycol powder (GLYCOLAX/MIRALAX) powder 255 grams one bottle for colonoscopy prep 255 g 0  . tamoxifen (NOLVADEX) 20 MG tablet Take 1 tablet (20 mg total) by mouth daily. 7 PM 90 tablet 3  . Vitamin D, Cholecalciferol, 1000 UNITS TABS Take by mouth. 7 PM     No current facility-administered medications for this visit.    OBJECTIVE:  Filed Vitals:   03/30/16 1404  BP: 121/84  Pulse: 66  Temp: 97.2 F (36.2 C)  Resp: 18     Body mass index is 27.03 kg/(m^2).    ECOG FS:0 - Asymptomatic  PHYSICAL EXAM: General  status: Performance status is good.  Patient has not lost significant weight HEENT: No evidence of stomatitis. Sclera and conjunctivae :: No jaundice.   pale looking. Lungs: Air  entry equal on both sides.  No rhonchi.  No rales.  Cardiac: Heart sounds are normal.  No pericardial rub.  No  murmur. Lymphatic system: Cervical, axillary, inguinal, lymph nodes not palpable GI: Abdomen is soft.  No ascites.  Liver spleen not palpable.  No tenderness.  Bowel sounds are within normal limit Lower extremity: No edema Examination shows a right knee swelling and tenderness Neurological system: Higher functions, cranial nerves intact no evidence of peripheral neuropathy. Skin: No rash.  No ecchymosis.. Examination of the right breast reconstruction no palpable lymphadenopathy Breast free of masses Patient also had reconstructive surgery in the left breast which has been revised twice so far.   LAB RESULTS:  Appointment on 03/30/2016  Component Date Value Ref Range Status  . WBC 03/30/2016 6.1  3.6 - 11.0 K/uL Final  . RBC 03/30/2016 3.98  3.80 - 5.20 MIL/uL Final  . Hemoglobin 03/30/2016 11.2* 12.0 - 16.0 g/dL Final  . HCT 03/30/2016 33.4* 35.0 - 47.0 % Final  . MCV 03/30/2016 84.0  80.0 - 100.0 fL Final  . MCH 03/30/2016 28.1  26.0 -  34.0 pg Final  . MCHC 03/30/2016 33.5  32.0 - 36.0 g/dL Final  . RDW 03/30/2016 14.2  11.5 - 14.5 % Final  . Platelets 03/30/2016 208  150 - 440 K/uL Final  . Neutrophils Relative % 03/30/2016 41   Final  . Neutro Abs 03/30/2016 2.5  1.4 - 6.5 K/uL Final  . Lymphocytes Relative 03/30/2016 44   Final  . Lymphs Abs 03/30/2016 2.7  1.0 - 3.6 K/uL Final  . Monocytes Relative 03/30/2016 10   Final  . Monocytes Absolute 03/30/2016 0.6  0.2 - 0.9 K/uL Final  . Eosinophils Relative 03/30/2016 4   Final  . Eosinophils Absolute 03/30/2016 0.2  0 - 0.7 K/uL Final  . Basophils Relative 03/30/2016 1   Final  . Basophils Absolute 03/30/2016 0.0  0 - 0.1 K/uL Final  . Sodium 03/30/2016 140  135 - 145 mmol/L Final  . Potassium 03/30/2016 3.8  3.5 - 5.1 mmol/L Final  . Chloride 03/30/2016 107  101 - 111 mmol/L Final  . CO2 03/30/2016 26  22 - 32 mmol/L Final  . Glucose, Bld 03/30/2016 79  65 - 99 mg/dL Final  . BUN 03/30/2016 11  6 - 20 mg/dL Final  .  Creatinine, Ser 03/30/2016 0.86  0.44 - 1.00 mg/dL Final  . Calcium 03/30/2016 9.0  8.9 - 10.3 mg/dL Final  . Total Protein 03/30/2016 6.8  6.5 - 8.1 g/dL Final  . Albumin 03/30/2016 3.9  3.5 - 5.0 g/dL Final  . AST 03/30/2016 29  15 - 41 U/L Final  . ALT 03/30/2016 15  14 - 54 U/L Final  . Alkaline Phosphatase 03/30/2016 57  38 - 126 U/L Final  . Total Bilirubin 03/30/2016 0.2* 0.3 - 1.2 mg/dL Final  . GFR calc non Af Amer 03/30/2016 >60  >60 mL/min Final  . GFR calc Af Amer 03/30/2016 >60  >60 mL/min Final   Comment: (NOTE) The eGFR has been calculated using the CKD EPI equation. This calculation has not been validated in all clinical situations. eGFR's persistently <60 mL/min signify possible Chronic Kidney Disease.   . Anion gap 03/30/2016 7  5 - 15 Final    Lab Results  Component Value Date   LABCA2 31.7 10/08/2014    STUDIES: No results found.  ASSESSMENT: Carcinoma of breast status post chemotherapy and now on tamoxifen. Anemia: Improving patient is getting colonoscopy done in May, 2017 Clinical examination shows no evidence of recurrent carcinoma of breast Patient is getting pelvic checkup done on regular basis  Right knee swelling: Traumatic versus arthritis.  Patient has collection of fluid.  Patient would need removal of fluid from the right knee and may need MRI scan to further define etiology.  Patient was referred to orthopedic surgeon Not related to tamoxifen or malignancy  MEDICAL DECISION MAKING:  Mammogram was done in March of 2017. Appointment has been scheduled for orthopedic surgeon to evaluate right knee Lab data has been reviewed there is no evidence of recurrent or progressive disease  Patient expressed understanding and was in agreement with this plan. She also understands that She can call clinic at any time with any questions, concerns, or complaints.    Breast cancer of upper-outer quadrant of right female breast   Staging form: Breast, AJCC  7th Edition     Clinical: Stage IIA (T2, N0, M0) - Signed by Forest Gleason, MD on 05/09/2015   Forest Gleason, MD   03/30/2016 2:33 PM

## 2016-03-30 NOTE — Progress Notes (Signed)
Patient state her right knee has been swelling and hurts at times.  Spoke with Dr. Bary Castilla who advised her to take Aleve.  Patient states she has also experienced pain in her left breast.

## 2016-03-31 ENCOUNTER — Other Ambulatory Visit: Payer: Self-pay | Admitting: Oncology

## 2016-04-02 ENCOUNTER — Encounter: Payer: Self-pay | Admitting: Oncology

## 2016-04-07 DIAGNOSIS — M2240 Chondromalacia patellae, unspecified knee: Secondary | ICD-10-CM | POA: Insufficient documentation

## 2016-04-13 ENCOUNTER — Telehealth: Payer: Self-pay | Admitting: *Deleted

## 2016-04-13 MED ORDER — TAMOXIFEN CITRATE 20 MG PO TABS: 20.0000 mg | ORAL_TABLET | Freq: Every day | ORAL | Status: DC

## 2016-04-13 NOTE — Telephone Encounter (Signed)
Refill sent to pharmacy.   

## 2016-04-27 ENCOUNTER — Telehealth: Payer: Self-pay | Admitting: *Deleted

## 2016-04-27 NOTE — Telephone Encounter (Signed)
Patient confirms that she is taking the same medications since her last office visit with the exception of iron. Medication list was updated accordingly.   Also, patient states she has Miralax prescription.   We will proceed with colonoscopy as scheduled for 05-04-16 at [facility].   This patient was instructed to call the office should she have further questions.

## 2016-05-04 ENCOUNTER — Ambulatory Visit: Payer: BC Managed Care – PPO | Admitting: Anesthesiology

## 2016-05-04 ENCOUNTER — Encounter: Payer: Self-pay | Admitting: *Deleted

## 2016-05-04 ENCOUNTER — Encounter
Admission: RE | Disposition: A | Discharge status: Home / Self Care | Payer: Self-pay | Source: Ambulatory Visit | Attending: General Surgery

## 2016-05-04 ENCOUNTER — Ambulatory Visit
Admission: RE | Admit: 2016-05-04 | Discharge: 2016-05-04 | Disposition: A | Discharge status: Home / Self Care | Payer: BC Managed Care – PPO | Source: Ambulatory Visit | Attending: General Surgery | Admitting: General Surgery

## 2016-05-04 DIAGNOSIS — Z1211 Encounter for screening for malignant neoplasm of colon: Secondary | ICD-10-CM | POA: Diagnosis not present

## 2016-05-04 DIAGNOSIS — F419 Anxiety disorder, unspecified: Secondary | ICD-10-CM | POA: Diagnosis not present

## 2016-05-04 DIAGNOSIS — Z862 Personal history of diseases of the blood and blood-forming organs and certain disorders involving the immune mechanism: Secondary | ICD-10-CM | POA: Diagnosis not present

## 2016-05-04 DIAGNOSIS — Z853 Personal history of malignant neoplasm of breast: Secondary | ICD-10-CM | POA: Diagnosis not present

## 2016-05-04 DIAGNOSIS — Z79899 Other long term (current) drug therapy: Secondary | ICD-10-CM | POA: Insufficient documentation

## 2016-05-04 HISTORY — PX: COLONOSCOPY WITH PROPOFOL: SHX5780

## 2016-05-04 SURGERY — COLONOSCOPY WITH PROPOFOL
Anesthesia: General

## 2016-05-04 MED ORDER — PHENYLEPHRINE HCL 10 MG/ML IJ SOLN
INTRAMUSCULAR | Status: DC | PRN
  Administered 2016-05-04: 100 ug via INTRAVENOUS

## 2016-05-04 MED ORDER — LIDOCAINE HCL (CARDIAC) 20 MG/ML IV SOLN
INTRAVENOUS | Status: DC | PRN
  Administered 2016-05-04: 40 mg via INTRAVENOUS

## 2016-05-04 MED ORDER — FENTANYL CITRATE (PF) 100 MCG/2ML IJ SOLN
INTRAMUSCULAR | Status: DC | PRN
  Administered 2016-05-04: 50 ug via INTRAVENOUS

## 2016-05-04 MED ORDER — SODIUM CHLORIDE 0.9 % IV SOLN
INTRAVENOUS | Status: DC
  Administered 2016-05-04: 1000 mL via INTRAVENOUS

## 2016-05-04 MED ORDER — MIDAZOLAM HCL 2 MG/2ML IJ SOLN
INTRAMUSCULAR | Status: DC | PRN
  Administered 2016-05-04: 1 mg via INTRAVENOUS

## 2016-05-04 MED ORDER — PROPOFOL 500 MG/50ML IV EMUL
INTRAVENOUS | Status: DC | PRN
  Administered 2016-05-04: 180 ug/kg/min via INTRAVENOUS

## 2016-05-04 MED ORDER — PROPOFOL 10 MG/ML IV BOLUS
INTRAVENOUS | Status: DC | PRN
  Administered 2016-05-04: 60 mg via INTRAVENOUS

## 2016-05-04 NOTE — Anesthesia Preprocedure Evaluation (Signed)
Anesthesia Evaluation  Patient identified by MRN, date of birth, ID band Patient awake    Reviewed: Allergy & Precautions, H&P , NPO status , Patient's Chart, lab work & pertinent test results  History of Anesthesia Complications Negative for: history of anesthetic complications  Airway Mallampati: III  TM Distance: <3 FB Neck ROM: full    Dental  (+) Poor Dentition   Pulmonary neg pulmonary ROS, neg shortness of breath,    Pulmonary exam normal breath sounds clear to auscultation       Cardiovascular Exercise Tolerance: Good (-) angina(-) Past MI and (-) DOE negative cardio ROS Normal cardiovascular exam Rhythm:regular Rate:Normal     Neuro/Psych  Headaches, negative psych ROS   GI/Hepatic negative GI ROS, Neg liver ROS, neg GERD  ,  Endo/Other  negative endocrine ROS  Renal/GU negative Renal ROS  negative genitourinary   Musculoskeletal   Abdominal   Peds  Hematology negative hematology ROS (+)   Anesthesia Other Findings Past Medical History:   Anemia                                                       Migraines                                                    Malignant neoplasm of upper-outer quadrant of * August 20*     Comment:pT1b, N0; M0. ER-positive,               PR-positive,HER-2/neu overexpression   Breast cancer ([place])                             March 201*     Comment:Clinical T1c, N0, BRCA negative  Past Surgical History:   LEEP                                             1990's       EXCISIONAL HEMORRHOIDECTOMY                      2015         MASTECTOMY                                      Right 07/2014         Comment:chemo and herceptin tx   BREAST SURGERY                                  Right August 6,*     Comment:Right simple mastectomy with sentinel node               biopsy.   BREAST RECONSTRUCTION                           Right 04/14/2015  Comment:Procedure: AEROLA/NIPPLE  RECONSTRUCTION WITH               GRAFT;  Surgeon: Nicholaus Bloom, MD;  Location:               [place];  Service: Plastics;                Laterality: Right;   AUGMENTATION MAMMAPLASTY                        Bilateral Nov 2016       Comment:Dr Coan   BREAST BIOPSY                                   Right 02/2014         Comment:positive  BMI    Body Mass Index   27.70 kg/m 2      Reproductive/Obstetrics negative OB ROS                             Anesthesia Physical Anesthesia Plan  ASA: III  Anesthesia Plan: General   Post-op Pain Management:    Induction:   Airway Management Planned:   Additional Equipment:   Intra-op Plan:   Post-operative Plan:   Informed Consent: I have reviewed the patients History and Physical, chart, labs and discussed the procedure including the risks, benefits and alternatives for the proposed anesthesia with the patient or authorized representative who has indicated his/her understanding and acceptance.   Dental Advisory Given  Plan Discussed with: Anesthesiologist, CRNA and Surgeon  Anesthesia Plan Comments:         Anesthesia Quick Evaluation

## 2016-05-04 NOTE — Transfer of Care (Signed)
Immediate Anesthesia Transfer of Care Note  Patient: Sandra Nguyen  Procedure(s) Performed: Procedure(s): COLONOSCOPY WITH PROPOFOL (N/A)  Patient Location: PACU and Endoscopy Unit  Anesthesia Type:General  Level of Consciousness: sedated  Airway & Oxygen Therapy: Patient Spontanous Breathing and Patient connected to nasal cannula oxygen  Post-op Assessment: Report given to RN and Post -op Vital signs reviewed and stable  Post vital signs: Reviewed and stable  Last Vitals:  Filed Vitals:   05/04/16 0724 05/04/16 0830  BP: 119/74 99/62  Pulse: 77 85  Temp: 36.5 C 36.3 C  Resp: 16 18    Complications: No apparent anesthesia complications

## 2016-05-04 NOTE — Anesthesia Procedure Notes (Signed)
Date/Time: 05/04/2016 8:00 AM Performed by: Doreen Salvage Pre-anesthesia Checklist: Patient identified, Emergency Drugs available, Suction available and Patient being monitored Patient Re-evaluated:Patient Re-evaluated prior to inductionOxygen Delivery Method: Nasal cannula Intubation Type: IV induction Dental Injury: Teeth and Oropharynx as per pre-operative assessment  Comments: Nasal cannula with etCO2 monitoring

## 2016-05-04 NOTE — H&P (Signed)
WANIA LONGSTRETH [id number] 08-12-1971     HPI: 45 y.o woman for colonoscopy for planned colonoscopy. Increased risk based on past history of breast cancer.   Prescriptions prior to admission  Medication Sig Dispense Refill Last Dose  . BIOTIN 5000 PO Take 5,000 mg by mouth daily. 7 PM   Taking  . clonazePAM (KLONOPIN) 0.5 MG tablet Take 0.5 mg by mouth at bedtime as needed for anxiety.   Taking  . polyethylene glycol powder (GLYCOLAX/MIRALAX) powder 255 grams one bottle for colonoscopy prep 255 g 0 Taking  . tamoxifen (NOLVADEX) 20 MG tablet Take 1 tablet (20 mg total) by mouth daily. 30 tablet 6   . Vitamin D, Cholecalciferol, 1000 UNITS TABS Take by mouth. 7 PM   Taking   Allergies  Allergen Reactions  . Keflex [Cephalexin] Rash   Past Medical History  Diagnosis Date  . Anemia   . Migraines   . Malignant neoplasm of upper-outer quadrant of female breast [facility]) August 2015    pT1b, N0; M0. 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 Breast cancer [facility]) March 2015     Clinical T1c, N0, BRCA negative   Past Surgical History  Procedure Laterality Date  . Leep  1990's  . Excisional hemorrhoidectomy  2015  . Mastectomy Right 07/2014    chemo and herceptin tx  . Breast surgery Right July 10, 2014    Right simple mastectomy with sentinel node biopsy.  . Breast reconstruction Right 04/14/2015    Procedure: AEROLA/NIPPLE RECONSTRUCTION WITH GRAFT;  Surgeon: Nicholaus Bloom, MD;  Location: [place];  Service: Plastics;  Laterality: Right;  . Augmentation mammaplasty Bilateral Nov 2016    Dr Tula Nakayama  . Breast biopsy Right 02/2014    positive   Social History   Social History  . Marital Status: Married    Spouse Name: N/A  . Number of Children: N/A  . Years of Education: N/A   Occupational History  . Not on file.   Social History Main Topics  . Smoking status: Never Smoker   . Smokeless tobacco: Never Used  . Alcohol Use: No  . Drug Use: No  . Sexual Activity:  Not on file   Other Topics Concern  . Not on file   Social History Narrative   Social History   Social History Narrative     ROS: Negative.     PE: HEENT: Negative. Lungs: Clear. Cardio: RR.  Assessment/Plan:  Proceed with planned endoscopy.  Robert Bellow 05/04/2016   Assessment/Plan:  Proceed with planned endoscopy.

## 2016-05-04 NOTE — Op Note (Signed)
[facility] Gastroenterology Patient Name: Sandra Nguyen Procedure Date: 05/04/2016 7:54 AM MRN: [id number] Account #: [id number] Date of Birth: 05/23/1971 Admit Type: Outpatient Age: 45 Room: [facility] ENDO ROOM 1 Gender: Female Note Status: Finalized Procedure:            Colonoscopy Indications:          Screening for colorectal malignant neoplasm Providers:            Robert Bellow, MD Referring MD:         [name], MD (Referring MD) Medicines:            Monitored Anesthesia Care Complications:        No immediate complications. Procedure:            Pre-Anesthesia Assessment:                       - Prior to the procedure, a History and Physical was                        performed, and patient medications, allergies and                        sensitivities were reviewed. The patient's tolerance of                        previous anesthesia was reviewed.                       - The risks and benefits of the procedure and the                        sedation options and risks were discussed with the                        patient. All questions were answered and informed                        consent was obtained.                       After obtaining informed consent, the colonoscope was                        passed under direct vision. Throughout the procedure,                        the patient's blood pressure, pulse, and oxygen                        saturations were monitored continuously. The                        Colonoscope was introduced through the anus and                        advanced to the the terminal ileum. The colonoscopy was                        performed without difficulty. The patient tolerated the  procedure well. The quality of the bowel preparation                        was excellent. Findings:      The entire examined colon appeared normal on direct and retroflexion       views. Impression:            - The entire examined colon is normal on direct and                        retroflexion views.                       - No specimens collected. Recommendation:       - Repeat colonoscopy in 10 years for screening purposes. Procedure Code(s):    --- Professional ---                       [phone], Colonoscopy, flexible; diagnostic, including                        collection of specimen(s) by brushing or washing, when                        performed (separate procedure) Diagnosis Code(s):    --- Professional ---                       Z12.11, Encounter for screening for malignant neoplasm                        of colon CPT copyright 2016 American Medical Association. All rights reserved. The codes documented in this report are preliminary and upon coder review may  be revised to meet current compliance requirements. Robert Bellow, MD 05/04/2016 8:32:52 AM This report has been signed electronically. Number of Addenda: 0 Note Initiated On: 05/04/2016 7:54 AM Scope Withdrawal Time: 0 hours 8 minutes 58 seconds  Total Procedure Duration: 0 hours 18 minutes 29 seconds       [facility]

## 2016-05-04 NOTE — Anesthesia Postprocedure Evaluation (Signed)
Anesthesia Post Note  Patient: Sandra Nguyen  Procedure(s) Performed: Procedure(s) (LRB): COLONOSCOPY WITH PROPOFOL (N/A)  Patient location during evaluation: Endoscopy Anesthesia Type: General Level of consciousness: awake and alert Pain management: pain level controlled Vital Signs Assessment: post-procedure vital signs reviewed and stable Respiratory status: spontaneous breathing, nonlabored ventilation, respiratory function stable and patient connected to nasal cannula oxygen Cardiovascular status: blood pressure returned to baseline and stable Postop Assessment: no signs of nausea or vomiting Anesthetic complications: no    Last Vitals:  Filed Vitals:   05/04/16 0840 05/04/16 0850  BP: 100/79 100/64  Pulse: 74 66  Temp:    Resp: 15 19    Last Pain: There were no vitals filed for this visit.               Precious Haws Kikuye Korenek

## 2016-05-05 ENCOUNTER — Encounter: Payer: Self-pay | Admitting: General Surgery

## 2016-05-07 IMAGING — NM NUCLEAR MEDICINE CARDIAC MULTIPLE UPTAKE GATED ACQUISITION SCAN
7 series · 27 of 27 positions shown · non-contrast
Comparison: Mild scan 03/04/2014

CLINICAL DATA: High risk medication.  Cardiac function evaluation

EXAM:
NUCLEAR MEDICINE CARDIAC BLOOD POOL IMAGING (MUGA)
TECHNIQUE: Cardiac multi-gated acquisition was performed at rest following
intravenous injection of Oc-IIm labeled red blood cells.
RADIOPHARMACEUTICALS:  23.3 [id number]-GGm in-vitro labeled red blood
cells.

[Series 1000: lao 70 · 3.30mm/px · 1 of 1 slices shown]
[im 1/1]
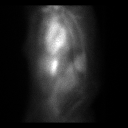

[Series 1000: ant-gated · 3.30mm/px · 6 of 24 frames shown]
[frame 3/24]
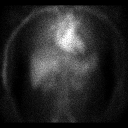
[frame 7/24]
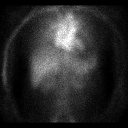
[frame 11/24]
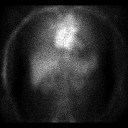
[frame 15/24]
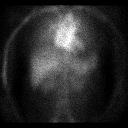
[frame 19/24]
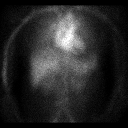
[frame 23/24]
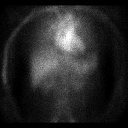

[Series 1000: lao 45 · 3.30mm/px · 1 of 1 slices shown]
[im 1/1]
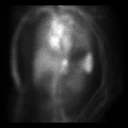

[Series 1000: lao 45-gated · 3.30mm/px · 6 of 24 frames shown]
[frame 3/24]
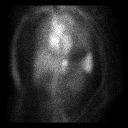
[frame 7/24]
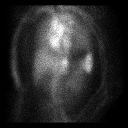
[frame 11/24]
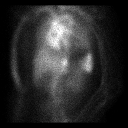
[frame 15/24]
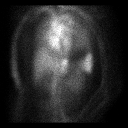
[frame 19/24]
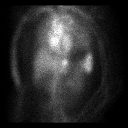
[frame 23/24]
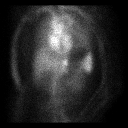

[Series 1000: lao 45-gated (results) · 3.30mm/px · 6 of 24 frames shown]
[frame 3/24]
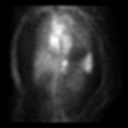
[frame 7/24]
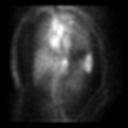
[frame 11/24]
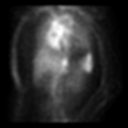
[frame 15/24]
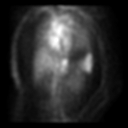
[frame 19/24]
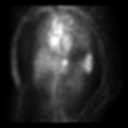
[frame 23/24]
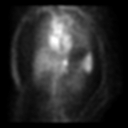

[Series 1000: lao 70-gated · 3.30mm/px · 6 of 24 frames shown]
[frame 3/24]
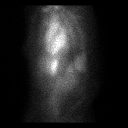
[frame 7/24]
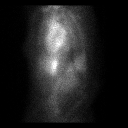
[frame 11/24]
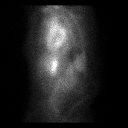
[frame 15/24]
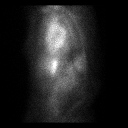
[frame 19/24]
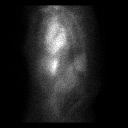
[frame 23/24]
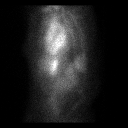

[Series 1000: ant · 3.30mm/px · 1 of 1 slices shown]
[im 1/1]
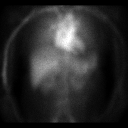

[27 of 27 positions shown; findings below may reference images not displayed]

FINDINGS: No focal wall motion abnormality of the left ventricle.

Calculated left ventricular ejection fraction equals 68%. This
compares to 65% on prior for age.
IMPRESSION: Left ventricular ejection fraction equals 68%, unchanged from prior.

## 2016-06-20 DIAGNOSIS — R87610 Atypical squamous cells of undetermined significance on cytologic smear of cervix (ASC-US): Secondary | ICD-10-CM

## 2016-06-20 HISTORY — DX: Atypical squamous cells of undetermined significance on cytologic smear of cervix (ASC-US): R87.610

## 2016-06-20 LAB — HM PAP SMEAR: HM PAP: NEGATIVE

## 2016-07-07 ENCOUNTER — Encounter: Payer: Self-pay | Admitting: Emergency Medicine

## 2016-07-07 ENCOUNTER — Ambulatory Visit: Payer: BC Managed Care – PPO | Admitting: General Surgery

## 2016-07-07 ENCOUNTER — Observation Stay
Admission: EM | Admit: 2016-07-07 | Discharge: 2016-07-09 | Disposition: A | Discharge status: Home / Self Care | Payer: BC Managed Care – PPO | Attending: Internal Medicine | Admitting: Internal Medicine

## 2016-07-07 DIAGNOSIS — Z881 Allergy status to other antibiotic agents status: Secondary | ICD-10-CM | POA: Diagnosis not present

## 2016-07-07 DIAGNOSIS — Z79899 Other long term (current) drug therapy: Secondary | ICD-10-CM | POA: Insufficient documentation

## 2016-07-07 DIAGNOSIS — Z79810 Long term (current) use of selective estrogen receptor modulators (SERMs): Secondary | ICD-10-CM | POA: Diagnosis not present

## 2016-07-07 DIAGNOSIS — Z9889 Other specified postprocedural states: Secondary | ICD-10-CM | POA: Insufficient documentation

## 2016-07-07 DIAGNOSIS — G629 Polyneuropathy, unspecified: Secondary | ICD-10-CM | POA: Insufficient documentation

## 2016-07-07 DIAGNOSIS — Z803 Family history of malignant neoplasm of breast: Secondary | ICD-10-CM | POA: Insufficient documentation

## 2016-07-07 DIAGNOSIS — L03313 Cellulitis of chest wall: Secondary | ICD-10-CM | POA: Diagnosis not present

## 2016-07-07 DIAGNOSIS — Z8049 Family history of malignant neoplasm of other genital organs: Secondary | ICD-10-CM | POA: Diagnosis not present

## 2016-07-07 DIAGNOSIS — L0390 Cellulitis, unspecified: Secondary | ICD-10-CM | POA: Diagnosis present

## 2016-07-07 DIAGNOSIS — Z9011 Acquired absence of right breast and nipple: Secondary | ICD-10-CM | POA: Diagnosis not present

## 2016-07-07 DIAGNOSIS — Z853 Personal history of malignant neoplasm of breast: Secondary | ICD-10-CM | POA: Insufficient documentation

## 2016-07-07 LAB — BASIC METABOLIC PANEL
ANION GAP: 7 (ref 5–15)
BUN: 12 mg/dL (ref 6–20)
CALCIUM: 9.1 mg/dL (ref 8.9–10.3)
CO2: 25 mmol/L (ref 22–32)
Chloride: 106 mmol/L (ref 101–111)
Creatinine, Ser: 0.9 mg/dL (ref 0.44–1.00)
GFR calc Af Amer: >60 mL/min (ref >60)
GFR calc non Af Amer: >60 mL/min (ref >60)
GLUCOSE: 119 mg/dL — AB (ref 65–99)
Potassium: 3.5 mmol/L (ref 3.5–5.1)
Sodium: 138 mmol/L (ref 135–145)

## 2016-07-07 LAB — CBC WITH DIFFERENTIAL/PLATELET
BASOS ABS: 0 10*3/uL (ref 0–0.1)
BASOS PCT: 0 %
Eosinophils Absolute: 0.1 10*3/uL (ref 0–0.7)
Eosinophils Relative: 2 %
HEMATOCRIT: 34.5 % — AB (ref 35.0–47.0)
HEMOGLOBIN: 11.8 g/dL — AB (ref 12.0–16.0)
LYMPHS PCT: 37 %
Lymphs Abs: 2.3 10*3/uL (ref 1.0–3.6)
MCH: 28.5 pg (ref 26.0–34.0)
MCHC: 34.1 g/dL (ref 32.0–36.0)
MCV: 83.7 fL (ref 80.0–100.0)
MONO ABS: 0.5 10*3/uL (ref 0.2–0.9)
MONOS PCT: 8 %
NEUTROS ABS: 3.3 10*3/uL (ref 1.4–6.5)
Neutrophils Relative %: 53 %
Platelets: 225 10*3/uL (ref 150–440)
RBC: 4.13 MIL/uL (ref 3.80–5.20)
RDW: 13.3 % (ref 11.5–14.5)
WBC: 6.1 10*3/uL (ref 3.6–11.0)

## 2016-07-07 MED ORDER — ACETAMINOPHEN 325 MG PO TABS
650.0000 mg | ORAL_TABLET | Freq: Four times a day (QID) | ORAL | Status: DC | PRN
  Administered 2016-07-07 – 2016-07-08 (×3): 650 mg via ORAL
  Filled 2016-07-07 (×3): qty 2

## 2016-07-07 MED ORDER — OXYCODONE HCL 5 MG PO TABS: 5.0000 mg | ORAL_TABLET | ORAL | Status: DC | PRN

## 2016-07-07 MED ORDER — ONDANSETRON HCL 4 MG/2ML IJ SOLN: 4.0000 mg | Freq: Four times a day (QID) | INTRAMUSCULAR | Status: DC | PRN

## 2016-07-07 MED ORDER — ONDANSETRON HCL 4 MG PO TABS: 4.0000 mg | ORAL_TABLET | Freq: Four times a day (QID) | ORAL | Status: DC | PRN

## 2016-07-07 MED ORDER — ENOXAPARIN SODIUM 40 MG/0.4ML ~~LOC~~ SOLN
40.0000 mg | SUBCUTANEOUS | Status: DC
  Administered 2016-07-07 – 2016-07-08 (×2): 40 mg via SUBCUTANEOUS
  Filled 2016-07-07 (×2): qty 0.4

## 2016-07-07 MED ORDER — ACETAMINOPHEN 650 MG RE SUPP: 650.0000 mg | Freq: Four times a day (QID) | RECTAL | Status: DC | PRN

## 2016-07-07 MED ORDER — TAMOXIFEN CITRATE 10 MG PO TABS
20.0000 mg | ORAL_TABLET | Freq: Every day | ORAL | Status: DC
Start: 2016-07-07 — End: 2016-07-09
  Administered 2016-07-07 – 2016-07-08 (×2): 20 mg via ORAL
  Filled 2016-07-07 (×3): qty 2

## 2016-07-07 MED ORDER — GABAPENTIN 300 MG PO CAPS
300.0000 mg | ORAL_CAPSULE | Freq: Every day | ORAL | Status: DC
  Administered 2016-07-07: 300 mg via ORAL
  Filled 2016-07-07 (×2): qty 1

## 2016-07-07 MED ORDER — MORPHINE SULFATE (PF) 2 MG/ML IV SOLN
2.0000 mg | INTRAVENOUS | Status: DC | PRN
  Administered 2016-07-07: 2 mg via INTRAVENOUS
  Filled 2016-07-07: qty 1

## 2016-07-07 MED ORDER — LEVOCETIRIZINE DIHYDROCHLORIDE 5 MG PO TABS: 5.0000 mg | ORAL_TABLET | Freq: Every evening | ORAL | Status: DC

## 2016-07-07 MED ORDER — BIOTIN 5 MG PO CAPS: 5000.0000 mg | ORAL_CAPSULE | Freq: Every day | ORAL | Status: DC

## 2016-07-07 MED ORDER — LORATADINE 10 MG PO TABS
10.0000 mg | ORAL_TABLET | Freq: Every day | ORAL | Status: DC
  Filled 2016-07-07 (×2): qty 1

## 2016-07-07 MED ORDER — CLINDAMYCIN PHOSPHATE 600 MG/50ML IV SOLN
600.0000 mg | Freq: Three times a day (TID) | INTRAVENOUS | Status: DC
  Administered 2016-07-07 – 2016-07-08 (×2): 600 mg via INTRAVENOUS
  Filled 2016-07-07 (×4): qty 50

## 2016-07-07 MED ORDER — CLINDAMYCIN PHOSPHATE 600 MG/50ML IV SOLN
600.0000 mg | Freq: Once | INTRAVENOUS | Status: AC
Start: 2016-07-07 — End: 2016-07-07
  Administered 2016-07-07: 600 mg via INTRAVENOUS
  Filled 2016-07-07: qty 50

## 2016-07-07 MED ORDER — DULOXETINE HCL 30 MG PO CPEP
30.0000 mg | ORAL_CAPSULE | Freq: Two times a day (BID) | ORAL | Status: DC
  Administered 2016-07-07 (×2): 30 mg via ORAL
  Filled 2016-07-07 (×5): qty 1

## 2016-07-07 NOTE — ED Triage Notes (Signed)
Pt c/o swelling, redness and pain of the right breast, states she had infection in the same area before since having breast reconstruction surgery.Marland Kitchen

## 2016-07-07 NOTE — H&P (Signed)
[place] [facility] [place] NAME: Sandra Nguyen    MR#:  [id number]  DATE OF BIRTH:  14-Feb-1971   DATE OF ADMISSION:  07/07/2016  PRIMARY CARE PHYSICIAN: [name], MD   REQUESTING/REFERRING PHYSICIAN: Lord  CHIEF COMPLAINT:   Chief Complaint  Patient presents with  . Recurrent Skin Infections    HISTORY OF PRESENT ILLNESS:  Sandra Nguyen  is a 45 y.o. female with a known history of Breast cancer status post reconstruction approximately one year ago who is presenting with redness over reconstructed area. She states for about 1-2 days she's had been having increasing redness around the healed incision site no drainage subjective chills denies fevers positive for weakness and fatigue. With the above symptoms present Hospital further workup and evaluation. She does state that it is somewhat painful qualifier only as "pain" for/10 in intensity no worsening or relieving factors. Emergency department staff discussed case with her plastic surgeon Dr. Tula Nakayama, who has privileges at this facility to discuss possible courses.  PAST MEDICAL HISTORY:   Past Medical History:  Diagnosis Date  . Anemia   . Breast cancer [facility]) March 2015    Clinical T1c, N0, BRCA negative  . Malignant neoplasm of upper-outer quadrant of female breast [facility]) August 2015   pT1b, N0; M0. 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 Migraines     PAST SURGICAL HISTORY:   Past Surgical History:  Procedure Laterality Date  . AUGMENTATION MAMMAPLASTY Bilateral Nov 2016   Dr Tula Nakayama  . BREAST BIOPSY Right 02/2014   positive  . BREAST RECONSTRUCTION Right 04/14/2015   Procedure: AEROLA/NIPPLE RECONSTRUCTION WITH GRAFT;  Surgeon: Nicholaus Bloom, MD;  Location: [facility];  Service: Plastics;  Laterality: Right;  . BREAST SURGERY Right July 10, 2014   Right simple mastectomy with sentinel node biopsy.  . COLONOSCOPY WITH PROPOFOL N/A 05/04/2016   Procedure: COLONOSCOPY  WITH PROPOFOL;  Surgeon: Robert Bellow, MD;  Location: [facility] ENDOSCOPY;  Service: Endoscopy;  Laterality: N/A;  . EXCISIONAL HEMORRHOIDECTOMY  2015  . LEEP  1990's  . MASTECTOMY Right 07/2014   chemo and herceptin tx    SOCIAL HISTORY:   Social History  Substance Use Topics  . Smoking status: Never Smoker  . Smokeless tobacco: Never Used  . Alcohol use No    FAMILY HISTORY:   Family History  Problem Relation Age of Onset  . Cancer Maternal Aunt     breast cancer, age greater than 51 at diagnosis  . Cancer Maternal Aunt     cervical    DRUG ALLERGIES:   Allergies  Allergen Reactions  . Keflex [Cephalexin] Rash    REVIEW OF SYSTEMS:  REVIEW OF SYSTEMS:  CONSTITUTIONAL: Denies fevers,Positive chills, fatigue, weakness.  EYES: Denies blurred vision, double vision, or eye pain.  EARS, NOSE, THROAT: Denies tinnitus, ear pain, hearing loss.  RESPIRATORY: denies cough, shortness of breath, wheezing  CARDIOVASCULAR: Denies chest pain, palpitations, edema.  GASTROINTESTINAL: Denies nausea, vomiting, diarrhea, abdominal pain.  GENITOURINARY: Denies dysuria, hematuria.  ENDOCRINE: Denies nocturia or thyroid problems. HEMATOLOGIC AND LYMPHATIC: Denies easy bruising or bleeding.  SKIN: Redness over breast reconstruction otherwise Denies rash or lesions.  MUSCULOSKELETAL: Denies pain in neck, back, shoulder, knees, hips, or further arthritic symptoms.  NEUROLOGIC: Denies paralysis, paresthesias.  PSYCHIATRIC: Denies anxiety or depressive symptoms. Otherwise full review of systems performed by me is negative.   MEDICATIONS AT HOME:   Prior to Admission medications   Medication Sig Start Date End  Date Taking? Authorizing Provider  BIOTIN 5000 Nguyen Take 5,000 mg by mouth daily. 7 PM   Yes Historical Provider, MD  clonazePAM (KLONOPIN) 0.5 MG tablet Take 0.5 mg by mouth at bedtime as needed for anxiety.   Yes Historical Provider, MD  DULoxetine (CYMBALTA) 30 MG capsule Take 30  mg by mouth 2 (two) times daily.   Yes Historical Provider, MD  gabapentin (NEURONTIN) 300 MG capsule Take 300 mg by mouth at bedtime.   Yes Historical Provider, MD  levocetirizine (XYZAL) 5 MG tablet Take 5 mg by mouth every evening.   Yes Historical Provider, MD  tamoxifen (NOLVADEX) 20 MG tablet Take 1 tablet (20 mg total) by mouth daily. 04/13/16  Yes Forest Gleason, MD  Vitamin D, Cholecalciferol, 1000 UNITS TABS Take by mouth. 7 PM   Yes Historical Provider, MD      VITAL SIGNS:  Blood pressure 110/70, pulse 71, temperature 99.1 F (37.3 C), temperature source Oral, resp. rate 18, height '5\' 3"'  (1.6 m), SpO2 99 %.  PHYSICAL EXAMINATION:  VITAL SIGNS: Vitals:   07/07/16 0942 07/07/16 1100  BP: 123/77 110/70  Pulse: 87 71  Resp: 18   Temp: 99.1 F (37.3 C)    GENERAL:44 y.o.female currently in no acute distress.  HEAD: Normocephalic, atraumatic.  EYES: Pupils equal, round, reactive to light. Extraocular muscles intact. No scleral icterus.  MOUTH: Moist mucosal membrane. Dentition intact. No abscess noted.  EAR, NOSE, THROAT: Clear without exudates. No external lesions.  NECK: Supple. No thyromegaly. No nodules. No JVD.  PULMONARY: Clear to ascultation, without wheeze rails or rhonci. No use of accessory muscles, Good respiratory effort. good air entry bilaterally CHEST: Nontender to palpation.  CARDIOVASCULAR: S1 and S2. Regular rate and rhythm. No murmurs, rubs, or gallops. No edema. Pedal pulses 2+ bilaterally.  GASTROINTESTINAL: Soft, nontender, nondistended. No masses. Positive bowel sounds. No hepatosplenomegaly.  MUSCULOSKELETAL: No swelling, clubbing, or edema. Range of motion full in all extremities.  NEUROLOGIC: Cranial nerves II through XII are intact. No gross focal neurological deficits. Sensation intact. Reflexes intact.  SKIN: Erythematous area warm to touch starting at medial aspect of right healed incision extending across the incision site, no drainage, and incision  well-healed otherwise No ulceration, lesions, rashes, or cyanosis. Skin warm and dry. Turgor intact.  PSYCHIATRIC: Mood, affect within normal limits. The patient is awake, alert and oriented x 3. Insight, judgment intact.    LABORATORY PANEL:   CBC  Recent Labs Lab 07/07/16 1003  WBC 6.1  HGB 11.8*  HCT 34.5*  PLT 225   ------------------------------------------------------------------------------------------------------------------  Chemistries  No results for input(s): NA, K, CL, CO2, GLUCOSE, BUN, CREATININE, CALCIUM, MG, AST, ALT, ALKPHOS, BILITOT in the last 168 hours.  Invalid input(s): GFRCGP ------------------------------------------------------------------------------------------------------------------  Cardiac Enzymes No results for input(s): TROPONINI in the last 168 hours. ------------------------------------------------------------------------------------------------------------------  RADIOLOGY:  No results found.  EKG:  No orders found for this or any previous visit.  IMPRESSION AND PLAN:   45 year old African-American female history of breast cancer status post reconstruction approximate one year ago presenting with chest wall redness  1. Chest wall cellulitis: Complication by prior breast reconstruction surgery. Erythema traces incision site however with surgery being so long ago unlikely directly related. Case discussed with plastic surgery who agree with IV antibiotics but no further intervention at this point. He is agreeable to see the patient if required. Obtain blood cultures, will start with clindamycin follow her for clinical improvement expected transition to oral antibiotics tomorrow with subsequent discharge/follow-up  as outpatient.    All the records are reviewed and case discussed with ED provider. Management plans discussed with the patient, family and they are in agreement.  CODE STATUS: Full  TOTAL TIME TAKING CARE OF THIS PATIENT: 33  minutes.    Hower,  Karenann Cai.D on 07/07/2016 at 11:44 AM  Between 7am to 6pm - Pager - [phone]  After 6pm: House Pager: - [phone]  [facility]  Office  [phone]  CC: Primary care physician; [name], MD

## 2016-07-07 NOTE — Progress Notes (Signed)
PHARMACIST - PHYSICIAN ORDER COMMUNICATION  CONCERNING: P&T Medication Policy on Herbal Medications  DESCRIPTION:  This patient's order for:  Biotin  has been noted.  This product(s) is classified as an "herbal" or natural product. Due to a lack of definitive safety studies or FDA approval, nonstandard manufacturing practices, plus the potential risk of unknown drug-drug interactions while on inpatient medications, the Pharmacy and Therapeutics Committee does not permit the use of "herbal" or natural products of this type within [place].   ACTION TAKEN: The pharmacy department is unable to verify this order at this time Please reevaluate patient's clinical condition at discharge and address if the herbal or natural product(s) should be resumed at that time.    

## 2016-07-07 NOTE — ED Notes (Signed)
MD at bedside. 

## 2016-07-07 NOTE — Progress Notes (Signed)
Dr Tula Nakayama: [phone]

## 2016-07-07 NOTE — ED Provider Notes (Signed)
[facility] Emergency Department Provider Note ____________________________________________  Time seen: I have reviewed the triage vital signs and the triage nursing note.  HISTORY  Chief Complaint Recurrent Skin Infections   Historian Patient  HPI Sandra Nguyen is a 45 y.o. female followed by oncology and surgery and plastic surgery for history of breast cancer, followed by right mastectomy, followed by breast implant about one year ago. She states that she did get a skin infection earlier this year, and was treated with antibiotics.  Yesterday she noticed redness and some discomfort and this morning the redness and swelling were much worse. No fevers or chills. She feels some generalized fatigue. No drainage.  Symptoms are moderate. Nothing makes it worse or better.    Past Medical History:  Diagnosis Date  . Anemia   . Breast cancer [facility]) March 2015    Clinical T1c, N0, BRCA negative  . Malignant neoplasm of upper-outer quadrant of female breast [facility]) August 2015   pT1b, N0; M0. 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 Migraines     Patient Active Problem List   Diagnosis Date Noted  . Cellulitis 07/07/2016  . Encounter for screening colonoscopy 02/26/2016  . Left breast mass 06/30/2015  . Breast cancer of upper-outer quadrant of right female breast ([place]) 02/18/2014    Past Surgical History:  Procedure Laterality Date  . AUGMENTATION MAMMAPLASTY Bilateral Nov 2016   Dr Tula Nakayama  . BREAST BIOPSY Right 02/2014   positive  . BREAST RECONSTRUCTION Right 04/14/2015   Procedure: AEROLA/NIPPLE RECONSTRUCTION WITH GRAFT;  Surgeon: Nicholaus Bloom, MD;  Location: [place];  Service: Plastics;  Laterality: Right;  . BREAST SURGERY Right July 10, 2014   Right simple mastectomy with sentinel node biopsy.  . COLONOSCOPY WITH PROPOFOL N/A 05/04/2016   Procedure: COLONOSCOPY WITH PROPOFOL;  Surgeon: Robert Bellow, MD;  Location: [facility]  ENDOSCOPY;  Service: Endoscopy;  Laterality: N/A;  . EXCISIONAL HEMORRHOIDECTOMY  2015  . LEEP  1990's  . MASTECTOMY Right 07/2014   chemo and herceptin tx    Prior to Admission medications   Medication Sig Start Date End Date Taking? Authorizing Provider  BIOTIN 5000 PO Take 5,000 mg by mouth daily. 7 PM   Yes Historical Provider, MD  clonazePAM (KLONOPIN) 0.5 MG tablet Take 0.5 mg by mouth at bedtime as needed for anxiety.   Yes Historical Provider, MD  DULoxetine (CYMBALTA) 30 MG capsule Take 30 mg by mouth 2 (two) times daily.   Yes Historical Provider, MD  gabapentin (NEURONTIN) 300 MG capsule Take 300 mg by mouth at bedtime.   Yes Historical Provider, MD  levocetirizine (XYZAL) 5 MG tablet Take 5 mg by mouth every evening.   Yes Historical Provider, MD  tamoxifen (NOLVADEX) 20 MG tablet Take 1 tablet (20 mg total) by mouth daily. 04/13/16  Yes Forest Gleason, MD  Vitamin D, Cholecalciferol, 1000 UNITS TABS Take by mouth. 7 PM   Yes Historical Provider, MD    Allergies  Allergen Reactions  . Keflex [Cephalexin] Rash    Family History  Problem Relation Age of Onset  . Cancer Maternal Aunt     breast cancer, age greater than 50 at diagnosis  . Cancer Maternal Aunt     cervical    Social History Social History  Substance Use Topics  . Smoking status: Never Smoker  . Smokeless tobacco: Never Used  . Alcohol use No    Review of Systems  Constitutional: Negative for fever. Eyes: Negative for visual  changes. ENT: Negative for sore throat. Cardiovascular: Negative for chest pain. Respiratory: Negative for shortness of breath. Gastrointestinal: Negative for abdominal pain, vomiting and diarrhea. Genitourinary: Negative for dysuria. Musculoskeletal: Negative for back pain. Skin: Redness as per history of present illness Neurological: Negative for headache. 10 point Review of Systems otherwise negative ____________________________________________   PHYSICAL EXAM:  VITAL  SIGNS: ED Triage Vitals  Enc Vitals Group     BP 07/07/16 0942 123/77     Pulse Rate 07/07/16 0942 87     Resp 07/07/16 0942 18     Temp 07/07/16 0942 99.1 F (37.3 C)     Temp Source 07/07/16 0942 Oral     SpO2 07/07/16 0942 95 %     Weight --      Height 07/07/16 0942 '5\' 3"'  (1.6 m)     Head Circumference --      Peak Flow --      Pain Score 07/07/16 0944 0     Pain Loc --      Pain Edu? --      Excl. in [place]? --      Constitutional: Alert and oriented. Well appearing and in no distress. HEENT   Head: Normocephalic and atraumatic.      Eyes: Conjunctivae are normal. PERRL. Normal extraocular movements.      Ears:         Nose: No congestion/rhinnorhea.   Mouth/Throat: Mucous membranes are moist.   Neck: No stridor. Cardiovascular/Chest: Normal rate, regular rhythm.  No murmurs, rubs, or gallops.  Right breast area cellulitis surrounding the medial area of the scar, and extending across the entire lower border of the augmented breast.   Some induration, no drainage or wound dehiscence. No area of fluctuance. In. Respiratory: Normal respiratory effort without tachypnea nor retractions. Breath sounds are clear and equal bilaterally. No wheezes/rales/rhonchi. Gastrointestinal: Soft. No distention, no guarding, no rebound. Nontender.    Genitourinary/rectal:Deferred Musculoskeletal: Nontender with normal range of motion in all extremities. No joint effusions.  No lower extremity tenderness.  No edema. Neurologic:  Normal speech and language. No gross or focal neurologic deficits are appreciated. Skin:  Skin is warm, dry and intact. No rash noted. Psychiatric: Mood and affect are normal. Speech and behavior are normal. Patient exhibits appropriate insight and judgment.  ____________________________________________   EKG I, Lisa Roca, MD, the attending physician have personally viewed and interpreted all ECGs.  None ____________________________________________  LABS  (pertinent positives/negatives)  Labs Reviewed  CBC WITH DIFFERENTIAL/PLATELET - Abnormal; Notable for the following:       Result Value   Hemoglobin 11.8 (*)    HCT 34.5 (*)    All other components within normal limits  CULTURE, BLOOD (ROUTINE X 2)  CULTURE, BLOOD (ROUTINE X 2)  BASIC METABOLIC PANEL  CBC  CREATININE, SERUM    ____________________________________________  RADIOLOGY All Xrays were viewed by me. Imaging interpreted by Radiologist.  None __________________________________________  PROCEDURES  Procedure(s) performed: None  Critical Care performed: None  ____________________________________________   ED COURSE / ASSESSMENT AND PLAN  Pertinent labs & imaging results that were available during my care of the patient were reviewed by me and considered in my medical decision making (see chart for details).   She does have a cellulitis now, without any evidence of underlying abscess on exam right now.  She does not appear to be septic. However, given the extent of the area of the cellulitis, I will admit for IV antibiotics.  I spoke with  her plastic surgeon, Dr. Tula Nakayama who recommends also hospital admission with IV antibiotics and infectious disease consultation given the underlying breast implant. He does have privileges at this hospital and will come to hospital to check on this patient.  I spoke with Dr. Megan Salon, with infectious disease who initially recommended Keflex and doxycycline, however given patient's allergy to Keflex, patient will be given clindamycin.  I spoke with Dr. Lavetta Nielsen, hospitalist for admission.   CONSULTATIONS:   Psychiatric nurse by phone, ID by phone, and hospitalist face to face.   Patient / Family / Caregiver informed of clinical course, medical decision-making process, and agree with plan.    ___________________________________________   FINAL CLINICAL IMPRESSION(S) / ED DIAGNOSES   Final diagnoses:  Cellulitis of chest  wall              Note: This dictation was prepared with Dragon dictation. Any transcriptional errors that result from this process are unintentional    Lisa Roca, MD 07/07/16 1144

## 2016-07-08 LAB — BASIC METABOLIC PANEL
ANION GAP: 6 (ref 5–15)
BUN: 10 mg/dL (ref 6–20)
CHLORIDE: 108 mmol/L (ref 101–111)
CO2: 26 mmol/L (ref 22–32)
Calcium: 8.7 mg/dL — ABNORMAL LOW (ref 8.9–10.3)
Creatinine, Ser: 0.91 mg/dL (ref 0.44–1.00)
GFR calc Af Amer: >60 mL/min (ref >60)
GLUCOSE: 98 mg/dL (ref 65–99)
POTASSIUM: 3.7 mmol/L (ref 3.5–5.1)
Sodium: 140 mmol/L (ref 135–145)

## 2016-07-08 LAB — CBC
HEMATOCRIT: 33.2 % — AB (ref 35.0–47.0)
HEMOGLOBIN: 11.4 g/dL — AB (ref 12.0–16.0)
MCH: 28.4 pg (ref 26.0–34.0)
MCHC: 34.3 g/dL (ref 32.0–36.0)
MCV: 82.9 fL (ref 80.0–100.0)
Platelets: 209 10*3/uL (ref 150–440)
RBC: 4.01 MIL/uL (ref 3.80–5.20)
RDW: 13.3 % (ref 11.5–14.5)
WBC: 5.2 10*3/uL (ref 3.6–11.0)

## 2016-07-08 MED ORDER — VANCOMYCIN HCL IN DEXTROSE 1-5 GM/200ML-% IV SOLN
1000.0000 mg | Freq: Once | INTRAVENOUS | Status: AC
  Administered 2016-07-08: 1000 mg via INTRAVENOUS
  Filled 2016-07-08: qty 200

## 2016-07-08 MED ORDER — VANCOMYCIN HCL IN DEXTROSE 750-5 MG/150ML-% IV SOLN
750.0000 mg | Freq: Two times a day (BID) | INTRAVENOUS | Status: DC
  Administered 2016-07-08: 750 mg via INTRAVENOUS
  Filled 2016-07-08 (×3): qty 150

## 2016-07-08 MED ORDER — SODIUM CHLORIDE 0.9 % IV SOLN
3.0000 g | Freq: Four times a day (QID) | INTRAVENOUS | Status: DC
  Administered 2016-07-08 – 2016-07-09 (×4): 3 g via INTRAVENOUS
  Filled 2016-07-08 (×7): qty 3

## 2016-07-08 MED ORDER — CLONAZEPAM 0.5 MG PO TABS
0.5000 mg | ORAL_TABLET | Freq: Three times a day (TID) | ORAL | Status: DC | PRN
  Administered 2016-07-08: 0.5 mg via ORAL
  Filled 2016-07-08: qty 1

## 2016-07-08 NOTE — Progress Notes (Signed)
TO WHOMSOEVER IT MAY CONCERN:   Mr. Tennis Ship has been at [facility] attending his wife who is medically ill and admitted here on 07/08/16. So, please excuse him from work for this day.  Please call if any questions.  Thank you.  Gladstone Lighter, M.D [facility] [place], [place] [address] Ph: [phone]

## 2016-07-08 NOTE — Plan of Care (Signed)
Problem: Skin Integrity: Goal: Risk for impaired skin integrity will decrease Outcome: Progressing On new abx. Will monitor for improved s/s of incfection

## 2016-07-08 NOTE — Progress Notes (Signed)
[place] [facility] [place] NAME: Sandra Nguyen    MR#:  [id number]  DATE OF BIRTH:  [birth year]  SUBJECTIVE:  CHIEF COMPLAINT:   Chief Complaint  Patient presents with  . Recurrent Skin Infections   - Very pleasant patient, right breast mastitis, remote history of breast reconstruction surgery on that side - still very swollen and tender in the inner lower quadrants-- not fluctuant yet  REVIEW OF SYSTEMS:  Review of Systems  Constitutional: Positive for fever. Negative for chills and malaise/fatigue.  HENT: Negative for ear discharge, ear pain and nosebleeds.   Eyes: Negative for blurred vision and double vision.  Respiratory: Negative for cough, shortness of breath and wheezing.   Cardiovascular: Negative for chest pain, palpitations and leg swelling.  Gastrointestinal: Negative for abdominal pain, constipation, diarrhea, nausea and vomiting.  Genitourinary: Negative for dysuria and urgency.  Musculoskeletal: Positive for myalgias.  Skin: Positive for rash.  Neurological: Negative for dizziness, speech change, focal weakness, seizures and headaches.  Psychiatric/Behavioral: Negative for depression.    DRUG ALLERGIES:   Allergies  Allergen Reactions  . Keflex [Cephalexin] Rash    VITALS:  Blood pressure (!) 100/57, pulse 76, temperature 98.1 F (36.7 C), temperature source Oral, resp. rate 18, height 5\' 3"  (1.6 m), weight 70.3 kg (155 lb), SpO2 98 %.  PHYSICAL EXAMINATION:  Physical Exam  GENERAL:  45 y.o.-year-old patient lying in the bed with no acute distress.  EYES: Pupils equal, round, reactive to light and accommodation. No scleral icterus. Extraocular muscles intact.  HEENT: Head atraumatic, normocephalic. Oropharynx and nasopharynx clear.  NECK:  Supple, no jugular venous distention. No thyroid enlargement, no tenderness.  LUNGS: Normal breath sounds bilaterally, no wheezing, rales,rhonchi or crepitation. No use of  accessory muscles of respiration.  BREASTS- normal left breast, right breast inner lower quadrant is swollen, tender, and slightly fluctuant. No axillary lymphadenopathy CARDIOVASCULAR: S1, S2 normal. No murmurs, rubs, or gallops.  ABDOMEN: Soft, nontender, nondistended. Bowel sounds present. No organomegaly or mass.  EXTREMITIES: No pedal edema, cyanosis, or clubbing.  NEUROLOGIC: Cranial nerves II through XII are intact. Muscle strength 5/5 in all extremities. Sensation intact. Gait not checked.  PSYCHIATRIC: The patient is alert and oriented x 3.  SKIN: No obvious rash, lesion, or ulcer.    LABORATORY PANEL:   CBC  Recent Labs Lab 07/08/16 0530  WBC 5.2  HGB 11.4*  HCT 33.2*  PLT 209   ------------------------------------------------------------------------------------------------------------------  Chemistries   Recent Labs Lab 07/08/16 0530  NA 140  K 3.7  CL 108  CO2 26  GLUCOSE 98  BUN 10  CREATININE 0.91  CALCIUM 8.7*   ------------------------------------------------------------------------------------------------------------------  Cardiac Enzymes No results for input(s): TROPONINI in the last 168 hours. ------------------------------------------------------------------------------------------------------------------  RADIOLOGY:  No results found.  EKG:  No orders found for this or any previous visit.  ASSESSMENT AND PLAN:   45 year old female with past medical history significant for history of breast cancer status post mastectomy and had to reconstructed breast comes to the hospital secondary to chest wall cellulitis.  #1 Right chest wall cellulitis in the breast reconstructed tissue- still very tender and swollen and low grade temps - Plastic surgery consult pending to see if drainage is needed - change to vancomycin and unasyn ABX today - pain control  #2 Neuropathy- on gabapentin and cymbalta  #3 H/o Breast cancer- s/p mastectomy and on  tamoxifen  #4 DVT Prophylaxis- on lovenox    All the records are  reviewed and case discussed with Care Management/Social Workerr. Management plans discussed with the patient, family and they are in agreement.  CODE STATUS: Full Code  TOTAL TIME TAKING CARE OF THIS PATIENT: 37 minutes.   POSSIBLE D/C IN 1-2 DAYS, DEPENDING ON CLINICAL CONDITION.   Gladstone Lighter M.D on 07/08/2016 at 10:59 AM  Between 7am to 6pm - Pager - [phone]  After 6pm go to [email or web address] - password [email or web address] [facility]  Office  [phone]  CC: Primary care physician; [name], MD

## 2016-07-08 NOTE — Consult Note (Signed)
Pharmacy Antibiotic Note  Sandra Nguyen is a 45 y.o. female admitted on 07/07/2016 with cellulitis.  Pharmacy has been consulted for vancomycin/unasyn dosing.  Plan: Will give vancomycin 1g once. Will give next dose in 11 hours for stacked dosing. Vancomycin 750mg  IV every 12 hours.  Goal trough 10-15 mcg/mL. unasyn 3g q 6 hours  Pt does have a keflex allergy, rash, low chance of cross reactivity. RN called and asked to monitor pt.  Vanc trough at steady state 8/6 @ 1030.  Height: 5\' 3"  (160 cm) Weight: 155 lb (70.3 kg) IBW/kg (Calculated) : 52.4  Temp (24hrs), Avg:98.3 F (36.8 C), Min:98.1 F (36.7 C), Max:98.5 F (36.9 C)   Recent Labs Lab 07/07/16 1003 07/08/16 0530  WBC 6.1 5.2  CREATININE 0.90 0.91    Estimated Creatinine Clearance: 72.7 mL/min (by C-G formula based on SCr of 0.91 mg/dL).    Allergies  Allergen Reactions  . Keflex [Cephalexin] Rash    Antimicrobials this admission: clindamycin 8/3 >> 8/4 vancomycin 8/4 >>  unasyn 8/4>>  Dose adjustments this admission:   Microbiology results: 8/3 BCx:    Thank you for allowing pharmacy to be a part of this patient's care.  Ramond Dial, Pharm.D Clinical Pharmacist  07/08/2016 11:10 AM

## 2016-07-09 LAB — BASIC METABOLIC PANEL
Anion gap: 7 (ref 5–15)
BUN: 7 mg/dL (ref 6–20)
CALCIUM: 8.6 mg/dL — AB (ref 8.9–10.3)
CHLORIDE: 109 mmol/L (ref 101–111)
CO2: 25 mmol/L (ref 22–32)
CREATININE: 0.74 mg/dL (ref 0.44–1.00)
GFR calc Af Amer: >60 mL/min (ref >60)
GFR calc non Af Amer: >60 mL/min (ref >60)
GLUCOSE: 86 mg/dL (ref 65–99)
Potassium: 3.6 mmol/L (ref 3.5–5.1)
Sodium: 141 mmol/L (ref 135–145)

## 2016-07-09 LAB — CBC
HCT: 33.4 % — ABNORMAL LOW (ref 35.0–47.0)
HEMOGLOBIN: 11.5 g/dL — AB (ref 12.0–16.0)
MCH: 28.4 pg (ref 26.0–34.0)
MCHC: 34.3 g/dL (ref 32.0–36.0)
MCV: 82.8 fL (ref 80.0–100.0)
PLATELETS: 206 10*3/uL (ref 150–440)
RBC: 4.03 MIL/uL (ref 3.80–5.20)
RDW: 13 % (ref 11.5–14.5)
WBC: 4.6 10*3/uL (ref 3.6–11.0)

## 2016-07-09 MED ORDER — AMOXICILLIN-POT CLAVULANATE 875-125 MG PO TABS
1.0000 | ORAL_TABLET | Freq: Two times a day (BID) | ORAL | Status: DC
  Administered 2016-07-09: 1 via ORAL
  Filled 2016-07-09: qty 1

## 2016-07-09 MED ORDER — AMOXICILLIN-POT CLAVULANATE 875-125 MG PO TABS: 1.0000 | ORAL_TABLET | Freq: Two times a day (BID) | ORAL | 0 refills | Status: DC

## 2016-07-09 MED ORDER — OXYCODONE HCL 5 MG PO TABS: 5.0000 mg | ORAL_TABLET | ORAL | 0 refills | Status: DC | PRN

## 2016-07-09 NOTE — Progress Notes (Signed)
Pt said she "wanted to be discharged and did not want to wait to be seen by the plastic doctor." Doctor Posey Pronto was made aware of pt statement and was given Dr. Tula Nakayama cell number to give him an update on pt's condition. Pt was asked to go for a follow up with Dr. Tula Nakayama on Monday 07/11/2016. Pt verbalized understanding of the importance of the follow up and will attend on Monday 07/11/2016. Patient discharge teaching given, including activity, diet, and medications. Patient verbalized understanding of all discharge instructions. IV access was d/c'd. Vitals are stable. Skin is intact except as charted in most recent assessments. Pt to be escorted out by RN, to be driven home by husband.  Sandra Nguyen

## 2016-07-09 NOTE — Discharge Summary (Signed)
[facility] [place] NAME: Sandra Nguyen    MR#:  [id number]  DATE OF BIRTH:  [birth year]  DATE OF ADMISSION:  07/07/2016 ADMITTING PHYSICIAN: Lytle Butte, MD  DATE OF DISCHARGE: 07/09/16  PRIMARY CARE PHYSICIAN: [name], MD    ADMISSION DIAGNOSIS:  Cellulitis of chest wall [L03.313]  DISCHARGE DIAGNOSIS:  Cellulitis right breast H/o right breast reconstruction surgery  SECONDARY DIAGNOSIS:   Past Medical History:  Diagnosis Date  . Anemia   . Breast cancer [facility]) March 2015    Clinical T1c, N0, BRCA negative  . Malignant neoplasm of upper-outer quadrant of female breast [facility]) August 2015   pT1b, N0; M0. 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 Migraines     HOSPITAL COURSE:   45 year old female with past medical history significant for history of breast cancer status post mastectomy and had to reconstructed breast comes to the hospital secondary to chest wall cellulitis.  #1 Right chest wall cellulitis in the breast reconstructed tissue -improved with tenderness and redness -no fever, no fluctuant mass a - Plastic surgery consult pending  -BC negative for 2 days -Change to oral augmentin - pain control better  #2 Neuropathy- on gabapentin and cymbalta  #3 H/o Breast cancer- s/p mastectomy and on tamoxifen  #4 DVT Prophylaxis- on lovenox  D/c home later today after seen by plastic surgery if no further w/u needed Pt agreeable CONSULTS OBTAINED:  Treatment Team:  Lytle Butte, MD Nicholaus Bloom, MD  DRUG ALLERGIES:   Allergies  Allergen Reactions  . Keflex [Cephalexin] Rash    DISCHARGE MEDICATIONS:   Current Discharge Medication List    START taking these medications   Details  amoxicillin-clavulanate (AUGMENTIN) 875-125 MG tablet Take 1 tablet by mouth every 12 (twelve) hours. Qty: 16 tablet, Refills: 0    oxyCODONE (OXY IR/ROXICODONE) 5 MG immediate release tablet Take 1 tablet  (5 mg total) by mouth every 4 (four) hours as needed for moderate pain. Qty: 30 tablet, Refills: 0      CONTINUE these medications which have NOT CHANGED   Details  BIOTIN 5000 PO Take 5,000 mg by mouth daily. 7 PM    clonazePAM (KLONOPIN) 0.5 MG tablet Take 0.5 mg by mouth at bedtime as needed for anxiety.    DULoxetine (CYMBALTA) 30 MG capsule Take 30 mg by mouth 2 (two) times daily.    gabapentin (NEURONTIN) 300 MG capsule Take 300 mg by mouth at bedtime.    levocetirizine (XYZAL) 5 MG tablet Take 5 mg by mouth every evening.    tamoxifen (NOLVADEX) 20 MG tablet Take 1 tablet (20 mg total) by mouth daily. Qty: 30 tablet, Refills: 6    Vitamin D, Cholecalciferol, 1000 UNITS TABS Take by mouth. 7 PM        If you experience worsening of your admission symptoms, develop shortness of breath, life threatening emergency, suicidal or homicidal thoughts you must seek medical attention immediately by calling 911 or calling your MD immediately  if symptoms less severe.  You Must read complete instructions/literature along with all the possible adverse reactions/side effects for all the Medicines you take and that have been prescribed to you. Take any new Medicines after you have completely understood and accept all the possible adverse reactions/side effects.   Please note  You were cared for by a hospitalist during your hospital stay. If you have any questions about your discharge medications or the care you received while you were in the  hospital after you are discharged, you can call the unit and asked to speak with the hospitalist on call if the hospitalist that took care of you is not available. Once you are discharged, your primary care physician will handle any further medical issues. Please note that NO REFILLS for any discharge medications will be authorized once you are discharged, as it is imperative that you return to your primary care physician (or establish a relationship with a  primary care physician if you do not have one) for your aftercare needs so that they can reassess your need for medications and monitor your lab values. Today   SUBJECTIVE   Feels a lot better  VITAL SIGNS:  Blood pressure 111/70, pulse 72, temperature 98.2 F (36.8 C), temperature source Oral, resp. rate 20, height '5\' 3"'  (1.6 m), weight 70.3 kg (155 lb), SpO2 98 %.  I/O:   Intake/Output Summary (Last 24 hours) at 07/09/16 0846 Last data filed at 07/09/16 0535  Gross per 24 hour  Intake              680 ml  Output              800 ml  Net             -120 ml    PHYSICAL EXAMINATION:  GENERAL:  45 y.o.-year-old patient lying in the bed with no acute distress.  EYES: Pupils equal, round, reactive to light and accommodation. No scleral icterus. Extraocular muscles intact.  HEENT: Head atraumatic, normocephalic. Oropharynx and nasopharynx clear.  NECK:  Supple, no jugular venous distention. No thyroid enlargement, no tenderness.  LUNGS: Normal breath sounds bilaterally, no wheezing, rales,rhonchi or crepitation. No use of accessory muscles of respiration. Right breast redness resolved. Very minimal tenderness in there right lower quadrant. No mass felt CARDIOVASCULAR: S1, S2 normal. No murmurs, rubs, or gallops.  ABDOMEN: Soft, non-tender, non-distended. Bowel sounds present. No organomegaly or mass.  EXTREMITIES: No pedal edema, cyanosis, or clubbing.  NEUROLOGIC: Cranial nerves II through XII are intact. Muscle strength 5/5 in all extremities. Sensation intact. Gait not checked.  PSYCHIATRIC:patient is alert and oriented x 3.  SKIN: No obvious rash, lesion, or ulcer.   DATA REVIEW:   CBC   Recent Labs Lab 07/09/16 0601  WBC 4.6  HGB 11.5*  HCT 33.4*  PLT 206    Chemistries   Recent Labs Lab 07/09/16 0601  NA 141  K 3.6  CL 109  CO2 25  GLUCOSE 86  BUN 7  CREATININE 0.74  CALCIUM 8.6*    Microbiology Results   Recent Results (from the past 240 hour(s))   Culture, blood (routine x 2)     Status: None (Preliminary result)   Collection Time: 07/07/16 10:03 AM  Result Value Ref Range Status   Specimen Description BLOOD LEFT HAND  Final   Special Requests   Final    BOTTLES DRAWN AEROBIC AND ANAEROBIC AER 2ML,ANA 1ML   Culture NO GROWTH 2 DAYS  Final   Report Status PENDING  Incomplete  Culture, blood (routine x 2)     Status: None (Preliminary result)   Collection Time: 07/07/16 11:35 AM  Result Value Ref Range Status   Specimen Description BLOOD RIGHT AC  Final   Special Requests   Final    BOTTLES DRAWN AEROBIC AND ANAEROBIC AER 4ML,ANA 5ML   Culture NO GROWTH 2 DAYS  Final   Report Status PENDING  Incomplete    RADIOLOGY:  No results  found.   Management plans discussed with the patient, family and they are in agreement.  CODE STATUS:     Code Status Orders        Start     Ordered   07/07/16 1128  Full code  Continuous     07/07/16 1127    Code Status History    Date Active Date Inactive Code Status Order ID Comments User Context   This patient has a current code status but no historical code status.      TOTAL TIME TAKING CARE OF THIS PATIENT: 40 minutes.    Tameisha Covell M.D on 07/09/2016 at 8:46 AM  Between 7am to 6pm - Pager - [phone] After 6pm go to [email or web address] - password [email or web address] [facility]  Office  [phone]  CC: Primary care physician; [name], MD

## 2016-07-12 ENCOUNTER — Encounter: Payer: Self-pay | Admitting: *Deleted

## 2016-07-19 ENCOUNTER — Ambulatory Visit: Payer: Self-pay | Admitting: General Surgery

## 2016-07-20 LAB — CULTURE, BLOOD (ROUTINE X 2)
CULTURE: NO GROWTH
Culture: NO GROWTH

## 2016-07-21 DIAGNOSIS — Z853 Personal history of malignant neoplasm of breast: Secondary | ICD-10-CM | POA: Insufficient documentation

## 2016-07-21 DIAGNOSIS — N61 Mastitis without abscess: Secondary | ICD-10-CM | POA: Insufficient documentation

## 2016-09-22 ENCOUNTER — Other Ambulatory Visit: Payer: Self-pay

## 2016-09-22 DIAGNOSIS — C50411 Malignant neoplasm of upper-outer quadrant of right female breast: Secondary | ICD-10-CM

## 2016-09-28 ENCOUNTER — Other Ambulatory Visit: Payer: BC Managed Care – PPO

## 2016-09-28 ENCOUNTER — Ambulatory Visit: Payer: BC Managed Care – PPO | Admitting: Oncology

## 2016-10-06 ENCOUNTER — Inpatient Hospital Stay (HOSPITAL_BASED_OUTPATIENT_CLINIC_OR_DEPARTMENT_OTHER): Payer: BC Managed Care – PPO

## 2016-10-06 ENCOUNTER — Inpatient Hospital Stay: Payer: BC Managed Care – PPO | Attending: Internal Medicine | Admitting: Internal Medicine

## 2016-10-06 ENCOUNTER — Encounter: Payer: Self-pay | Admitting: Internal Medicine

## 2016-10-06 ENCOUNTER — Encounter (INDEPENDENT_AMBULATORY_CARE_PROVIDER_SITE_OTHER): Payer: Self-pay

## 2016-10-06 VITALS — BP 129/81 | HR 69 | Temp 97.5°F | Resp 18 | Wt 154.4 lb

## 2016-10-06 DIAGNOSIS — M1990 Unspecified osteoarthritis, unspecified site: Secondary | ICD-10-CM | POA: Diagnosis not present

## 2016-10-06 DIAGNOSIS — Z79810 Long term (current) use of selective estrogen receptor modulators (SERMs): Secondary | ICD-10-CM | POA: Diagnosis not present

## 2016-10-06 DIAGNOSIS — Z9011 Acquired absence of right breast and nipple: Secondary | ICD-10-CM | POA: Diagnosis not present

## 2016-10-06 DIAGNOSIS — C50411 Malignant neoplasm of upper-outer quadrant of right female breast: Secondary | ICD-10-CM

## 2016-10-06 DIAGNOSIS — N951 Menopausal and female climacteric states: Secondary | ICD-10-CM | POA: Insufficient documentation

## 2016-10-06 DIAGNOSIS — Z170 Estrogen receptor positive status [ER+]: Secondary | ICD-10-CM | POA: Diagnosis not present

## 2016-10-06 LAB — COMPREHENSIVE METABOLIC PANEL
ALBUMIN: 3.9 g/dL (ref 3.5–5.0)
ALT: 12 U/L — ABNORMAL LOW (ref 14–54)
ANION GAP: 5 (ref 5–15)
AST: 22 U/L (ref 15–41)
Alkaline Phosphatase: 61 U/L (ref 38–126)
BILIRUBIN TOTAL: 0.3 mg/dL (ref 0.3–1.2)
BUN: 19 mg/dL (ref 6–20)
CHLORIDE: 109 mmol/L (ref 101–111)
CO2: 25 mmol/L (ref 22–32)
Calcium: 9.3 mg/dL (ref 8.9–10.3)
Creatinine, Ser: 0.93 mg/dL (ref 0.44–1.00)
GFR calc Af Amer: >60 mL/min (ref >60)
GFR calc non Af Amer: >60 mL/min (ref >60)
GLUCOSE: 110 mg/dL — AB (ref 65–99)
POTASSIUM: 3.7 mmol/L (ref 3.5–5.1)
SODIUM: 139 mmol/L (ref 135–145)
TOTAL PROTEIN: 7 g/dL (ref 6.5–8.1)

## 2016-10-06 LAB — CBC WITH DIFFERENTIAL/PLATELET
BASOS PCT: 0 %
Basophils Absolute: 0 10*3/uL (ref 0–0.1)
EOS ABS: 0.1 10*3/uL (ref 0–0.7)
EOS PCT: 2 %
HCT: 34 % — ABNORMAL LOW (ref 35.0–47.0)
Hemoglobin: 11.3 g/dL — ABNORMAL LOW (ref 12.0–16.0)
Lymphocytes Relative: 52 %
Lymphs Abs: 2.7 10*3/uL (ref 1.0–3.6)
MCH: 27.9 pg (ref 26.0–34.0)
MCHC: 33.3 g/dL (ref 32.0–36.0)
MCV: 83.7 fL (ref 80.0–100.0)
MONO ABS: 0.4 10*3/uL (ref 0.2–0.9)
MONOS PCT: 7 %
Neutro Abs: 2.1 10*3/uL (ref 1.4–6.5)
Neutrophils Relative %: 39 %
PLATELETS: 221 10*3/uL (ref 150–440)
RBC: 4.06 MIL/uL (ref 3.80–5.20)
RDW: 13.8 % (ref 11.5–14.5)
WBC: 5.3 10*3/uL (ref 3.6–11.0)

## 2016-10-06 MED ORDER — VENLAFAXINE HCL ER 37.5 MG PO CP24: 37.5000 mg | ORAL_CAPSULE | Freq: Every day | ORAL | 6 refills | Status: DC

## 2016-10-06 MED ORDER — ERGOCALCIFEROL 1.25 MG (50000 UT) PO CAPS: 50000.0000 [IU] | ORAL_CAPSULE | ORAL | 1 refills | Status: DC

## 2016-10-06 NOTE — Progress Notes (Signed)
[facility] OFFICE PROGRESS NOTE  Patient Care Team: [name], MD as PCP - General (Family Medicine) [name] Dear, MD (Inactive) (Family Medicine) Robert Bellow, MD (General Surgery) Forest Gleason, MD (Unknown Physician Specialty) Thornton Park, MD as Consulting Physician (Orthopedic Surgery)  Carcinoma of upper-outer quadrant of right breast in female, estrogen receptor positive [facility])   Staging form: Breast, AJCC 7th Edition   - Clinical: Stage IIA (T2, N0, M0) - Signed by Forest Gleason, MD on 05/09/2015   Oncology History   1. Carcinoma breast (right breast) 2 cm palpable mass Invasive mammary carcinoma.  Estrogen receptor positive.  Progesterone receptor positive.  HER-2/neu receptor amplified. 2. Started on neoadjuvant chemotherapy,  Taxotere, carboplatin and Herceptin and perjeta (March 05, 2014) 3. Patient does not have any clinically significant BRCA mutation Has been on and to [place] Mutation of unknown significance. . 4. 4 cycles of chemotherapy with TCH,perjeta previous did not June of 201 5.  Because of progressing side effect remaining 2 cycles of chemotherapy was not given but patient continued Herceptin and aperjeta  5. Status post right breast simple mastectomy in August of 2015 ypT1  ypN0 stage IB 6.patient started on tamoxifen in October of 2015 along with maintenance Herceptin therapy Patient is going for reconstructive surgery on October 21, 2014 final reconstructive surgery was done in February of 2016 [name] finished Herceptin therapy on March 11, 2015     Carcinoma of upper-outer quadrant of right breast in female, estrogen receptor positive ([place])   02/18/2014 Initial Diagnosis    Breast cancer of upper-outer quadrant of right female breast        This is my first interaction with the patient as patient's primary oncologist has been Dr.Choksi. I reviewed the patient's prior charts/pertinent labs/imaging in detail; findings are summarized above.     INTERVAL HISTORY:  Sandra Nguyen 45 y.o.  female pleasant patient above history of right-sided breast cancer stage II ER/PR positive HER-2/neu positive status post mastectomy is here for follow-up. Patient is tamoxifen.  Patient noted to have infection of her implants in August 2017- had both of them taken out.  Patient has intermittent pain in her left breast. No lumps or bumps noted. Denies any bone pain.  Complaints of hot flashes; most brings fatigue arthritis.   REVIEW OF SYSTEMS:  A complete 10 point review of system is done which is negative except mentioned above/history of present illness.   PAST MEDICAL HISTORY :  Past Medical History:  Diagnosis Date  . Anemia   . Breast cancer [facility]) March 2015    Clinical T1c, N0, BRCA negative  . Malignant neoplasm of upper-outer quadrant of female breast [facility]) August 2015   pT1b, N0; M0. 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 Migraines     PAST SURGICAL HISTORY :   Past Surgical History:  Procedure Laterality Date  . AUGMENTATION MAMMAPLASTY Bilateral Nov 2016   Dr Tula Nakayama  . BREAST BIOPSY Right 02/2014   positive  . BREAST RECONSTRUCTION Right 04/14/2015   Procedure: AEROLA/NIPPLE RECONSTRUCTION WITH GRAFT;  Surgeon: Nicholaus Bloom, MD;  Location: [facility];  Service: Plastics;  Laterality: Right;  . BREAST SURGERY Right July 10, 2014   Right simple mastectomy with sentinel node biopsy.  . COLONOSCOPY WITH PROPOFOL N/A 05/04/2016   Procedure: COLONOSCOPY WITH PROPOFOL;  Surgeon: Robert Bellow, MD;  Location: [facility] ENDOSCOPY;  Service: Endoscopy;  Laterality: N/A;  . EXCISIONAL HEMORRHOIDECTOMY  2015  . LEEP  1990's  .  MASTECTOMY Right 07/2014   chemo and herceptin tx    FAMILY HISTORY :   Family History  Problem Relation Age of Onset  . Cancer Maternal Aunt     breast cancer, age greater than 43 at diagnosis  . Cancer Maternal Aunt     cervical    SOCIAL HISTORY:   Social History  Substance  Use Topics  . Smoking status: Never Smoker  . Smokeless tobacco: Never Used  . Alcohol use No    ALLERGIES:  is allergic to keflex [cephalexin].  MEDICATIONS:  Current Outpatient Prescriptions  Medication Sig Dispense Refill  . BIOTIN 5000 PO Take 5,000 mg by mouth daily. 7 PM    . clonazePAM (KLONOPIN) 0.5 MG tablet Take 0.5 mg by mouth at bedtime as needed for anxiety.    Marland Kitchen levocetirizine (XYZAL) 5 MG tablet Take 5 mg by mouth every evening.    . tamoxifen (NOLVADEX) 20 MG tablet Take 1 tablet (20 mg total) by mouth daily. 30 tablet 6  . Vitamin D, Cholecalciferol, 1000 UNITS TABS Take by mouth. 7 PM    . ergocalciferol (VITAMIN D2) 50000 units capsule Take 1 capsule (50,000 Units total) by mouth once a week. 12 capsule 1  . gabapentin (NEURONTIN) 300 MG capsule Take 300 mg by mouth at bedtime.    Marland Kitchen oxyCODONE (OXY IR/ROXICODONE) 5 MG immediate release tablet Take 1 tablet (5 mg total) by mouth every 4 (four) hours as needed for moderate pain. (Patient not taking: Reported on 10/06/2016) 30 tablet 0  . venlafaxine XR (EFFEXOR-XR) 37.5 MG 24 hr capsule Take 1 capsule (37.5 mg total) by mouth daily with breakfast. 30 capsule 6   No current facility-administered medications for this visit.     PHYSICAL EXAMINATION: ECOG PERFORMANCE STATUS: 0 - Asymptomatic  BP 129/81 (BP Location: Left Arm, Patient Position: Sitting)   Pulse 69   Temp 97.5 F (36.4 C) (Tympanic)   Resp 18   Wt 154 lb 6.4 oz (70 kg)   SpO2 99%   BMI 27.35 kg/m   Filed Weights   10/06/16 1145  Weight: 154 lb 6.4 oz (70 kg)    GENERAL: Well-nourished well-developed; Alert, no distress and comfortable.   Alone.  EYES: no pallor or icterus OROPHARYNX: no thrush or ulceration; good dentition  NECK: supple, no masses felt LYMPH:  no palpable lymphadenopathy in the cervical, axillary or inguinal regions LUNGS: clear to auscultation and  No wheeze or crackles HEART/CVS: regular rate & rhythm and no murmurs; No  lower extremity edema ABDOMEN:abdomen soft, non-tender and normal bowel sounds Musculoskeletal:no cyanosis of digits and no clubbing  PSYCH: alert & oriented x 3 with fluent speech NEURO: no focal motor/sensory deficits SKIN:  no rashes or significant lesions  Right and left BREAST exam [in the presence of nurse]- right- mastectomy- no signs of infection; Left breast- no unusual skin changes or dominant masses felt.    LABORATORY DATA:  I have reviewed the data as listed    Component Value Date/Time   NA 139 10/06/2016 1104   NA 143 12/31/2014 1313   K 3.7 10/06/2016 1104   K 3.5 12/31/2014 1313   CL 109 10/06/2016 1104   CL 110 (H) 12/31/2014 1313   CO2 25 10/06/2016 1104   CO2 28 12/31/2014 1313   GLUCOSE 110 (H) 10/06/2016 1104   GLUCOSE 124 (H) 12/31/2014 1313   BUN 19 10/06/2016 1104   BUN 13 12/31/2014 1313   CREATININE 0.93 10/06/2016 1104  CREATININE 0.89 12/31/2014 1313   CALCIUM 9.3 10/06/2016 1104   CALCIUM 8.4 (L) 12/31/2014 1313   PROT 7.0 10/06/2016 1104   PROT 6.9 12/31/2014 1313   ALBUMIN 3.9 10/06/2016 1104   ALBUMIN 3.5 12/31/2014 1313   AST 22 10/06/2016 1104   AST 21 12/31/2014 1313   ALT 12 (L) 10/06/2016 1104   ALT 23 12/31/2014 1313   ALKPHOS 61 10/06/2016 1104   ALKPHOS 66 12/31/2014 1313   BILITOT 0.3 10/06/2016 1104   BILITOT 0.2 12/31/2014 1313   GFRNONAA >60 10/06/2016 1104   GFRNONAA >60 12/31/2014 1313   GFRNONAA >60 07/16/2014 1351   GFRAA >60 10/06/2016 1104   GFRAA >60 12/31/2014 1313   GFRAA >60 07/16/2014 1351    No results found for: SPEP, UPEP  Lab Results  Component Value Date   WBC 5.3 10/06/2016   NEUTROABS 2.1 10/06/2016   HGB 11.3 (L) 10/06/2016   HCT 34.0 (L) 10/06/2016   MCV 83.7 10/06/2016   PLT 221 10/06/2016      Chemistry      Component Value Date/Time   NA 139 10/06/2016 1104   NA 143 12/31/2014 1313   K 3.7 10/06/2016 1104   K 3.5 12/31/2014 1313   CL 109 10/06/2016 1104   CL 110 (H) 12/31/2014  1313   CO2 25 10/06/2016 1104   CO2 28 12/31/2014 1313   BUN 19 10/06/2016 1104   BUN 13 12/31/2014 1313   CREATININE 0.93 10/06/2016 1104   CREATININE 0.89 12/31/2014 1313      Component Value Date/Time   CALCIUM 9.3 10/06/2016 1104   CALCIUM 8.4 (L) 12/31/2014 1313   ALKPHOS 61 10/06/2016 1104   ALKPHOS 66 12/31/2014 1313   AST 22 10/06/2016 1104   AST 21 12/31/2014 1313   ALT 12 (L) 10/06/2016 1104   ALT 23 12/31/2014 1313   BILITOT 0.3 10/06/2016 1104   BILITOT 0.2 12/31/2014 1313       RADIOGRAPHIC STUDIES: I have personally reviewed the radiological images as listed and agreed with the findings in the report. No results found.   ASSESSMENT & PLAN:  Carcinoma of upper-outer quadrant of right breast in female, estrogen receptor positive ([place]) # STAGE II ER/PR/Her 2 Neu POSITIVE- on tamoxifen; clinically NED.   # Mood swings/ fatigue/ hot flashes/insomnia- recommend effexor  # myalgias/ arthritis- chrondroitin/ glucosamine/ vit D 50K/week/exercise.   # follow up in 3 months/ no labs.    No orders of the defined types were placed in this encounter.     Cammie Sickle, MD 10/06/2016 12:18 PM

## 2016-10-06 NOTE — Progress Notes (Signed)
Patient is here for follow up, she mentions some right breast pain yesterday that lasted two hours, this is the first time that she can recall that this has happened. She says when she takes in a deep breath it hurts. Would like to discuss the tamoxifen.

## 2016-10-06 NOTE — Assessment & Plan Note (Signed)
#   STAGE II ER/PR/Her 2 Neu POSITIVE- on tamoxifen; clinically NED.   # Mood swings/ fatigue/ hot flashes/insomnia- recommend effexor  # myalgias/ arthritis- chrondroitin/ glucosamine/ vit D 50K/week/exercise.   # follow up in 3 months/ no labs.

## 2016-12-30 ENCOUNTER — Other Ambulatory Visit: Payer: Self-pay

## 2016-12-30 DIAGNOSIS — Z1231 Encounter for screening mammogram for malignant neoplasm of breast: Secondary | ICD-10-CM

## 2017-01-06 ENCOUNTER — Inpatient Hospital Stay: Payer: BC Managed Care – PPO | Attending: Internal Medicine | Admitting: Internal Medicine

## 2017-01-06 DIAGNOSIS — Z79810 Long term (current) use of selective estrogen receptor modulators (SERMs): Secondary | ICD-10-CM | POA: Diagnosis not present

## 2017-01-06 DIAGNOSIS — C50411 Malignant neoplasm of upper-outer quadrant of right female breast: Secondary | ICD-10-CM | POA: Diagnosis present

## 2017-01-06 DIAGNOSIS — Z170 Estrogen receptor positive status [ER+]: Secondary | ICD-10-CM | POA: Diagnosis not present

## 2017-01-06 DIAGNOSIS — N951 Menopausal and female climacteric states: Secondary | ICD-10-CM | POA: Insufficient documentation

## 2017-01-06 NOTE — Progress Notes (Signed)
Patient here today for follow up.   

## 2017-01-06 NOTE — Assessment & Plan Note (Addendum)
#   STAGE II ER/PR/Her 2 Neu POSITIVE- on Tamoxifen; clinically NED.   # Mood swings/ fatigue/ hot flashes/insomnia- Not any better. Began Effexor and is not seeing any significantly changes. Will have her stop Tamoxifen for 4 weeks. Continue to take Effexor.   # myalgias/ arthritis- Continue taking supplements chrondroitin/ glucosamine/ vit D 50K/week/exercise.   # Follow up in 4 weeks with labs [facility], LH and Estradiol). Follow-up with MD one week later. Will consider after lab results if she is post-menopausal and if switching therapies is an option.

## 2017-01-06 NOTE — Progress Notes (Signed)
[place] OFFICE PROGRESS NOTE  Patient Care Team: [name], MD as PCP - General (Family Medicine) [name] Dear, MD (Inactive) (Family Medicine) Robert Bellow, MD (General Surgery) Forest Gleason, MD (Unknown Physician Specialty) Thornton Park, MD as Consulting Physician (Orthopedic Surgery)  Cancer Staging Carcinoma of upper-outer quadrant of right breast in female, estrogen receptor positive ([place]) Staging form: Breast, AJCC 7th Edition - Clinical: Stage IIA (T2, N0, M0) - Signed by Forest Gleason, MD on 05/09/2015    Oncology History   1. Carcinoma breast (right breast) 2 cm palpable mass Invasive mammary carcinoma.  Estrogen receptor positive.  Progesterone receptor positive.  HER-2/neu receptor amplified. 2. Started on neoadjuvant chemotherapy,  Taxotere, carboplatin and Herceptin and perjeta (March 05, 2014) 3. Patient does not have any clinically significant BRCA mutation Has been on and to [place] Mutation of unknown significance. . 4. 4 cycles of chemotherapy with TCH,perjeta previous did not June of 201 5.  Because of progressing side effect remaining 2 cycles of chemotherapy was not given but patient continued Herceptin and aperjeta  5. Status post right breast simple mastectomy in August of 2015 ypT1  ypN0 stage IB 6.patient started on tamoxifen in October of 2015 along with maintenance Herceptin therapy Patient is going for reconstructive surgery on October 21, 2014 final reconstructive surgery was done in February of 2016 [name] finished Herceptin therapy on March 11, 2015     Carcinoma of upper-outer quadrant of right breast in female, estrogen receptor positive ([place])   02/18/2014 Initial Diagnosis    Breast cancer of upper-outer quadrant of right female breast       INTERVAL HISTORY:  Sandra Nguyen 46 y.o.  female pleasant patient above history of right-sided breast cancer stage II ER/PR positive HER-2/neu positive status post mastectomy is here  for follow-up. Patient is tamoxifen.  Patient noted to have infection of her implants in August 2017- had both of them taken out.  Patient has intermittent pain in her left breast. No lumps or bumps noted. Denies any bone pain.  She admits to taking the Effexor and supplements that were prescribed but she denies any changes to her fatigue, hot flashes and mood swings. She states she is miserable and is wondering if there is anything else she can try.   REVIEW OF SYSTEMS:  A complete 10 point review of system is done which is negative except mentioned above/history of present illness.   PAST MEDICAL HISTORY :  Past Medical History:  Diagnosis Date  . Anemia   . Breast cancer [facility]) March 2015    Clinical T1c, N0, BRCA negative  . Malignant neoplasm of upper-outer quadrant of female breast [facility]) August 2015   pT1b, N0; M0. 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 Migraines     PAST SURGICAL HISTORY :   Past Surgical History:  Procedure Laterality Date  . AUGMENTATION MAMMAPLASTY Bilateral Nov 2016   Dr Tula Nakayama  . BREAST BIOPSY Right 02/2014   positive  . BREAST RECONSTRUCTION Right 04/14/2015   Procedure: AEROLA/NIPPLE RECONSTRUCTION WITH GRAFT;  Surgeon: Nicholaus Bloom, MD;  Location: [place];  Service: Plastics;  Laterality: Right;  . BREAST SURGERY Right July 10, 2014   Right simple mastectomy with sentinel node biopsy.  . COLONOSCOPY WITH PROPOFOL N/A 05/04/2016   Procedure: COLONOSCOPY WITH PROPOFOL;  Surgeon: Robert Bellow, MD;  Location: [facility] ENDOSCOPY;  Service: Endoscopy;  Laterality: N/A;  . EXCISIONAL HEMORRHOIDECTOMY  2015  . LEEP  1990's  . MASTECTOMY  Right 07/2014   chemo and herceptin tx    FAMILY HISTORY :   Family History  Problem Relation Age of Onset  . Cancer Maternal Aunt     breast cancer, age greater than 35 at diagnosis  . Cancer Maternal Aunt     cervical    SOCIAL HISTORY:   Social History  Substance Use Topics  . Smoking  status: Never Smoker  . Smokeless tobacco: Never Used  . Alcohol use No    ALLERGIES:  is allergic to keflex [cephalexin].  MEDICATIONS:  Current Outpatient Prescriptions  Medication Sig Dispense Refill  . BIOTIN 5000 PO Take 5,000 mg by mouth daily. 7 PM    . clonazePAM (KLONOPIN) 0.5 MG tablet Take 0.5 mg by mouth at bedtime as needed for anxiety.    . ergocalciferol (VITAMIN D2) 50000 units capsule Take 1 capsule (50,000 Units total) by mouth once a week. 12 capsule 1  . levocetirizine (XYZAL) 5 MG tablet Take 5 mg by mouth every evening.    . tamoxifen (NOLVADEX) 20 MG tablet Take 1 tablet (20 mg total) by mouth daily. 30 tablet 6  . venlafaxine XR (EFFEXOR-XR) 37.5 MG 24 hr capsule Take 1 capsule (37.5 mg total) by mouth daily with breakfast. 30 capsule 6   No current facility-administered medications for this visit.     PHYSICAL EXAMINATION: ECOG PERFORMANCE STATUS: 0 - Asymptomatic  BP 113/76 (BP Location: Right Arm, Patient Position: Sitting)   Pulse 66   Temp 98.1 F (36.7 C) (Tympanic)   Wt 149 lb 4 oz (67.7 kg)   BMI 26.44 kg/m   Filed Weights   01/06/17 1454  Weight: 149 lb 4 oz (67.7 kg)    GENERAL: Well-nourished well-developed; Alert, no distress and comfortable.   Alone.  EYES: no pallor or icterus OROPHARYNX: no thrush or ulceration; good dentition  NECK: supple, no masses felt LYMPH:  no palpable lymphadenopathy in the cervical, axillary or inguinal regions LUNGS: clear to auscultation and  No wheeze or crackles HEART/CVS: regular rate & rhythm and no murmurs; No lower extremity edema ABDOMEN:abdomen soft, non-tender and normal bowel sounds Musculoskeletal:no cyanosis of digits and no clubbing  PSYCH: alert & oriented x 3 with fluent speech NEURO: no focal motor/sensory deficits SKIN:  no rashes or significant lesions  Right and left BREAST exam [in the presence of nurse]- right- mastectomy- no signs of infection; Left breast- no unusual skin changes  or dominant masses felt.    LABORATORY DATA:  I have reviewed the data as listed    Component Value Date/Time   NA 139 10/06/2016 1104   NA 143 12/31/2014 1313   K 3.7 10/06/2016 1104   K 3.5 12/31/2014 1313   CL 109 10/06/2016 1104   CL 110 (H) 12/31/2014 1313   CO2 25 10/06/2016 1104   CO2 28 12/31/2014 1313   GLUCOSE 110 (H) 10/06/2016 1104   GLUCOSE 124 (H) 12/31/2014 1313   BUN 19 10/06/2016 1104   BUN 13 12/31/2014 1313   CREATININE 0.93 10/06/2016 1104   CREATININE 0.89 12/31/2014 1313   CALCIUM 9.3 10/06/2016 1104   CALCIUM 8.4 (L) 12/31/2014 1313   PROT 7.0 10/06/2016 1104   PROT 6.9 12/31/2014 1313   ALBUMIN 3.9 10/06/2016 1104   ALBUMIN 3.5 12/31/2014 1313   AST 22 10/06/2016 1104   AST 21 12/31/2014 1313   ALT 12 (L) 10/06/2016 1104   ALT 23 12/31/2014 1313   ALKPHOS 61 10/06/2016 1104   ALKPHOS  66 12/31/2014 1313   BILITOT 0.3 10/06/2016 1104   BILITOT 0.2 12/31/2014 1313   GFRNONAA >60 10/06/2016 1104   GFRNONAA >60 12/31/2014 1313   GFRNONAA >60 07/16/2014 1351   GFRAA >60 10/06/2016 1104   GFRAA >60 12/31/2014 1313   GFRAA >60 07/16/2014 1351    No results found for: SPEP, UPEP  Lab Results  Component Value Date   WBC 5.3 10/06/2016   NEUTROABS 2.1 10/06/2016   HGB 11.3 (L) 10/06/2016   HCT 34.0 (L) 10/06/2016   MCV 83.7 10/06/2016   PLT 221 10/06/2016      Chemistry      Component Value Date/Time   NA 139 10/06/2016 1104   NA 143 12/31/2014 1313   K 3.7 10/06/2016 1104   K 3.5 12/31/2014 1313   CL 109 10/06/2016 1104   CL 110 (H) 12/31/2014 1313   CO2 25 10/06/2016 1104   CO2 28 12/31/2014 1313   BUN 19 10/06/2016 1104   BUN 13 12/31/2014 1313   CREATININE 0.93 10/06/2016 1104   CREATININE 0.89 12/31/2014 1313      Component Value Date/Time   CALCIUM 9.3 10/06/2016 1104   CALCIUM 8.4 (L) 12/31/2014 1313   ALKPHOS 61 10/06/2016 1104   ALKPHOS 66 12/31/2014 1313   AST 22 10/06/2016 1104   AST 21 12/31/2014 1313   ALT 12 (L)  10/06/2016 1104   ALT 23 12/31/2014 1313   BILITOT 0.3 10/06/2016 1104   BILITOT 0.2 12/31/2014 1313       RADIOGRAPHIC STUDIES: I have personally reviewed the radiological images as listed and agreed with the findings in the report. No results found.   ASSESSMENT & PLAN:  Carcinoma of upper-outer quadrant of right breast in female, estrogen receptor positive ([place]) # STAGE II ER/PR/Her 2 Neu POSITIVE- on Tamoxifen; clinically NED.   # Mood swings/ fatigue/ hot flashes/insomnia- Not any better. Began Effexor and is not seeing any significantly changes. Will have her stop Tamoxifen for 4 weeks. Continue to take Effexor.   # myalgias/ arthritis- Continue taking supplements chrondroitin/ glucosamine/ vit D 50K/week/exercise.   # Follow up in 4 weeks with labs [facility], LH and Estradiol). Follow-up with MD one week later. Will consider after lab results if she is post-menopausal and if switching therapies is an option.    Orders Placed This Encounter  Procedures  . FSH/LH    Standing Status:   Future    Standing Expiration Date:   01/06/2018  . Estradiol    Standing Status:   Future    Standing Expiration Date:   01/06/2018   Faythe Casa, NP    Cammie Sickle, MD 01/08/2017 12:26 PM

## 2017-02-03 ENCOUNTER — Other Ambulatory Visit: Payer: Self-pay | Admitting: Oncology

## 2017-02-06 ENCOUNTER — Other Ambulatory Visit: Payer: Self-pay | Admitting: General Surgery

## 2017-02-06 ENCOUNTER — Ambulatory Visit
Admission: RE | Admit: 2017-02-06 | Discharge: 2017-02-06 | Disposition: A | Discharge status: Home / Self Care | Payer: BC Managed Care – PPO | Source: Ambulatory Visit | Attending: General Surgery | Admitting: General Surgery

## 2017-02-06 DIAGNOSIS — Z1231 Encounter for screening mammogram for malignant neoplasm of breast: Secondary | ICD-10-CM

## 2017-02-06 DIAGNOSIS — Z9011 Acquired absence of right breast and nipple: Secondary | ICD-10-CM | POA: Insufficient documentation

## 2017-02-10 ENCOUNTER — Inpatient Hospital Stay: Payer: BC Managed Care – PPO | Attending: Internal Medicine

## 2017-02-10 DIAGNOSIS — C50411 Malignant neoplasm of upper-outer quadrant of right female breast: Secondary | ICD-10-CM | POA: Diagnosis present

## 2017-02-10 DIAGNOSIS — Z9011 Acquired absence of right breast and nipple: Secondary | ICD-10-CM | POA: Diagnosis not present

## 2017-02-10 DIAGNOSIS — Z9221 Personal history of antineoplastic chemotherapy: Secondary | ICD-10-CM | POA: Diagnosis not present

## 2017-02-10 DIAGNOSIS — Z79811 Long term (current) use of aromatase inhibitors: Secondary | ICD-10-CM | POA: Insufficient documentation

## 2017-02-10 DIAGNOSIS — Z170 Estrogen receptor positive status [ER+]: Secondary | ICD-10-CM | POA: Diagnosis not present

## 2017-02-11 LAB — FSH/LH
FSH: 8.4 m[IU]/mL
LH: 12.8 m[IU]/mL

## 2017-02-11 LAB — ESTRADIOL: Estradiol: 233.2 pg/mL

## 2017-02-13 ENCOUNTER — Encounter: Payer: Self-pay | Admitting: General Surgery

## 2017-02-13 ENCOUNTER — Ambulatory Visit (INDEPENDENT_AMBULATORY_CARE_PROVIDER_SITE_OTHER): Payer: BC Managed Care – PPO | Admitting: General Surgery

## 2017-02-13 VITALS — BP 120/74 | HR 80 | Resp 12 | Ht 60.0 in | Wt 143.0 lb

## 2017-02-13 DIAGNOSIS — Z170 Estrogen receptor positive status [ER+]: Secondary | ICD-10-CM

## 2017-02-13 DIAGNOSIS — C50411 Malignant neoplasm of upper-outer quadrant of right female breast: Secondary | ICD-10-CM

## 2017-02-13 NOTE — Progress Notes (Addendum)
Patient ID: Sandra Nguyen, female   DOB: 1971-02-21, 46 y.o.   MRN: [id number]    HPI Sandra Nguyen is a 46 y.o. female who presents for a breast evaluation. The most recent left  mammogram was done on 02/06/2017 .  Patient does perform regular self breast checks and gets regular mammograms done. Patient had her implants removed on 07/07/2016.  Patient stop her Tamoxifen last month due to side effects including weight gain, hot flashes. This was done in conjunction with her visit with Dr. Rogue Bussing.   Since the patient's last visit she was hospitalized in August 2017 with a recurrent infection involving the reconstructed right breast. She subsequently had her implants removed. She is still experiencing soreness in both the mastectomy site and in the contralateral breast. Most of her discomfort is with direct pressure rather than activity.   HPI  Past Medical History:  Diagnosis Date  . Anemia   . Breast cancer [facility]) March 2015    Clinical T1c, N0, BRCA negative  . Malignant neoplasm of upper-outer quadrant of female breast [facility]) August 2015   pT1b, N0; M0. 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 Migraines     Past Surgical History:  Procedure Laterality Date  . AUGMENTATION MAMMAPLASTY Bilateral Nov 2016   Dr Tula Nakayama REMOVED 07/2016  . BREAST BIOPSY Right 02/2014   positive  . BREAST RECONSTRUCTION Right 04/14/2015   Procedure: AEROLA/NIPPLE RECONSTRUCTION WITH GRAFT;  Surgeon: Nicholaus Bloom, MD;  Location: [place];  Service: Plastics;  Laterality: Right;  . BREAST SURGERY Right July 10, 2014   Right simple mastectomy with sentinel node biopsy.  . COLONOSCOPY WITH PROPOFOL N/A 05/04/2016   Procedure: COLONOSCOPY WITH PROPOFOL;  Surgeon: Robert Bellow, MD;  Location: [facility] ENDOSCOPY;  Service: Endoscopy;  Laterality: N/A;  . EXCISIONAL HEMORRHOIDECTOMY  2015  . LEEP  1990's  . MASTECTOMY Right 07/2014   chemo and herceptin tx    Family History  Problem  Relation Age of Onset  . Cancer Maternal Aunt     breast cancer, age greater than 40 at diagnosis  . Cancer Maternal Aunt     cervical    Social History Social History  Substance Use Topics  . Smoking status: Never Smoker  . Smokeless tobacco: Never Used  . Alcohol use No    Allergies  Allergen Reactions  . Keflex [Cephalexin] Rash    Current Outpatient Prescriptions  Medication Sig Dispense Refill  . BIOTIN 5000 PO Take 5,000 mg by mouth daily. 7 PM    . clonazePAM (KLONOPIN) 0.5 MG tablet Take 0.5 mg by mouth at bedtime as needed for anxiety.    . ergocalciferol (VITAMIN D2) 50000 units capsule Take 1 capsule (50,000 Units total) by mouth once a week. 12 capsule 1  . levocetirizine (XYZAL) 5 MG tablet Take 5 mg by mouth every evening.     No current facility-administered medications for this visit.     Review of Systems Review of Systems  Constitutional: Negative.   Respiratory: Negative.   Cardiovascular: Negative.     Blood pressure 120/74, pulse 80, resp. rate 12, height 5' (1.524 m), weight 143 lb (64.9 kg).  Physical Exam Physical Exam  Constitutional: She is oriented to person, place, and time. She appears well-developed and well-nourished.  Eyes: Conjunctivae are normal. No scleral icterus.  Neck: Neck supple.  Cardiovascular: Normal rate, regular rhythm and normal heart sounds.   Pulmonary/Chest: Effort normal and breath sounds normal. Left breast exhibits no inverted nipple,  no mass, no nipple discharge, no skin change and no tenderness.    Right mastectomy site is clean and well healed.   Abdominal: Soft. Bowel sounds are normal. There is no tenderness.  Lymphadenopathy:    She has no cervical adenopathy.  Neurological: She is alert and oriented to person, place, and time.  Skin: Skin is warm and dry.    Data Reviewed FSH, estradiol and LH values are premenopausal.  Assessment    No evidence of recurrent malignancy.      Plan    Poor  cosmetic outcome with immediate reconstruction and contralateral augmentation.  The patient brought up the possibility of having her ovaries removed. This would likely reproduce all the symptoms she is experiencing and present with tamoxifen. The only potential benefit would be avoidance of the 1% risk of DVT, assuming this did not occur intraoperatively. This seems like a aggressive move, and she should discuss this in further detail with medical oncology for their recommendations.  At this time, with her feeling well off of her antiestrogen therapy, observation may be her best option.     The patient has been asked to return to the office in one year with a left screening mammogram.  This information has been scribed by Gaspar Cola CMA.   Robert Bellow 02/15/2017, 9:38 AM

## 2017-02-16 ENCOUNTER — Inpatient Hospital Stay (HOSPITAL_BASED_OUTPATIENT_CLINIC_OR_DEPARTMENT_OTHER): Payer: BC Managed Care – PPO | Admitting: Internal Medicine

## 2017-02-16 VITALS — BP 114/73 | HR 101 | Temp 98.2°F | Resp 18 | Wt 139.4 lb

## 2017-02-16 DIAGNOSIS — C50411 Malignant neoplasm of upper-outer quadrant of right female breast: Secondary | ICD-10-CM | POA: Diagnosis not present

## 2017-02-16 DIAGNOSIS — Z9221 Personal history of antineoplastic chemotherapy: Secondary | ICD-10-CM | POA: Diagnosis not present

## 2017-02-16 DIAGNOSIS — Z9011 Acquired absence of right breast and nipple: Secondary | ICD-10-CM

## 2017-02-16 DIAGNOSIS — Z79811 Long term (current) use of aromatase inhibitors: Secondary | ICD-10-CM

## 2017-02-16 DIAGNOSIS — Z170 Estrogen receptor positive status [ER+]: Secondary | ICD-10-CM | POA: Diagnosis not present

## 2017-02-16 NOTE — Progress Notes (Signed)
Patient here today for follow up.  Patient states no new concerns today  

## 2017-02-16 NOTE — Assessment & Plan Note (Addendum)
#   STAGE II ER/PR/; tolerating chemo; Her 2 Neu POSITIVE. Clinically NED. Recent mammogram March 2018 NED. Continue tamoxifen- tolerating with moderate side effects [see discussion below]. Patient's hormones suggestive of premenopausal state.   # Mood swings/ fatigue/ hot flashes/insomnia-Improved off tamoxifen; Continue to take Effexor 3 pills/day. Discussed regarding inducing postmenopausal state- using [facility] agonist vs oophorectomy [which likely would be too aggressive]. Unfortunately this might make her current symptoms worse.   # myalgias/ arthritis- Continue taking supplements chrondroitin/ glucosamine/ vit D 50K/week/exercise.   # follow up in 4 months; No labs.

## 2017-02-16 NOTE — Progress Notes (Signed)
[place] OFFICE PROGRESS NOTE  Patient Care Team: [name], MD as PCP - General (Family Medicine) [name] Dear, MD (Inactive) (Family Medicine) Robert Bellow, MD (General Surgery) Forest Gleason, MD (Unknown Physician Specialty) Thornton Park, MD as Consulting Physician (Orthopedic Surgery)  Cancer Staging Carcinoma of upper-outer quadrant of right breast in female, estrogen receptor positive ([place]) Staging form: Breast, AJCC 7th Edition - Clinical: Stage IIA (T2, N0, M0) - Signed by Forest Gleason, MD on 05/09/2015    Oncology History   1. Carcinoma breast (right breast) 2 cm palpable mass Invasive mammary carcinoma.  Estrogen receptor positive.  Progesterone receptor positive.  HER-2/neu receptor amplified. 2. Started on neoadjuvant chemotherapy,  Taxotere, carboplatin and Herceptin and perjeta (March 05, 2014) 3. Patient does not have any clinically significant BRCA mutation Has been on and to [place] Mutation of unknown significance. . 4. 4 cycles of chemotherapy with TCH,perjeta previous did not June of 201 5.  Because of progressing side effect remaining 2 cycles of chemotherapy was not given but patient continued Herceptin and aperjeta  5. Status post right breast simple mastectomy in August of 2015 ypT1  ypN0 stage IB 6.patient started on tamoxifen in October of 2015 along with maintenance Herceptin therapy Patient is going for reconstructive surgery on October 21, 2014 final reconstructive surgery was done in February of 2016 [name] finished Herceptin therapy on March 11, 2015     Carcinoma of upper-outer quadrant of right breast in female, estrogen receptor positive ([place])   02/18/2014 Initial Diagnosis    Breast cancer of upper-outer quadrant of right female breast        INTERVAL HISTORY:  Pattie L Cobb 46 y.o.  female pleasant patient above history of right-sided breast cancer stage II ER/PR positive HER-2/neu positive status post mastectomy is  here for follow-up. Patient is tamoxifen.  Patient's tamoxifen was held approximately for 4 weeks because of worsening mood swings hot flashes and also joint pains. Symptoms are improved' however not completely resolves. Especially the left knee pain.   No lumps or bumps noted. Denies any bone pain. In the interim she has been evaluated by Dr. Charissa Bash. Mammogram negative.   REVIEW OF SYSTEMS:  A complete 10 point review of system is done which is negative except mentioned above/history of present illness.   PAST MEDICAL HISTORY :  Past Medical History:  Diagnosis Date  . Anemia   . Breast cancer [facility]) March 2015    Clinical T1c, N0, BRCA negative  . Malignant neoplasm of upper-outer quadrant of female breast [facility]) August 2015   pT1b, N0; M0. 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 Migraines     PAST SURGICAL HISTORY :   Past Surgical History:  Procedure Laterality Date  . AUGMENTATION MAMMAPLASTY Bilateral Nov 2016   Dr Tula Nakayama REMOVED 07/2016  . BREAST BIOPSY Right 02/2014   positive  . BREAST RECONSTRUCTION Right 04/14/2015   Procedure: AEROLA/NIPPLE RECONSTRUCTION WITH GRAFT;  Surgeon: Nicholaus Bloom, MD;  Location: [place];  Service: Plastics;  Laterality: Right;  . BREAST SURGERY Right July 10, 2014   Right simple mastectomy with sentinel node biopsy.  . COLONOSCOPY WITH PROPOFOL N/A 05/04/2016   Procedure: COLONOSCOPY WITH PROPOFOL;  Surgeon: Robert Bellow, MD;  Location: [facility] ENDOSCOPY;  Service: Endoscopy;  Laterality: N/A;  . EXCISIONAL HEMORRHOIDECTOMY  2015  . LEEP  1990's  . MASTECTOMY Right 07/2014   chemo and herceptin tx    FAMILY HISTORY :   Family History  Problem Relation Age of Onset  . Cancer Maternal Aunt     breast cancer, age greater than 82 at diagnosis  . Cancer Maternal Aunt     cervical    SOCIAL HISTORY:   Social History  Substance Use Topics  . Smoking status: Never Smoker  . Smokeless tobacco: Never Used  . Alcohol  use No    ALLERGIES:  is allergic to keflex [cephalexin].  MEDICATIONS:  Current Outpatient Prescriptions  Medication Sig Dispense Refill  . BIOTIN 5000 PO Take 5,000 mg by mouth daily. 7 PM    . clonazePAM (KLONOPIN) 0.5 MG tablet Take 0.5 mg by mouth at bedtime as needed for anxiety.    . ergocalciferol (VITAMIN D2) 50000 units capsule Take 1 capsule (50,000 Units total) by mouth once a week. 12 capsule 1  . levocetirizine (XYZAL) 5 MG tablet Take 5 mg by mouth every evening.    Marland Kitchen PROAIR HFA 108 (90 Base) MCG/ACT inhaler 1-2 INHALATIONS EVERY 4-6 HOURS AS NEEDED FOR WHEEZING. DISPENSE SPACER AS NEEDED.  0  . venlafaxine XR (EFFEXOR-XR) 75 MG 24 hr capsule Take 150 mg by mouth daily with breakfast.     No current facility-administered medications for this visit.     PHYSICAL EXAMINATION: ECOG PERFORMANCE STATUS: 0 - Asymptomatic  BP 114/73 (BP Location: Left Arm, Patient Position: Sitting)   Pulse (!) 101   Temp 98.2 F (36.8 C) (Tympanic)   Resp 18   Wt 139 lb 6 oz (63.2 kg)   BMI 27.22 kg/m   Filed Weights   02/16/17 0909  Weight: 139 lb 6 oz (63.2 kg)    GENERAL: Well-nourished well-developed; Alert, no distress and comfortable.   Alone.  EYES: no pallor or icterus OROPHARYNX: no thrush or ulceration; good dentition  NECK: supple, no masses felt LYMPH:  no palpable lymphadenopathy in the cervical, axillary or inguinal regions LUNGS: clear to auscultation and  No wheeze or crackles HEART/CVS: regular rate & rhythm and no murmurs; No lower extremity edema ABDOMEN:abdomen soft, non-tender and normal bowel sounds Musculoskeletal:no cyanosis of digits and no clubbing  PSYCH: alert & oriented x 3 with fluent speech NEURO: no focal motor/sensory deficits SKIN:  no rashes or significant lesions    LABORATORY DATA:  I have reviewed the data as listed    Component Value Date/Time   NA 139 10/06/2016 1104   NA 143 12/31/2014 1313   K 3.7 10/06/2016 1104   K 3.5  12/31/2014 1313   CL 109 10/06/2016 1104   CL 110 (H) 12/31/2014 1313   CO2 25 10/06/2016 1104   CO2 28 12/31/2014 1313   GLUCOSE 110 (H) 10/06/2016 1104   GLUCOSE 124 (H) 12/31/2014 1313   BUN 19 10/06/2016 1104   BUN 13 12/31/2014 1313   CREATININE 0.93 10/06/2016 1104   CREATININE 0.89 12/31/2014 1313   CALCIUM 9.3 10/06/2016 1104   CALCIUM 8.4 (L) 12/31/2014 1313   PROT 7.0 10/06/2016 1104   PROT 6.9 12/31/2014 1313   ALBUMIN 3.9 10/06/2016 1104   ALBUMIN 3.5 12/31/2014 1313   AST 22 10/06/2016 1104   AST 21 12/31/2014 1313   ALT 12 (L) 10/06/2016 1104   ALT 23 12/31/2014 1313   ALKPHOS 61 10/06/2016 1104   ALKPHOS 66 12/31/2014 1313   BILITOT 0.3 10/06/2016 1104   BILITOT 0.2 12/31/2014 1313   GFRNONAA >60 10/06/2016 1104   GFRNONAA >60 12/31/2014 1313   GFRNONAA >60 07/16/2014 1351   GFRAA >60 10/06/2016 1104  GFRAA >60 12/31/2014 1313   GFRAA >60 07/16/2014 1351    No results found for: SPEP, UPEP  Lab Results  Component Value Date   WBC 5.3 10/06/2016   NEUTROABS 2.1 10/06/2016   HGB 11.3 (L) 10/06/2016   HCT 34.0 (L) 10/06/2016   MCV 83.7 10/06/2016   PLT 221 10/06/2016      Chemistry      Component Value Date/Time   NA 139 10/06/2016 1104   NA 143 12/31/2014 1313   K 3.7 10/06/2016 1104   K 3.5 12/31/2014 1313   CL 109 10/06/2016 1104   CL 110 (H) 12/31/2014 1313   CO2 25 10/06/2016 1104   CO2 28 12/31/2014 1313   BUN 19 10/06/2016 1104   BUN 13 12/31/2014 1313   CREATININE 0.93 10/06/2016 1104   CREATININE 0.89 12/31/2014 1313      Component Value Date/Time   CALCIUM 9.3 10/06/2016 1104   CALCIUM 8.4 (L) 12/31/2014 1313   ALKPHOS 61 10/06/2016 1104   ALKPHOS 66 12/31/2014 1313   AST 22 10/06/2016 1104   AST 21 12/31/2014 1313   ALT 12 (L) 10/06/2016 1104   ALT 23 12/31/2014 1313   BILITOT 0.3 10/06/2016 1104   BILITOT 0.2 12/31/2014 1313       RADIOGRAPHIC STUDIES: I have personally reviewed the radiological images as listed and  agreed with the findings in the report. No results found.   ASSESSMENT & PLAN:  Carcinoma of upper-outer quadrant of right breast in female, estrogen receptor positive ([place]) # STAGE II ER/PR/; tolerating chemo; Her 2 Neu POSITIVE. Clinically NED. Recent mammogram March 2018 NED. Continue tamoxifen- tolerating with moderate side effects [see discussion below]. Patient's hormones suggestive of premenopausal state.   # Mood swings/ fatigue/ hot flashes/insomnia-Improved off tamoxifen; Continue to take Effexor 3 pills/day. Discussed regarding inducing postmenopausal state- using [facility] agonist vs oophorectomy [which likely would be too aggressive]. Unfortunately this might make her current symptoms worse.   # myalgias/ arthritis- Continue taking supplements chrondroitin/ glucosamine/ vit D 50K/week/exercise.   # follow up in 4 months; No labs.    No orders of the defined types were placed in this encounter.     Cammie Sickle, MD 02/16/2017 12:16 PM

## 2017-02-17 ENCOUNTER — Inpatient Hospital Stay: Payer: BC Managed Care – PPO | Admitting: Hematology and Oncology

## 2017-03-18 ENCOUNTER — Other Ambulatory Visit: Payer: Self-pay | Admitting: Internal Medicine

## 2017-03-24 ENCOUNTER — Other Ambulatory Visit: Payer: Self-pay | Admitting: *Deleted

## 2017-03-24 MED ORDER — TAMOXIFEN CITRATE 20 MG PO TABS: 20.0000 mg | ORAL_TABLET | Freq: Every day | ORAL | 1 refills | Status: DC

## 2017-03-27 ENCOUNTER — Other Ambulatory Visit: Payer: Self-pay | Admitting: *Deleted

## 2017-03-27 NOTE — Telephone Encounter (Signed)
Already filled

## 2017-04-20 ENCOUNTER — Encounter: Payer: Self-pay | Admitting: General Surgery

## 2017-04-20 ENCOUNTER — Other Ambulatory Visit (HOSPITAL_COMMUNITY)
Admission: RE | Admit: 2017-04-20 | Discharge: 2017-04-20 | Disposition: A | Discharge status: Home / Self Care | Payer: BC Managed Care – PPO | Source: Ambulatory Visit | Attending: General Surgery | Admitting: General Surgery

## 2017-04-20 ENCOUNTER — Ambulatory Visit (INDEPENDENT_AMBULATORY_CARE_PROVIDER_SITE_OTHER): Payer: BC Managed Care – PPO | Admitting: General Surgery

## 2017-04-20 ENCOUNTER — Inpatient Hospital Stay: Payer: Self-pay

## 2017-04-20 VITALS — BP 112/74 | HR 86 | Resp 12 | Ht 63.0 in | Wt 135.0 lb

## 2017-04-20 DIAGNOSIS — Z170 Estrogen receptor positive status [ER+]: Secondary | ICD-10-CM | POA: Diagnosis not present

## 2017-04-20 DIAGNOSIS — C50411 Malignant neoplasm of upper-outer quadrant of right female breast: Secondary | ICD-10-CM | POA: Diagnosis present

## 2017-04-20 DIAGNOSIS — R222 Localized swelling, mass and lump, trunk: Secondary | ICD-10-CM

## 2017-04-20 NOTE — Progress Notes (Signed)
Patient ID: Sandra Nguyen, female   DOB: 1971/03/23, 46 y.o.   MRN: [id number]  Chief Complaint  Patient presents with  . Breast Problem    HPI Sandra Nguyen is a 46 y.o. female.  She is here today for evaluation of a right armpit mass that she found because she had some discomfort in that area. She states it as about the size of a pea. She noticed this about 1 week ago. It does seem to be causing some pain and has not changed in size. Mammogram was 02-06-17. She does admit to a cough and headaches with the weather changing.  She is here with her husband, Tennis Ship.  HPI  Past Medical History:  Diagnosis Date  . Anemia   . Breast cancer [facility]) March 2015    Clinical T1c, N0, BRCA negative  . Malignant neoplasm of upper-outer quadrant of female breast [facility]) August 2015   pT1b, N0; M0. 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 Migraines     Past Surgical History:  Procedure Laterality Date  . AUGMENTATION MAMMAPLASTY Bilateral Nov 2016   Dr Tula Nakayama REMOVED 07/2016  . BREAST BIOPSY Right 02/2014   positive  . BREAST RECONSTRUCTION Right 04/14/2015   Procedure: AEROLA/NIPPLE RECONSTRUCTION WITH GRAFT;  Surgeon: Nicholaus Bloom, MD;  Location: [place];  Service: Plastics;  Laterality: Right;  . BREAST SURGERY Right July 10, 2014   Right simple mastectomy with sentinel node biopsy.  . COLONOSCOPY WITH PROPOFOL N/A 05/04/2016   Procedure: COLONOSCOPY WITH PROPOFOL;  Surgeon: Robert Bellow, MD;  Location: [facility] ENDOSCOPY;  Service: Endoscopy;  Laterality: N/A;  . EXCISIONAL HEMORRHOIDECTOMY  2015  . LEEP  1990's  . MASTECTOMY Right 07/2014   chemo and herceptin tx    Family History  Problem Relation Age of Onset  . Cancer Maternal Aunt        breast cancer, age greater than 73 at diagnosis  . Cancer Maternal Aunt        cervical    Social History Social History  Substance Use Topics  . Smoking status: Never Smoker  . Smokeless tobacco: Never Used  .  Alcohol use No    Allergies  Allergen Reactions  . Keflex [Cephalexin] Rash    Current Outpatient Prescriptions  Medication Sig Dispense Refill  . BIOTIN 5000 PO Take 5,000 mg by mouth daily. 7 PM    . clonazePAM (KLONOPIN) 0.5 MG tablet Take 0.5 mg by mouth at bedtime as needed for anxiety.    Marland Kitchen levocetirizine (XYZAL) 5 MG tablet Take 5 mg by mouth every evening.    Marland Kitchen PROAIR HFA 108 (90 Base) MCG/ACT inhaler 1-2 INHALATIONS EVERY 4-6 HOURS AS NEEDED FOR WHEEZING. DISPENSE SPACER AS NEEDED.  0  . tamoxifen (NOLVADEX) 20 MG tablet Take 1 tablet (20 mg total) by mouth daily. 30 tablet 1  . venlafaxine XR (EFFEXOR-XR) 75 MG 24 hr capsule Take 150 mg by mouth daily with breakfast.    . Vitamin D, Ergocalciferol, (DRISDOL) 50000 units CAPS capsule TAKE 1 CAPSULE (50,000 UNITS TOTAL) BY MOUTH ONCE A WEEK. 12 capsule 1   No current facility-administered medications for this visit.     Review of Systems Review of Systems  Constitutional: Negative.   Respiratory: Positive for cough.   Cardiovascular: Negative.   Neurological: Positive for headaches.    Blood pressure 112/74, pulse 86, resp. rate 12, height 5' 3" (1.6 m), weight 135 lb (61.2 kg).  Physical Exam Physical Exam  Constitutional: She  is oriented to person, place, and time. She appears well-developed and well-nourished.  Cardiovascular: Normal rate and regular rhythm.   Pulmonary/Chest: Effort normal and breath sounds normal.    Neurological: She is alert and oriented to person, place, and time.  Skin: Skin is warm and dry.  Psychiatric: Her behavior is normal.    Data Reviewed Ultrasound examination of the area of palpable thickening and tenderness was completed. A complex lesion with a hyperechoic core suggestive of an inflammatory process was noted in the upper aspect of the mastectomy site near the axillary tail, base of the axilla. This measured up to 0.68 cm in diameter. An associated fluid-filled area was noted.  The area was felt be appropriate for aspiration based on the patient's concerns and the focal tenderness. This was completed using 1 mL of 1% plain Xylocaine. A lateral approach was undertaken and thick, viscous white fluid was aspirated consistent with fat necrosis. This was prepared on slides 2 for cytologic review. (Is reported negative for malignant cells) sees.  The patient tolerated the procedure well.  Assessment    Focal fat necrosis secondary to multiple surgical procedures.    Plan    The patient will resume her annual follow-up exam in March 2019, earlier if new physical findings are appreciated.    Her husband is concerned about her lack of exercise. We'll long discussion about activity, strength training and weighs to improve endurance.   HPI, Physical Exam, Assessment and Plan have been scribed under the direction and in the presence of Robert Bellow, MD.  Karie Fetch, RN  I have completed the exam and reviewed the above documentation for accuracy and completeness.  I agree with the above.  Haematologist has been used and any errors in dictation or transcription are unintentional.  Hervey Ard, M.D., F.A.C.S.   Robert Bellow 04/22/2017, 8:16 AM

## 2017-04-20 NOTE — Patient Instructions (Signed)
The patient is aware to call back for any questions or concerns.  

## 2017-04-22 DIAGNOSIS — R222 Localized swelling, mass and lump, trunk: Secondary | ICD-10-CM | POA: Insufficient documentation

## 2017-05-05 HISTORY — PX: OTHER SURGICAL HISTORY: SHX169

## 2017-05-18 ENCOUNTER — Other Ambulatory Visit: Payer: Self-pay | Admitting: *Deleted

## 2017-05-18 MED ORDER — TAMOXIFEN CITRATE 20 MG PO TABS: 20.0000 mg | ORAL_TABLET | Freq: Every day | ORAL | 0 refills | Status: DC

## 2017-05-29 ENCOUNTER — Telehealth: Payer: Self-pay

## 2017-05-29 ENCOUNTER — Ambulatory Visit (INDEPENDENT_AMBULATORY_CARE_PROVIDER_SITE_OTHER): Payer: BC Managed Care – PPO | Admitting: General Surgery

## 2017-05-29 ENCOUNTER — Encounter: Payer: Self-pay | Admitting: General Surgery

## 2017-05-29 VITALS — BP 120/82 | HR 78 | Resp 12 | Ht 63.0 in | Wt 130.0 lb

## 2017-05-29 DIAGNOSIS — K645 Perianal venous thrombosis: Secondary | ICD-10-CM

## 2017-05-29 NOTE — Telephone Encounter (Signed)
Patient called and states that she has had stomach pain and rectal bleeding since yesterday. She woke up today and had blood in her underwear. She reports no bowel movement for the past several days but did have one that was normal in size this morning. No change in bleeding with bowel movement. She does have a history of hemorrhoids. Spoke with Dr Jamal Collin and she will be seen in office today.

## 2017-05-29 NOTE — Progress Notes (Signed)
Patient ID: Sandra Nguyen, female   DOB: 1971-09-02, 46 y.o.   MRN: [id number]  Chief Complaint  Patient presents with  . Rectal Pain  . Rectal Bleeding    HPI Sandra Nguyen is a 46 y.o. female. She has had mild stomach pain for 2-3 days. She had no BM for 3 days but did have one this am.  .  She woke up today and had blood in her underwear.   No change in bleeding with bowel movement. She does have a history of hemorrhoids.  3 yrs ago she had similar complaints and had hemorrhoidectomy at that time. HPI  Past Medical History:  Diagnosis Date  . Anemia   . Breast cancer [facility]) March 2015    Clinical T1c, N0, BRCA negative  . Malignant neoplasm of upper-outer quadrant of female breast [facility]) August 2015   pT1b, N0; M0. 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 Migraines     Past Surgical History:  Procedure Laterality Date  . AUGMENTATION MAMMAPLASTY Bilateral Nov 2016   Dr Tula Nakayama REMOVED 07/2016  . BREAST BIOPSY Right 02/2014   positive  . BREAST RECONSTRUCTION Right 04/14/2015   Procedure: AEROLA/NIPPLE RECONSTRUCTION WITH GRAFT;  Surgeon: Nicholaus Bloom, MD;  Location: [place];  Service: Plastics;  Laterality: Right;  . BREAST SURGERY Right July 10, 2014   Right simple mastectomy with sentinel node biopsy.  . COLONOSCOPY WITH PROPOFOL N/A 05/04/2016   Procedure: COLONOSCOPY WITH PROPOFOL;  Surgeon: Robert Bellow, MD;  Location: [facility] ENDOSCOPY;  Service: Endoscopy;  Laterality: N/A;  . EXCISIONAL HEMORRHOIDECTOMY  2015  . LEEP  1990's  . MASTECTOMY Right 07/2014   chemo and herceptin tx    Family History  Problem Relation Age of Onset  . Cancer Maternal Aunt        breast cancer, age greater than 100 at diagnosis  . Cancer Maternal Aunt        cervical    Social History Social History  Substance Use Topics  . Smoking status: Never Smoker  . Smokeless tobacco: Never Used  . Alcohol use No    Allergies  Allergen Reactions  . Keflex  [Cephalexin] Rash    Current Outpatient Prescriptions  Medication Sig Dispense Refill  . BIOTIN 5000 PO Take 5,000 mg by mouth daily. 7 PM    . clonazePAM (KLONOPIN) 0.5 MG tablet Take 0.5 mg by mouth at bedtime as needed for anxiety.    Marland Kitchen levocetirizine (XYZAL) 5 MG tablet Take 5 mg by mouth every evening.    Marland Kitchen PROAIR HFA 108 (90 Base) MCG/ACT inhaler 1-2 INHALATIONS EVERY 4-6 HOURS AS NEEDED FOR WHEEZING. DISPENSE SPACER AS NEEDED.  0  . tamoxifen (NOLVADEX) 20 MG tablet Take 1 tablet (20 mg total) by mouth daily. 30 tablet 0  . venlafaxine XR (EFFEXOR-XR) 75 MG 24 hr capsule Take 150 mg by mouth daily with breakfast.    . Vitamin D, Ergocalciferol, (DRISDOL) 50000 units CAPS capsule TAKE 1 CAPSULE (50,000 UNITS TOTAL) BY MOUTH ONCE A WEEK. 12 capsule 1   No current facility-administered medications for this visit.     Review of Systems Review of Systems  Constitutional: Negative.   Respiratory: Negative.   Cardiovascular: Negative.   Gastrointestinal: Positive for abdominal pain, anal bleeding and rectal pain.    Blood pressure 120/82, pulse 78, resp. rate 12, height _0  (1.6 m), weight 130 lb (59 kg).  Physical Exam Physical Exam  Constitutional: She is oriented to person, place, and  time. She appears well-developed and well-nourished.  Eyes: Conjunctivae are normal. No scleral icterus.  Neck: Neck supple.  Cardiovascular: Normal rate, regular rhythm and normal heart sounds.   Pulmonary/Chest: Effort normal.  Abdominal: Soft. Normal appearance and bowel sounds are normal. There is no hepatomegaly. There is generalized tenderness. There is no rebound and no guarding. No hernia.    Genitourinary: Rectal exam shows external hemorrhoid.     Neurological: She is alert and oriented to person, place, and time.  Skin: Skin is warm and dry.    Data Reviewed Prior notes reviewed   Assessment     Thrombosed external hemorrhoid.  Plan   With consent the hemorrhoid was  lanced. After betadine prep, 1 ml 1% xylocaine instilled. The small opening was enlarged in radial orientation to 1cm. 2 large 1cm clots removed. Dressed with dry gauze. Pt advised to use OTC hemorrhoid cream.  Return as needed.     HPI, Physical Exam, Assessment and Plan have been scribed under the direction and in the presence of Mckinley Jewel, MD  Gaspar Cola, CMA   Christene Lye 05/29/2017, 1:26 PM

## 2017-05-29 NOTE — Patient Instructions (Signed)
Return as needed

## 2017-05-30 ENCOUNTER — Encounter: Payer: Self-pay | Admitting: General Surgery

## 2017-05-30 ENCOUNTER — Ambulatory Visit (INDEPENDENT_AMBULATORY_CARE_PROVIDER_SITE_OTHER): Payer: BC Managed Care – PPO | Admitting: General Surgery

## 2017-05-30 VITALS — BP 118/74 | HR 80 | Temp 98.2°F | Resp 12 | Ht 62.0 in | Wt 129.0 lb

## 2017-05-30 DIAGNOSIS — R102 Pelvic and perineal pain: Secondary | ICD-10-CM

## 2017-05-30 NOTE — Progress Notes (Addendum)
Patient ID: Sandra Nguyen, female   DOB: 11/09/1971, 46 y.o.   MRN: [id number]  Chief Complaint  Patient presents with  . Follow-up    abdominal pain    HPI Sandra Nguyen is a 46 y.o. female.  Here today for follow up abdominal pain. She is having lower abdominal pain radiating laterally. Described as a constant cramping dull ache. She has had this since Saturday. No fever or chills just "feels bad". No nausea or vomiting. Bowels move irregular which is not new for her. She does not strain for BM just sits for a length of time. She was here yesterday and had an incision of a thrombosed hemorrhoid by Dr Jamal Collin, she still has some drainage but better. She thinks the antidepressant Effexor is causing decreased appetite. She has been on this for 6 months.  History of chronic constipation, bowel movements one-2 times per week.  She has lost 10 pounds in 3 months. 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  HPI  Past Medical History:  Diagnosis Date  . Anemia   . Breast cancer [facility]) March 2015    Clinical T1c, N0, BRCA negative  . Malignant neoplasm of upper-outer quadrant of female breast [facility]) August 2015   pT1b, N0; M0. 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 Migraines     Past Surgical History:  Procedure Laterality Date  . AUGMENTATION MAMMAPLASTY Bilateral Nov 2016   Dr Tula Nakayama REMOVED 07/2016  . BREAST BIOPSY Right 02/2014   positive  . BREAST RECONSTRUCTION Right 04/14/2015   Procedure: AEROLA/NIPPLE RECONSTRUCTION WITH GRAFT;  Surgeon: Nicholaus Bloom, MD;  Location: [place];  Service: Plastics;  Laterality: Right;  . BREAST SURGERY Right July 10, 2014   Right simple mastectomy with sentinel node biopsy.  . COLONOSCOPY WITH PROPOFOL N/A 05/04/2016   Procedure: COLONOSCOPY WITH PROPOFOL;  Surgeon: Robert Bellow, MD;  Location: [facility] ENDOSCOPY;  Service: Endoscopy;  Laterality: N/A;  . EXCISIONAL HEMORRHOIDECTOMY  2015  . LEEP  1990's  .  MASTECTOMY Right 07/2014   chemo and herceptin tx    Family History  Problem Relation Age of Onset  . Cancer Maternal Aunt        breast cancer, age greater than 66 at diagnosis  . Cancer Maternal Aunt        cervical    Social History Social History  Substance Use Topics  . Smoking status: Never Smoker  . Smokeless tobacco: Never Used  . Alcohol use No    Allergies  Allergen Reactions  . Keflex [Cephalexin] Rash    Current Outpatient Prescriptions  Medication Sig Dispense Refill  . BIOTIN 5000 PO Take 5,000 mg by mouth daily. 7 PM    . clonazePAM (KLONOPIN) 0.5 MG tablet Take 0.5 mg by mouth at bedtime as needed for anxiety.    Marland Kitchen levocetirizine (XYZAL) 5 MG tablet Take 5 mg by mouth every evening.    Marland Kitchen PROAIR HFA 108 (90 Base) MCG/ACT inhaler 1-2 INHALATIONS EVERY 4-6 HOURS AS NEEDED FOR WHEEZING. DISPENSE SPACER AS NEEDED.  0  . tamoxifen (NOLVADEX) 20 MG tablet Take 1 tablet (20 mg total) by mouth daily. 30 tablet 0  . venlafaxine XR (EFFEXOR-XR) 75 MG 24 hr capsule Take 75 mg by mouth daily with breakfast.     . Vitamin D, Ergocalciferol, (DRISDOL) 50000 units CAPS capsule TAKE 1 CAPSULE (50,000 UNITS TOTAL) BY MOUTH ONCE A WEEK. 12 capsule 1   No current facility-administered medications for this visit.  Review of Systems Review of Systems  Constitutional: Positive for appetite change.  Respiratory: Negative.   Cardiovascular: Negative.     Blood pressure 118/74, pulse 80, temperature 98.2 F (36.8 C), temperature source Oral, resp. rate 12, height '5\' 2"'  (1.575 m), weight 129 lb (58.5 kg).  Physical Exam Physical Exam  Constitutional: She is oriented to person, place, and time. She appears well-developed and well-nourished.  HENT:  Mouth/Throat: Oropharynx is clear and moist.  Eyes: Conjunctivae are normal. No scleral icterus.  Neck: Neck supple.  Cardiovascular: Normal rate, regular rhythm, normal heart sounds and intact distal pulses.   Pulses:       Femoral pulses are 2+ on the right side, and 2+ on the left side.      Dorsalis pedis pulses are 2+ on the right side, and 2+ on the left side.  No lower leg edema  Pulmonary/Chest: Effort normal and breath sounds normal.  Abdominal: Soft. Normal appearance and bowel sounds are normal. There is no hepatosplenomegaly. There is tenderness. No hernia.    Tenderness above pubic  Genitourinary: Rectal exam shows external hemorrhoid.     Genitourinary Comments: Left anterior external hemorrhoid   Lymphadenopathy:    She has no cervical adenopathy.  Neurological: She is alert and oriented to person, place, and time.  Skin: Skin is warm and dry.  Psychiatric: Her behavior is normal.    Data Reviewed Estradiol level dated 02/10/2017 was 233 consistent with the ovulatory phase.  Assessment    Pelvic pain, amenorrhea for 3 years with active ovaries. Possible uterine pathology.  Thrombosed external hemorrhoid improved post incision and drainage.    Plan    No immediate need for surgical intervention. We'll arrange for a pelvic ultrasound and CBC.  Symptomatic measures at present. No indication for narcotics.    Recommend pelvic ultrasound and CBC   Recommend adding 1 capful of Miralax in a glass of water used daily to improve bowel function.  HPI, Physical Exam, Assessment and Plan have been scribed under the direction and in the presence of Robert Bellow, MD.  Karie Fetch, RN  The patient is scheduled for a pelvic and transvaginal ultrasound at [place] on 06/05/17 at 1:45 pm. She is to arrive by 1:30 pm and needs to drink 30 ounces of water and hold her bladder about one hour prior. The patient is aware of date, time, and instructions.  Documented by Lesly Rubenstein LPN   Robert Bellow 05/30/2017, 9:29 PM

## 2017-05-30 NOTE — Patient Instructions (Addendum)
The patient is aware to call back for any questions or concerns. Recommend Miralax to daily regimen Recommend pelvic ultrasound and CBC  The patient is scheduled for a pelvic and transvaginal ultrasound at [place] on 06/05/17 at 1:45 pm. She is to arrive by 1:30 pm and needs to drink 30 ounces of water and hold her bladder about one hour prior. The patient is aware of date, time, and instructions.

## 2017-05-31 LAB — CBC WITH DIFFERENTIAL/PLATELET
BASOS ABS: 0 10*3/uL (ref 0.0–0.2)
Basos: 0 %
EOS (ABSOLUTE): 0.1 10*3/uL (ref 0.0–0.4)
EOS: 2 %
HEMATOCRIT: 34.5 % (ref 34.0–46.6)
HEMOGLOBIN: 11.4 g/dL (ref 11.1–15.9)
IMMATURE GRANULOCYTES: 0 %
Immature Grans (Abs): 0 10*3/uL (ref 0.0–0.1)
LYMPHS ABS: 2.7 10*3/uL (ref 0.7–3.1)
Lymphs: 52 %
MCH: 28 pg (ref 26.6–33.0)
MCHC: 33 g/dL (ref 31.5–35.7)
MCV: 85 fL (ref 79–97)
MONOCYTES: 8 %
MONOS ABS: 0.4 10*3/uL (ref 0.1–0.9)
NEUTROS PCT: 38 %
Neutrophils Absolute: 2 10*3/uL (ref 1.4–7.0)
Platelets: 202 10*3/uL (ref 150–379)
RBC: 4.07 x10E6/uL (ref 3.77–5.28)
RDW: 14.4 % (ref 12.3–15.4)
WBC: 5.2 10*3/uL (ref 3.4–10.8)

## 2017-06-01 ENCOUNTER — Telehealth: Payer: Self-pay

## 2017-06-01 NOTE — Telephone Encounter (Signed)
-----   Message from Robert Bellow, MD sent at 05/31/2017  9:07 AM EDT ----- Please notify the patient that the CBC results are at her baseline. No evidence of infection. We'll proceed with pelvic ultrasound is scheduled on Monday. Thank you ----- Message ----- From: Lavone Neri Lab Results In Sent: 05/31/2017   5:37 AM To: Robert Bellow, MD

## 2017-06-01 NOTE — Telephone Encounter (Signed)
Notified patient as instructed, patient pleased. Discussed follow-up appointments, patient agrees  

## 2017-06-02 ENCOUNTER — Inpatient Hospital Stay: Payer: BC Managed Care – PPO | Attending: Internal Medicine | Admitting: Internal Medicine

## 2017-06-02 VITALS — BP 116/72 | HR 67 | Temp 97.9°F | Resp 16 | Wt 133.0 lb

## 2017-06-02 DIAGNOSIS — I471 Supraventricular tachycardia: Secondary | ICD-10-CM | POA: Diagnosis not present

## 2017-06-02 DIAGNOSIS — C50411 Malignant neoplasm of upper-outer quadrant of right female breast: Secondary | ICD-10-CM | POA: Diagnosis not present

## 2017-06-02 DIAGNOSIS — Z9011 Acquired absence of right breast and nipple: Secondary | ICD-10-CM | POA: Insufficient documentation

## 2017-06-02 DIAGNOSIS — Z79899 Other long term (current) drug therapy: Secondary | ICD-10-CM | POA: Diagnosis not present

## 2017-06-02 DIAGNOSIS — K644 Residual hemorrhoidal skin tags: Secondary | ICD-10-CM | POA: Diagnosis not present

## 2017-06-02 DIAGNOSIS — Z9221 Personal history of antineoplastic chemotherapy: Secondary | ICD-10-CM | POA: Diagnosis not present

## 2017-06-02 DIAGNOSIS — Z79810 Long term (current) use of selective estrogen receptor modulators (SERMs): Secondary | ICD-10-CM | POA: Insufficient documentation

## 2017-06-02 DIAGNOSIS — Z170 Estrogen receptor positive status [ER+]: Secondary | ICD-10-CM | POA: Insufficient documentation

## 2017-06-02 NOTE — Assessment & Plan Note (Addendum)
#   STAGE II ER/PR/; tolerating chemo; Her 2 Neu POSITIVE. Clinically NED. Recent mammogram March 2018 NED. Continue tamoxifen- tolerating with mild- moderate side effects [see discussion below].    # myalgias/ arthritis- sec to Tamoxifen. Continue taking supplements chrondroitin/ glucosamine/ vit D 50K/week/exercise.   # RUE SVT-recommend NSAIDs ice packs. If Swelling gets worse- recommend ultrasound   # follow up in 6 months/ no labs.

## 2017-06-02 NOTE — Progress Notes (Signed)
[place] OFFICE PROGRESS NOTE  Patient Care Team: [name], MD as PCP - General (Family Medicine) Dear, [name], MD (Inactive) (Family Medicine) Bary Castilla, Forest Gleason, MD (General Surgery) Forest Gleason, MD (Unknown Physician Specialty) Thornton Park, MD as Consulting Physician (Orthopedic Surgery)  Cancer Staging Carcinoma of upper-outer quadrant of right breast in female, estrogen receptor positive [facility]) Staging form: Breast, AJCC 7th Edition - Clinical: Stage IIA (T2, N0, M0) - Signed by Forest Gleason, MD on 05/09/2015    Oncology History   1. Carcinoma breast (right breast) 2 cm palpable mass Invasive mammary carcinoma.  Estrogen receptor positive.  Progesterone receptor positive.  HER-2/neu receptor amplified. 2. Started on neoadjuvant chemotherapy,  Taxotere, carboplatin and Herceptin and perjeta (March 05, 2014) 3. Patient does not have any clinically significant BRCA mutation Has been on and to [place] Mutation of unknown significance. . 4. 4 cycles of chemotherapy with TCH,perjeta previous did not June of 201 5.  Because of progressing side effect remaining 2 cycles of chemotherapy was not given but patient continued Herceptin and aperjeta  5. Status post right breast simple mastectomy in August of 2015 ypT1  ypN0 stage IB 6.patient started on tamoxifen in October of 2015 along with maintenance Herceptin therapy Patient is going for reconstructive surgery on October 21, 2014 final reconstructive surgery was done in February of 2016 [name] finished Herceptin therapy on March 11, 2015     Carcinoma of upper-outer quadrant of right breast in female, estrogen receptor positive ([place])   02/18/2014 Initial Diagnosis    Breast cancer of upper-outer quadrant of right female breast        INTERVAL HISTORY:  Sandra Nguyen 46 y.o.  female pleasant patient above history of right-sided breast cancer stage II ER/PR positive HER-2/neu positive status post mastectomy  is here for follow-up. Patient is tamoxifen.  Patient recently participated fundraising event  breast cancer support.     In the interim patient was evaluated by surgery- 4 rectal pain found to have external hemorrhoid.  Patient noted to have mild swelling and tenderness in the right upper extremity. No significant swelling of the arm.   No lumps or bumps noted. Denies any bone pain. She continues to have joint pains; intermittent hot flashes. States "learnt to live with pain".   REVIEW OF SYSTEMS:  A complete 10 point review of system is done which is negative except mentioned above/history of present illness.   PAST MEDICAL HISTORY :  Past Medical History:  Diagnosis Date  . Anemia   . Breast cancer [facility]) March 2015    Clinical T1c, N0, BRCA negative  . Malignant neoplasm of upper-outer quadrant of female breast [facility]) August 2015   pT1b, N0; M0. 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 Migraines     PAST SURGICAL HISTORY :   Past Surgical History:  Procedure Laterality Date  . AUGMENTATION MAMMAPLASTY Bilateral Nov 2016   Dr Tula Nakayama REMOVED 07/2016  . BREAST BIOPSY Right 02/2014   positive  . BREAST RECONSTRUCTION Right 04/14/2015   Procedure: AEROLA/NIPPLE RECONSTRUCTION WITH GRAFT;  Surgeon: Nicholaus Bloom, MD;  Location: [place];  Service: Plastics;  Laterality: Right;  . BREAST SURGERY Right July 10, 2014   Right simple mastectomy with sentinel node biopsy.  . COLONOSCOPY WITH PROPOFOL N/A 05/04/2016   Procedure: COLONOSCOPY WITH PROPOFOL;  Surgeon: Robert Bellow, MD;  Location: [facility] ENDOSCOPY;  Service: Endoscopy;  Laterality: N/A;  . EXCISIONAL HEMORRHOIDECTOMY  2015  . LEEP  1990's  .  MASTECTOMY Right 07/2014   chemo and herceptin tx    FAMILY HISTORY :   Family History  Problem Relation Age of Onset  . Cancer Maternal Aunt        breast cancer, age greater than 11 at diagnosis  . Cancer Maternal Aunt        cervical    SOCIAL HISTORY:    Social History  Substance Use Topics  . Smoking status: Never Smoker  . Smokeless tobacco: Never Used  . Alcohol use No    ALLERGIES:  is allergic to keflex [cephalexin].  MEDICATIONS:  Current Outpatient Prescriptions  Medication Sig Dispense Refill  . BIOTIN 5000 PO Take 5,000 mg by mouth daily. 7 PM    . clonazePAM (KLONOPIN) 0.5 MG tablet Take 0.5 mg by mouth at bedtime as needed for anxiety.    Marland Kitchen levocetirizine (XYZAL) 5 MG tablet Take 5 mg by mouth every evening.    . tamoxifen (NOLVADEX) 20 MG tablet Take 1 tablet (20 mg total) by mouth daily. 30 tablet 0  . venlafaxine XR (EFFEXOR-XR) 75 MG 24 hr capsule Take 75 mg by mouth daily with breakfast.     . Vitamin D, Ergocalciferol, (DRISDOL) 50000 units CAPS capsule TAKE 1 CAPSULE (50,000 UNITS TOTAL) BY MOUTH ONCE A WEEK. 12 capsule 1   No current facility-administered medications for this visit.     PHYSICAL EXAMINATION: ECOG PERFORMANCE STATUS: 0 - Asymptomatic  BP 116/72 (BP Location: Left Arm, Patient Position: Sitting)   Pulse 67   Temp 97.9 F (36.6 C) (Tympanic)   Resp 16   Wt 133 lb (60.3 kg)   BMI 24.33 kg/m   Filed Weights   06/02/17 1508  Weight: 133 lb (60.3 kg)    GENERAL: Well-nourished well-developed; Alert, no distress and comfortable.   Alone.  EYES: no pallor or icterus OROPHARYNX: no thrush or ulceration; good dentition  NECK: supple, no masses felt LYMPH:  no palpable lymphadenopathy in the cervical, axillary or inguinal regions LUNGS: clear to auscultation and  No wheeze or crackles HEART/CVS: regular rate & rhythm and no murmurs; No lower extremity edema ABDOMEN:abdomen soft, non-tender and normal bowel sounds Musculoskeletal:no cyanosis of digits and no clubbing  PSYCH: alert & oriented x 3 with fluent speech NEURO: no focal motor/sensory deficits SKIN:  no rashes or significant lesions; mild swelling arm of the upper extremity along the vein/ cord felt   LABORATORY DATA:  I have  reviewed the data as listed    Component Value Date/Time   NA 139 10/06/2016 1104   NA 143 12/31/2014 1313   K 3.7 10/06/2016 1104   K 3.5 12/31/2014 1313   CL 109 10/06/2016 1104   CL 110 (H) 12/31/2014 1313   CO2 25 10/06/2016 1104   CO2 28 12/31/2014 1313   GLUCOSE 110 (H) 10/06/2016 1104   GLUCOSE 124 (H) 12/31/2014 1313   BUN 19 10/06/2016 1104   BUN 13 12/31/2014 1313   CREATININE 0.93 10/06/2016 1104   CREATININE 0.89 12/31/2014 1313   CALCIUM 9.3 10/06/2016 1104   CALCIUM 8.4 (L) 12/31/2014 1313   PROT 7.0 10/06/2016 1104   PROT 6.9 12/31/2014 1313   ALBUMIN 3.9 10/06/2016 1104   ALBUMIN 3.5 12/31/2014 1313   AST 22 10/06/2016 1104   AST 21 12/31/2014 1313   ALT 12 (L) 10/06/2016 1104   ALT 23 12/31/2014 1313   ALKPHOS 61 10/06/2016 1104   ALKPHOS 66 12/31/2014 1313   BILITOT 0.3 10/06/2016 1104  BILITOT 0.2 12/31/2014 1313   GFRNONAA >60 10/06/2016 1104   GFRNONAA >60 12/31/2014 1313   GFRNONAA >60 07/16/2014 1351   GFRAA >60 10/06/2016 1104   GFRAA >60 12/31/2014 1313   GFRAA >60 07/16/2014 1351    No results found for: SPEP, UPEP  Lab Results  Component Value Date   WBC 5.2 05/30/2017   NEUTROABS 2.0 05/30/2017   HGB 11.4 05/30/2017   HCT 34.5 05/30/2017   MCV 85 05/30/2017   PLT 202 05/30/2017      Chemistry      Component Value Date/Time   NA 139 10/06/2016 1104   NA 143 12/31/2014 1313   K 3.7 10/06/2016 1104   K 3.5 12/31/2014 1313   CL 109 10/06/2016 1104   CL 110 (H) 12/31/2014 1313   CO2 25 10/06/2016 1104   CO2 28 12/31/2014 1313   BUN 19 10/06/2016 1104   BUN 13 12/31/2014 1313   CREATININE 0.93 10/06/2016 1104   CREATININE 0.89 12/31/2014 1313      Component Value Date/Time   CALCIUM 9.3 10/06/2016 1104   CALCIUM 8.4 (L) 12/31/2014 1313   ALKPHOS 61 10/06/2016 1104   ALKPHOS 66 12/31/2014 1313   AST 22 10/06/2016 1104   AST 21 12/31/2014 1313   ALT 12 (L) 10/06/2016 1104   ALT 23 12/31/2014 1313   BILITOT 0.3 10/06/2016  1104   BILITOT 0.2 12/31/2014 1313       RADIOGRAPHIC STUDIES: I have personally reviewed the radiological images as listed and agreed with the findings in the report. No results found.   ASSESSMENT & PLAN:  Carcinoma of upper-outer quadrant of right breast in female, estrogen receptor positive ([place]) # STAGE II ER/PR/; tolerating chemo; Her 2 Neu POSITIVE. Clinically NED. Recent mammogram March 2018 NED. Continue tamoxifen- tolerating with mild- moderate side effects [see discussion below].    # myalgias/ arthritis- sec to Tamoxifen. Continue taking supplements chrondroitin/ glucosamine/ vit D 50K/week/exercise.   # RUE SVT-recommend NSAIDs ice packs. If Swelling gets worse- recommend ultrasound   # follow up in 6 months/ no labs.    Orders Placed This Encounter  Procedures  . CBC with Differential/Platelet    Standing Status:   Future    Standing Expiration Date:   06/02/2018  . Comprehensive metabolic panel    Standing Status:   Future    Standing Expiration Date:   06/02/2018      Cammie Sickle, MD 06/02/2017 5:41 PM

## 2017-06-02 NOTE — Progress Notes (Signed)
Patient here today for follow up.   

## 2017-06-05 ENCOUNTER — Ambulatory Visit
Admission: RE | Admit: 2017-06-05 | Discharge: 2017-06-05 | Disposition: A | Discharge status: Home / Self Care | Payer: BC Managed Care – PPO | Source: Ambulatory Visit | Attending: General Surgery | Admitting: General Surgery

## 2017-06-05 DIAGNOSIS — R102 Pelvic and perineal pain: Secondary | ICD-10-CM

## 2017-06-05 DIAGNOSIS — R938 Abnormal findings on diagnostic imaging of other specified body structures: Secondary | ICD-10-CM | POA: Insufficient documentation

## 2017-06-06 ENCOUNTER — Telehealth: Payer: Self-pay | Admitting: *Deleted

## 2017-06-06 NOTE — Telephone Encounter (Signed)
Patient called and wanted to let you know that she does have an appointment with her her GYN [name]) on 0/38/33

## 2017-06-19 ENCOUNTER — Other Ambulatory Visit: Payer: Self-pay | Admitting: Internal Medicine

## 2017-06-22 ENCOUNTER — Ambulatory Visit (INDEPENDENT_AMBULATORY_CARE_PROVIDER_SITE_OTHER): Payer: BC Managed Care – PPO | Admitting: Obstetrics and Gynecology

## 2017-06-22 ENCOUNTER — Encounter: Payer: Self-pay | Admitting: Obstetrics and Gynecology

## 2017-06-22 VITALS — BP 110/70 | HR 72 | Ht 63.0 in | Wt 130.0 lb

## 2017-06-22 DIAGNOSIS — Z1239 Encounter for other screening for malignant neoplasm of breast: Secondary | ICD-10-CM

## 2017-06-22 DIAGNOSIS — R938 Abnormal findings on diagnostic imaging of other specified body structures: Secondary | ICD-10-CM

## 2017-06-22 DIAGNOSIS — Z79810 Long term (current) use of selective estrogen receptor modulators (SERMs): Secondary | ICD-10-CM | POA: Diagnosis not present

## 2017-06-22 DIAGNOSIS — Z1231 Encounter for screening mammogram for malignant neoplasm of breast: Secondary | ICD-10-CM

## 2017-06-22 DIAGNOSIS — Z01419 Encounter for gynecological examination (general) (routine) without abnormal findings: Secondary | ICD-10-CM

## 2017-06-22 DIAGNOSIS — R9389 Abnormal findings on diagnostic imaging of other specified body structures: Secondary | ICD-10-CM | POA: Insufficient documentation

## 2017-06-22 NOTE — Addendum Note (Signed)
Addended by: Ardeth Perfect B on: 7/40/8144 09:59 AM   Modules accepted: Orders

## 2017-06-22 NOTE — Progress Notes (Addendum)
Chief Complaint  Patient presents with  . Gynecologic Exam    wants everything ripped out - going thru menopause and trying to have a period     HPI:      [name] is a 46 y.o. G2P0011 who LMP was No LMP recorded. Patient is not currently having periods (Reason: Chemotherapy)., presents today for her annual examination.  Her menses are absent due to chemo/tamoxifen. She does not have intermenstrual bleeding.  Pt was having pelvic pain and had u/s through Dr. Bary Castilla 06/05/17. "EM: Thickness: 11.7 cm. The endometrium demonstrates multiple complex appearing fluid filled foci with pigment largest measuring just over 4 mm in diameter. No discrete solid endometrial mass is observed." Pt due for endometrial biopsy since on tamoxifen.  If neg, due to [facility] per radiology.   Sex activity:  Not currently. She does have vaginal dryness. Last Pap: June 20, 2016  Results were: ASCUS with NEGATIVE high risk HPV /neg HPV DNA  Hx of STDs: none  Last mammogram:02/06/17; Results were: normal--routine follow-up in 12 months. Pt followed by Dr. Bary Castilla due to s/p RT breast cancer 2015. Pt is s/p RT mastectomy, herceptin tx, reconstruction and now tamoxifen. She is My Risk neg.  There is no FH of breast cancer. There is no FH of ovarian cancer. The patient does do self-breast exams.  Tobacco use: The patient denies current or previous tobacco use. Alcohol use: none Exercise: moderately active  She does get adequate calcium and Vitamin D in her diet.  Labs with PCP.  Had neg colonoscopy 3/17-repeat due in 10 yrs.   Past Medical History:  Diagnosis Date  . Anemia   . Atypical squamous cell changes of undetermined significance (ASCUS) on cervical cytology with negative high risk human papilloma virus (HPV) test result 06/20/2016  . Breast cancer [facility]) March 2015    Clinical T1c, N0, BRCA negative  . Breast mass   . Dysplasia of cervix, low grade ([place] 1)   . Genetic testing of female 02/2014    CHEK2(09/22/16 no clinical significance)  . Malignant neoplasm of upper-outer quadrant of female breast [facility]) August 2015   pT1b, N0; M0. 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 Menorrhagia   . Migraines   . Vitamin D deficiency     Past Surgical History:  Procedure Laterality Date  . AUGMENTATION MAMMAPLASTY Bilateral Nov 2016   Dr Tula Nakayama REMOVED 07/2016  . BREAST BIOPSY Right 02/2014   positive  . BREAST RECONSTRUCTION Right 04/14/2015   Procedure: AEROLA/NIPPLE RECONSTRUCTION WITH GRAFT;  Surgeon: Nicholaus Bloom, MD;  Location: [place];  Service: Plastics;  Laterality: Right;  . BREAST SURGERY Right July 10, 2014   Right simple mastectomy with sentinel node biopsy.  . CERVICAL BIOPSY  W/ LOOP ELECTRODE EXCISION  1998  . COLONOSCOPY WITH PROPOFOL N/A 05/04/2016   Procedure: COLONOSCOPY WITH PROPOFOL;  Surgeon: Robert Bellow, MD;  Location: [facility] ENDOSCOPY;  Service: Endoscopy;  Laterality: N/A;  . ESSURE TUBAL LIGATION  09/15/2010  . EXCISIONAL HEMORRHOIDECTOMY  2015  . I and D of hemorrhoid  05/2017   Dr. Jamal Collin  . MASTECTOMY Right 07/2014   chemo and herceptin tx    Family History  Problem Relation Age of Onset  . Breast cancer Maternal Aunt 70  . Cervical cancer Maternal Aunt 65  . Hypertension Mother   . Heart disease Maternal Grandmother   . Ovarian cancer Neg Hx   . Colon cancer Neg Hx     Social  History   Social History  . Marital status: Married    Spouse name: N/A  . Number of children: N/A  . Years of education: N/A   Occupational History  . Not on file.   Social History Main Topics  . Smoking status: Never Smoker  . Smokeless tobacco: Never Used  . Alcohol use No  . Drug use: No  . Sexual activity: Yes    Birth control/ protection: Surgical   Other Topics Concern  . Not on file   Social History Narrative  . No narrative on file     Current Outpatient Prescriptions:  .  BIOTIN 5000 PO, Take 5,000 mg by mouth daily. 7  PM, Disp: , Rfl:  .  clonazePAM (KLONOPIN) 0.5 MG tablet, Take 0.5 mg by mouth at bedtime as needed for anxiety., Disp: , Rfl:  .  tamoxifen (NOLVADEX) 20 MG tablet, TAKE 1 TABLET BY MOUTH EVERY DAY, Disp: 30 tablet, Rfl: 0 .  Vitamin D, Ergocalciferol, (DRISDOL) 50000 units CAPS capsule, TAKE 1 CAPSULE (50,000 UNITS TOTAL) BY MOUTH ONCE A WEEK., Disp: 12 capsule, Rfl: 1  ROS:  Review of Systems  Constitutional: Negative for fatigue, fever and unexpected weight change.  Respiratory: Negative for cough, shortness of breath and wheezing.   Cardiovascular: Negative for chest pain, palpitations and leg swelling.  Gastrointestinal: Negative for blood in stool, constipation, diarrhea, nausea and vomiting.  Endocrine: Negative for cold intolerance, heat intolerance and polyuria.  Genitourinary: Negative for dyspareunia, dysuria, flank pain, frequency, genital sores, hematuria, menstrual problem, pelvic pain, urgency, vaginal bleeding, vaginal discharge and vaginal pain.  Musculoskeletal: Negative for back pain, joint swelling and myalgias.  Skin: Negative for rash.  Neurological: Negative for dizziness, syncope, light-headedness, numbness and headaches.  Hematological: Negative for adenopathy.  Psychiatric/Behavioral: Negative for agitation, confusion, sleep disturbance and suicidal ideas. The patient is not nervous/anxious.      Objective: BP 110/70 (Patient Position: Sitting)   Pulse 72   Ht '5\' 3"'  (1.6 m)   Wt 130 lb (59 kg)   BMI 23.03 kg/m    Physical Exam  Constitutional: She is oriented to person, place, and time. She appears well-developed and well-nourished.  Genitourinary: Vagina normal and uterus normal. There is no rash or tenderness on the right labia. There is no rash or tenderness on the left labia. No erythema or tenderness in the vagina. No vaginal discharge found. Right adnexum does not display mass and does not display tenderness. Left adnexum does not display mass and  does not display tenderness. Cervix does not exhibit motion tenderness or polyp. Uterus is not enlarged or tender.  Neck: Normal range of motion. No thyromegaly present.  Cardiovascular: Normal rate, regular rhythm and normal heart sounds.   No murmur heard. Pulmonary/Chest: Effort normal and breath sounds normal. Right breast exhibits no mass, no nipple discharge, no skin change and no tenderness. Left breast exhibits no mass, no nipple discharge, no skin change and no tenderness.  Abdominal: Soft. There is no tenderness. There is no guarding.  Musculoskeletal: Normal range of motion.  Neurological: She is alert and oriented to person, place, and time. No cranial nerve deficit.  Psychiatric: She has a normal mood and affect. Her behavior is normal.  Vitals reviewed.   Assessment/Plan: Encounter for annual routine gynecological examination  Screening for breast cancer - Pt followed by Dr. Bary Castilla since s/p RT breast cancer.   Endometrial thickening on ultrasound - EMB today due to tamoxifen. Will call pt with results. If  neg, pt needs SHGM. Will do Rx cytotec if so.  Use of tamoxifen (Nolvadex)  GYN counsel adequate intake of calcium and vitamin D   F/U  Return in about 1 year (around 06/22/2018).   Endometrial Biopsy After discussion with the patient regarding her abnormal uterine bleeding I recommended that she proceed with an endometrial biopsy for further diagnosis. The risks, benefits, alternatives, and indications for an endometrial biopsy were discussed with the patient in detail. She understood the risks including infection, bleeding, cervical laceration and uterine perforation.  Verbal consent was obtained.   PROCEDURE NOTE:  [place] endometrial biopsy was performed using aseptic technique with iodine preparation.  The uterus was sounded to a length of 7.0 cm.  Adequate sampling was obtained with minimal blood loss.  The patient tolerated the procedure well.  Disposition will  be pending pathology.              Emmalene Kattner B. Makendra Vigeant, PA-C 06/22/2017 9:39 AM

## 2017-06-26 LAB — PATHOLOGY

## 2017-06-29 ENCOUNTER — Other Ambulatory Visit: Payer: Self-pay | Admitting: Obstetrics and Gynecology

## 2017-06-29 DIAGNOSIS — R9389 Abnormal findings on diagnostic imaging of other specified body structures: Secondary | ICD-10-CM

## 2017-06-29 NOTE — Progress Notes (Signed)
Pt aware of no endometrial tissue on EMB. Will sched SHGM due to thickened endometrial tissue on CT scan and on tamoxifen. Pt agrees with plan. Felicia to sched and notify pt.

## 2017-07-03 ENCOUNTER — Telehealth: Payer: Self-pay

## 2017-07-03 NOTE — Telephone Encounter (Signed)
Pt calling c/o excessive bleeding.  [phone] Pt states she hasn't had a cycle since 2015 d/t chemo.

## 2017-07-03 NOTE — Telephone Encounter (Signed)
Most likely related to EMB. How frequently is she changing pads/tampons? Has SHGM sched with [facility]. Ok to wait if not our version of excessive bleeding.

## 2017-07-04 NOTE — Telephone Encounter (Signed)
Pt is calling back to speak with someone about her current issue. Please advise

## 2017-07-04 NOTE — Telephone Encounter (Signed)
Pt states she is saturating a pad q20min to 1hr.  Adv our protocol is that she go to the ED.  She does not want to go to the ED.  She had rather be seen by [facility].  Appt made.

## 2017-07-05 ENCOUNTER — Telehealth: Payer: Self-pay | Admitting: Obstetrics and Gynecology

## 2017-07-05 NOTE — Telephone Encounter (Signed)
[place] on pt's heavy bleeding. Pt due to [facility] for thickened EM, but if still heavy, recommend D&C instead.

## 2017-07-06 NOTE — Telephone Encounter (Signed)
Spoke with pt re: bleeding. She had dark spotting for 2 days over the weekend (7/28), and then heavy flow for 2 days, changing pads/tampons Q30-60 min. Bleeding has since tapered off and is now gone, but pt still has cramping. Pt was supposed to have SHGM due to endometrial thickening on tamoxifen, but pt is ready to get "this over with".  Will refer to MD for hysteroscopy/D&C conf, since that would be next step if [facility] abn anyway. Pt to see MD next wk but f/u sooner if bleeding resumes.

## 2017-07-13 ENCOUNTER — Encounter: Payer: Self-pay | Admitting: Obstetrics & Gynecology

## 2017-07-13 ENCOUNTER — Ambulatory Visit (INDEPENDENT_AMBULATORY_CARE_PROVIDER_SITE_OTHER): Payer: BC Managed Care – PPO | Admitting: Obstetrics & Gynecology

## 2017-07-13 VITALS — BP 110/64 | HR 74 | Ht 63.0 in | Wt 128.0 lb

## 2017-07-13 DIAGNOSIS — N926 Irregular menstruation, unspecified: Secondary | ICD-10-CM

## 2017-07-13 DIAGNOSIS — R938 Abnormal findings on diagnostic imaging of other specified body structures: Secondary | ICD-10-CM

## 2017-07-13 DIAGNOSIS — R9389 Abnormal findings on diagnostic imaging of other specified body structures: Secondary | ICD-10-CM

## 2017-07-13 NOTE — Addendum Note (Signed)
Addended by: Gae Dry on: 07/13/2017 05:03 PM   Modules accepted: Orders, SmartSet

## 2017-07-13 NOTE — Progress Notes (Signed)
PRE-OPERATIVE HISTORY AND PHYSICAL EXAM  HPI:  Sandra Nguyen is a 46 y.o. [id number]; she is being admitted for surgery related to abnormal uterine bleeding and endometrial thickening by imaging- she has had chemo for breast cancer leading to amenorrhea for a while, and on Tamoxifen as well for the last 2 years.  PMHx: Past Medical History:  Diagnosis Date  . Anemia   . Atypical squamous cell changes of undetermined significance (ASCUS) on cervical cytology with negative high risk human papilloma virus (HPV) test result 06/20/2016  . Breast cancer [facility]) March 2015    Clinical T1c, N0, BRCA negative  . Breast mass   . Dysplasia of cervix, low grade (CIN 1)   . 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 Malignant neoplasm of upper-outer quadrant of female breast [facility]) August 2015   pT1b, N0; M0. 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 Menorrhagia   . Migraines   . Vitamin D deficiency    Past Surgical History:  Procedure Laterality Date  . AUGMENTATION MAMMAPLASTY Bilateral Nov 2016   Dr Tula Nakayama REMOVED 07/2016  . BREAST BIOPSY Right 02/2014   positive  . BREAST RECONSTRUCTION Right 04/14/2015   Procedure: AEROLA/NIPPLE RECONSTRUCTION WITH GRAFT;  Surgeon: Nicholaus Bloom, MD;  Location: [place];  Service: Plastics;  Laterality: Right;  . BREAST SURGERY Right July 10, 2014   Right simple mastectomy with sentinel node biopsy.  . CERVICAL BIOPSY  W/ LOOP ELECTRODE EXCISION  1998  . COLONOSCOPY WITH PROPOFOL N/A 05/04/2016   Procedure: COLONOSCOPY WITH PROPOFOL;  Surgeon: Nabilah Davoli Bellow, MD;  Location: [facility] ENDOSCOPY;  Service: Endoscopy;  Laterality: N/A;  . ESSURE TUBAL LIGATION  09/15/2010  . EXCISIONAL HEMORRHOIDECTOMY  2015  . I and D of hemorrhoid  05/2017   Dr. Jamal Collin  . MASTECTOMY Right 07/2014   chemo and herceptin tx   Family History  Problem Relation Age of Onset  . Breast cancer Maternal Aunt 70  . Cervical  cancer Maternal Aunt 65  . Hypertension Mother   . Heart disease Maternal Grandmother   . Ovarian cancer Neg Hx   . Colon cancer Neg Hx    Social History  Substance Use Topics  . Smoking status: Never Smoker  . Smokeless tobacco: Never Used  . Alcohol use No   No outpatient prescriptions have been marked as taking for the 07/13/17 encounter (Office Visit) with Gae Dry, MD.   Allergies: Keflex [cephalexin]  Review of Systems  Constitutional: Negative for chills, fever and malaise/fatigue.  HENT: Negative for congestion, sinus pain and sore throat.   Eyes: Negative for blurred vision and pain.  Respiratory: Negative for cough and wheezing.   Cardiovascular: Negative for chest pain and leg swelling.  Gastrointestinal: Negative for abdominal pain, constipation, diarrhea, heartburn, nausea and vomiting.  Genitourinary: Negative for dysuria, frequency, hematuria and urgency.  Musculoskeletal: Negative for back pain, joint pain, myalgias and neck pain.  Skin: Negative for itching and rash.  Neurological: Negative for dizziness, tremors and weakness.  Endo/Heme/Allergies: Does not bruise/bleed easily.  Psychiatric/Behavioral: Negative for depression. The patient is not nervous/anxious and does not have insomnia.     Objective: BP 110/64 (BP Location: Left Arm, Patient Position: Sitting, Cuff Size: Normal)   Pulse 74   Ht _0  (1.6 m)   Wt 128 lb (58.1 kg)   BMI 22.67 kg/m   Filed Weights   07/13/17 1317  Weight: 128 lb (58.1 kg)  Physical Exam  Vitals reviewed. Physical examination Constitutional NAD, Conversant  HEENT Atraumatic; Op clear with mmm.  Normo-cephalic. Pupils reactive. Anicteric sclerae  Thyroid/Neck Smooth without nodularity or enlargement. Normal ROM.  Neck Supple.  Skin No rashes, lesions or ulceration. Normal palpated skin turgor. No nodularity.  Lungs: Clear to auscultation.No rales or wheezes. Normal Respiratory effort, no retractions.  Heart:  NSR.  No murmurs or rubs appreciated. No periferal edema  Abdomen: Soft.  Non-tender.  No masses.  No HSM. No hernia  Extremities: Moves all appropriately.  Normal ROM for age. No lymphadenopathy.  Neuro: Grossly intact  Psych: Oriented to PPT.  Normal mood. Normal affect.   Assessment: 1. Endometrial thickening on ultrasound   2. Irregular bleeding   Plan Hyst D&C.  Discussed alternative options, such as hysterectomy,but as we do not yet know if cancer or hyperplasia is present (inadequate EMB), then would prefer to have diagnosis before hyst.  Also may not need hyst if bleeding pattern resolves.  I have had a careful discussion with this patient about all the options available and the risk/benefits of each. I have fully informed this patient that surgery may subject her to a variety of discomforts and risks: She understands that most patients have surgery with little difficulty, but problems can happen ranging from minor to fatal. These include nausea, vomiting, pain, bleeding, infection, poor healing, hernia, or formation of adhesions. Unexpected reactions may occur from any drug or anesthetic given. Unintended injury may occur to other pelvic or abdominal structures such as Fallopian tubes, ovaries, bladder, ureter (tube from kidney to bladder), or bowel. Nerves going from the pelvis to the legs may be injured. Any such injury may require immediate or later additional surgery to correct the problem. Excessive blood loss requiring transfusion is very unlikely but possible. Dangerous blood clots may form in the legs or lungs. Physical and sexual activity will be restricted in varying degrees for an indeterminate period of time but most often 2-6 weeks.  Finally, she understands that it is impossible to list every possible undesirable effect and that the condition for which surgery is done is not always cured or significantly improved, and in rare cases may be even worse.Ample time was given to answer  all questions.  Barnett Applebaum, MD, [facility], [facility] 07/13/2017  1:45 PM

## 2017-07-17 ENCOUNTER — Ambulatory Visit: Payer: BC Managed Care – PPO | Admitting: Obstetrics and Gynecology

## 2017-07-19 ENCOUNTER — Other Ambulatory Visit: Payer: Self-pay | Admitting: Internal Medicine

## 2017-07-27 ENCOUNTER — Encounter
Admission: RE | Admit: 2017-07-27 | Discharge: 2017-07-27 | Disposition: A | Discharge status: Home / Self Care | Payer: BC Managed Care – PPO | Source: Ambulatory Visit | Attending: Obstetrics & Gynecology | Admitting: Obstetrics & Gynecology

## 2017-07-27 NOTE — Patient Instructions (Signed)
  Your procedure is scheduled on: 08/03/17 Report to Day Surgery. [facility] SECOND FLOOR To find out your arrival time please call [phone] between 1PM - 3PM on 08/02/17  Remember: Instructions that are not followed completely may result in serious medical risk, up to and including death, or upon the discretion of your surgeon and anesthesiologist your surgery may need to be rescheduled.    __X__ 1. Do not eat food or drink liquids after midnight. No gum chewing or hard candies.     ____ 2. No Alcohol for 24 hours before or after surgery.   ____ 3. Do Not Smoke For 24 Hours Prior to Your Surgery.   ____ 4. Bring all medications with you on the day of surgery if instructed.    _X___ 5. Notify your doctor if there is any change in your medical condition     (cold, fever, infections).       Do not wear jewelry, make-up, hairpins, clips or nail polish.  Do not wear lotions, powders, or perfumes. You may wear deodorant.  Do not shave 48 hours prior to surgery. Men may shave face and neck.  Do not bring valuables to the hospital.    [facility] is not responsible for any belongings or valuables.               Contacts, dentures or bridgework may not be worn into surgery.  Leave your suitcase in the car. After surgery it may be brought to your room.  For patients admitted to the hospital, discharge time is determined by your                treatment team.   Patients discharged the day of surgery will not be allowed to drive home.    ____ Take these medicines the morning of surgery with A SIP OF WATER:    1. NONE  2.   3.   4.  5.  6.  ____ Fleet Enema (as directed)   ____ Use CHG Soap as directed  ____ Use inhalers on the day of surgery  ____ Stop metformin 2 days prior to surgery    ____ Take 1/2 of usual insulin dose the night before surgery and none on the morning of surgery.   ____ Stop Coumadin/Plavix/aspirin on   ____ Stop Anti-inflammatories on  ____ Stop  supplements until after surgery.    ____ Bring C-Pap to the hospital.

## 2017-08-02 ENCOUNTER — Encounter (HOSPITAL_COMMUNITY): Payer: Self-pay

## 2017-08-02 ENCOUNTER — Encounter
Admission: RE | Admit: 2017-08-02 | Discharge: 2017-08-02 | Disposition: A | Discharge status: Home / Self Care | Payer: BC Managed Care – PPO | Source: Ambulatory Visit | Attending: Obstetrics & Gynecology | Admitting: Obstetrics & Gynecology

## 2017-08-02 DIAGNOSIS — Z853 Personal history of malignant neoplasm of breast: Secondary | ICD-10-CM | POA: Diagnosis not present

## 2017-08-02 DIAGNOSIS — E559 Vitamin D deficiency, unspecified: Secondary | ICD-10-CM | POA: Diagnosis not present

## 2017-08-02 DIAGNOSIS — N920 Excessive and frequent menstruation with regular cycle: Secondary | ICD-10-CM | POA: Diagnosis not present

## 2017-08-02 DIAGNOSIS — R938 Abnormal findings on diagnostic imaging of other specified body structures: Secondary | ICD-10-CM | POA: Diagnosis present

## 2017-08-02 LAB — CBC
HEMATOCRIT: 34.6 % — AB (ref 35.0–47.0)
HEMOGLOBIN: 11.4 g/dL — AB (ref 12.0–16.0)
MCH: 28.9 pg (ref 26.0–34.0)
MCHC: 33 g/dL (ref 32.0–36.0)
MCV: 87.6 fL (ref 80.0–100.0)
Platelets: 199 10*3/uL (ref 150–440)
RBC: 3.94 MIL/uL (ref 3.80–5.20)
RDW: 13.7 % (ref 11.5–14.5)
WBC: 4.1 10*3/uL (ref 3.6–11.0)

## 2017-08-02 LAB — TYPE AND SCREEN
ABO/RH(D): B POS
ANTIBODY SCREEN: NEGATIVE

## 2017-08-02 NOTE — Progress Notes (Signed)
Social worker [facility]. 

## 2017-08-03 ENCOUNTER — Ambulatory Visit
Admission: RE | Admit: 2017-08-03 | Discharge: 2017-08-03 | Disposition: A | Discharge status: Home / Self Care | Payer: BC Managed Care – PPO | Source: Ambulatory Visit | Attending: Obstetrics & Gynecology | Admitting: Obstetrics & Gynecology

## 2017-08-03 ENCOUNTER — Encounter: Payer: Self-pay | Admitting: *Deleted

## 2017-08-03 ENCOUNTER — Ambulatory Visit: Payer: BC Managed Care – PPO | Admitting: Anesthesiology

## 2017-08-03 ENCOUNTER — Encounter
Admission: RE | Disposition: A | Discharge status: Home / Self Care | Payer: Self-pay | Source: Ambulatory Visit | Attending: Obstetrics & Gynecology

## 2017-08-03 DIAGNOSIS — N920 Excessive and frequent menstruation with regular cycle: Secondary | ICD-10-CM | POA: Insufficient documentation

## 2017-08-03 DIAGNOSIS — Z853 Personal history of malignant neoplasm of breast: Secondary | ICD-10-CM | POA: Insufficient documentation

## 2017-08-03 DIAGNOSIS — R938 Abnormal findings on diagnostic imaging of other specified body structures: Secondary | ICD-10-CM | POA: Insufficient documentation

## 2017-08-03 DIAGNOSIS — R9389 Abnormal findings on diagnostic imaging of other specified body structures: Secondary | ICD-10-CM | POA: Diagnosis present

## 2017-08-03 DIAGNOSIS — E559 Vitamin D deficiency, unspecified: Secondary | ICD-10-CM | POA: Insufficient documentation

## 2017-08-03 HISTORY — PX: DILATATION & CURETTAGE/HYSTEROSCOPY WITH MYOSURE: SHX6511

## 2017-08-03 LAB — POCT PREGNANCY, URINE: PREG TEST UR: NEGATIVE

## 2017-08-03 SURGERY — DILATATION & CURETTAGE/HYSTEROSCOPY WITH MYOSURE
Anesthesia: General

## 2017-08-03 MED ORDER — FENTANYL CITRATE (PF) 100 MCG/2ML IJ SOLN
INTRAMUSCULAR | Status: DC | PRN
  Administered 2017-08-03: 25 ug via INTRAVENOUS
  Administered 2017-08-03: 50 ug via INTRAVENOUS
  Administered 2017-08-03: 25 ug via INTRAVENOUS

## 2017-08-03 MED ORDER — MIDAZOLAM HCL 2 MG/2ML IJ SOLN
INTRAMUSCULAR | Status: AC
  Filled 2017-08-03: qty 2

## 2017-08-03 MED ORDER — LACTATED RINGERS IV SOLN: INTRAVENOUS | Status: DC

## 2017-08-03 MED ORDER — MEPERIDINE HCL 50 MG/ML IJ SOLN: 6.2500 mg | INTRAMUSCULAR | Status: DC | PRN

## 2017-08-03 MED ORDER — LACTATED RINGERS IV SOLN
INTRAVENOUS | Status: DC
  Administered 2017-08-03: 75 mL/h via INTRAVENOUS

## 2017-08-03 MED ORDER — ONDANSETRON HCL 4 MG/2ML IJ SOLN
INTRAMUSCULAR | Status: DC | PRN
  Administered 2017-08-03: 4 mg via INTRAVENOUS

## 2017-08-03 MED ORDER — FAMOTIDINE 20 MG PO TABS
ORAL_TABLET | ORAL | Status: DC
Start: 2017-08-03 — End: 2017-08-03
  Filled 2017-08-03: qty 1

## 2017-08-03 MED ORDER — SEVOFLURANE IN SOLN
RESPIRATORY_TRACT | Status: AC
  Filled 2017-08-03: qty 250

## 2017-08-03 MED ORDER — FAMOTIDINE 20 MG PO TABS
20.0000 mg | ORAL_TABLET | Freq: Once | ORAL | Status: AC
  Administered 2017-08-03: 20 mg via ORAL

## 2017-08-03 MED ORDER — FENTANYL CITRATE (PF) 100 MCG/2ML IJ SOLN: 25.0000 ug | INTRAMUSCULAR | Status: DC | PRN

## 2017-08-03 MED ORDER — PROMETHAZINE HCL 25 MG/ML IJ SOLN: 6.2500 mg | INTRAMUSCULAR | Status: DC | PRN

## 2017-08-03 MED ORDER — OXYCODONE HCL 5 MG PO TABS: 5.0000 mg | ORAL_TABLET | Freq: Once | ORAL | Status: DC | PRN

## 2017-08-03 MED ORDER — FENTANYL CITRATE (PF) 100 MCG/2ML IJ SOLN
INTRAMUSCULAR | Status: AC
  Filled 2017-08-03: qty 2

## 2017-08-03 MED ORDER — MIDAZOLAM HCL 2 MG/2ML IJ SOLN
INTRAMUSCULAR | Status: DC | PRN
  Administered 2017-08-03: 2 mg via INTRAVENOUS

## 2017-08-03 MED ORDER — OXYCODONE HCL 5 MG/5ML PO SOLN: 5.0000 mg | Freq: Once | ORAL | Status: DC | PRN

## 2017-08-03 MED ORDER — OXYCODONE-ACETAMINOPHEN 5-325 MG PO TABS: 1.0000 | ORAL_TABLET | ORAL | 0 refills | Status: DC | PRN

## 2017-08-03 MED ORDER — GLYCOPYRROLATE 0.2 MG/ML IJ SOLN
INTRAMUSCULAR | Status: DC | PRN
  Administered 2017-08-03: .2 mg via INTRAVENOUS

## 2017-08-03 MED ORDER — PROPOFOL 10 MG/ML IV BOLUS
INTRAVENOUS | Status: DC | PRN
  Administered 2017-08-03: 140 mg via INTRAVENOUS

## 2017-08-03 MED ORDER — DEXAMETHASONE SODIUM PHOSPHATE 10 MG/ML IJ SOLN
INTRAMUSCULAR | Status: DC | PRN
  Administered 2017-08-03: 10 mg via INTRAVENOUS

## 2017-08-03 MED ORDER — LIDOCAINE HCL (CARDIAC) 20 MG/ML IV SOLN
INTRAVENOUS | Status: DC | PRN
  Administered 2017-08-03: 60 mg via INTRAVENOUS

## 2017-08-03 SURGICAL SUPPLY — 23 items
ABLATOR ENDOMETRIAL MYOSURE (ABLATOR) ×3 IMPLANT
BAG COUNTER SPONGE EZ (MISCELLANEOUS) ×2 IMPLANT
CANISTER SUC SOCK COL 7IN (MISCELLANEOUS) ×3 IMPLANT
CATH ROBINSON RED A/P 16FR (CATHETERS) ×3 IMPLANT
COUNTER SPONGE BAG EZ (MISCELLANEOUS) ×1
DEVICE MYOSURE LITE (MISCELLANEOUS) ×3 IMPLANT
ELECT REM PT RETURN 9FT ADLT (ELECTROSURGICAL) ×3
ELECTRODE REM PT RTRN 9FT ADLT (ELECTROSURGICAL) ×1 IMPLANT
GLOVE BIO SURGEON STRL SZ8 (GLOVE) ×3 IMPLANT
GOWN STRL REUS W/ TWL LRG LVL3 (GOWN DISPOSABLE) ×1 IMPLANT
GOWN STRL REUS W/ TWL XL LVL3 (GOWN DISPOSABLE) ×1 IMPLANT
GOWN STRL REUS W/TWL LRG LVL3 (GOWN DISPOSABLE) ×2
GOWN STRL REUS W/TWL XL LVL3 (GOWN DISPOSABLE) ×2
PACK DNC HYST (MISCELLANEOUS) ×3 IMPLANT
PAD OB MATERNITY 4.3X12.25 (PERSONAL CARE ITEMS) ×3 IMPLANT
PAD PREP 24X41 OB/GYN DISP (PERSONAL CARE ITEMS) ×3 IMPLANT
SOL .9 NS 3000ML IRR  AL (IV SOLUTION) ×2
SOL .9 NS 3000ML IRR UROMATIC (IV SOLUTION) ×1 IMPLANT
STRAP SAFETY BODY (MISCELLANEOUS) ×3 IMPLANT
TOWEL OR 17X26 4PK STRL BLUE (TOWEL DISPOSABLE) ×3 IMPLANT
TUBING CONNECTING 10 (TUBING) ×2 IMPLANT
TUBING CONNECTING 10' (TUBING) ×1
TUBING HYSTEROSCOPY DOLPHIN (MISCELLANEOUS) ×3 IMPLANT

## 2017-08-03 NOTE — Anesthesia Post-op Follow-up Note (Signed)
Anesthesia QCDR form completed.        

## 2017-08-03 NOTE — Anesthesia Preprocedure Evaluation (Signed)
Anesthesia Evaluation  Patient identified by MRN, date of birth, ID band Patient awake    Reviewed: Allergy & Precautions, NPO status , Patient's Chart, lab work & pertinent test results  History of Anesthesia Complications Negative for: history of anesthetic complications  Airway Mallampati: III  TM Distance: >3 FB Neck ROM: Full    Dental no notable dental hx.    Pulmonary neg pulmonary ROS, neg sleep apnea, neg COPD,    breath sounds clear to auscultation- rhonchi (-) wheezing      Cardiovascular Exercise Tolerance: Good (-) hypertension(-) CAD, (-) Past MI and (-) Cardiac Stents  Rhythm:Regular Rate:Normal - Systolic murmurs and - Diastolic murmurs    Neuro/Psych  Headaches, negative psych ROS   GI/Hepatic negative GI ROS, Neg liver ROS,   Endo/Other  negative endocrine ROSneg diabetes  Renal/GU negative Renal ROS     Musculoskeletal negative musculoskeletal ROS (+)   Abdominal (+) - obese,   Peds  Hematology  (+) anemia ,   Anesthesia Other Findings Past Medical History: No date: Anemia 06/20/2016: Atypical squamous cell changes of undetermined  significance (ASCUS) on cervical cytology with negative high risk  human papilloma virus (HPV) test result March 2015 : Breast cancer (HCC)     Comment:  Clinical T1c, N0, BRCA negative No date: Breast mass No date: Dysplasia of cervix, low grade (CIN 1) 02/2014: Genetic testing of female     Comment:  CHEK2(09/22/16 no clinical significance) August 2015: Malignant neoplasm of upper-outer quadrant of female  breast (HCC)     Comment:  pT1b, N0; M0. ER-positive, PR-positive,HER-2/neu               overexpression No date: Menorrhagia No date: Migraines No date: Vitamin D deficiency   Reproductive/Obstetrics                             Anesthesia Physical Anesthesia Plan  ASA: II  Anesthesia Plan: General   Post-op Pain  Management:    Induction: Intravenous  PONV Risk Score and Plan: 2 and Ondansetron and Dexamethasone  Airway Management Planned: LMA  Additional Equipment:   Intra-op Plan:   Post-operative Plan:   Informed Consent: I have reviewed the patients History and Physical, chart, labs and discussed the procedure including the risks, benefits and alternatives for the proposed anesthesia with the patient or authorized representative who has indicated his/her understanding and acceptance.   Dental advisory given  Plan Discussed with: CRNA and Anesthesiologist  Anesthesia Plan Comments:         Anesthesia Quick Evaluation  

## 2017-08-03 NOTE — Anesthesia Procedure Notes (Signed)
Procedure Name: LMA Insertion Performed by: Ayriana Wix Pre-anesthesia Checklist: Patient identified, Patient being monitored, Timeout performed, Emergency Drugs available and Suction available Patient Re-evaluated:Patient Re-evaluated prior to induction Oxygen Delivery Method: Circle system utilized Preoxygenation: Pre-oxygenation with 100% oxygen Induction Type: IV induction Ventilation: Mask ventilation without difficulty LMA: LMA inserted LMA Size: 4.0 Tube type: Oral Number of attempts: 1 Placement Confirmation: positive ETCO2 and breath sounds checked- equal and bilateral Tube secured with: Tape Dental Injury: Teeth and Oropharynx as per pre-operative assessment        

## 2017-08-03 NOTE — OR Nursing (Signed)
Reviewed incentive spirometer with patient. Able to return the action comfortably.

## 2017-08-03 NOTE — Transfer of Care (Signed)
Immediate Anesthesia Transfer of Care Note  Patient: Sandra Nguyen  Procedure(s) Performed: Procedure(s): DILATATION & CURETTAGE/HYSTEROSCOPY WITH MYOSURE (N/A)  Patient Location: PACU  Anesthesia Type:General  Level of Consciousness: sedated  Airway & Oxygen Therapy: Patient Spontanous Breathing and Patient connected to face mask oxygen  Post-op Assessment: Report given to RN and Post -op Vital signs reviewed and stable  Post vital signs: Reviewed and stable  Last Vitals:  Vitals:   08/03/17 1413  BP: 113/68  Pulse: (!) 59  Resp: 14  Temp: (!) 36.2 C  SpO2: 100%    Last Pain:  Vitals:   08/03/17 1413  TempSrc: Tympanic  PainSc: 4       Patients Stated Pain Goal: 0 (37/94/32 7614)  Complications: No apparent anesthesia complications

## 2017-08-03 NOTE — Anesthesia Postprocedure Evaluation (Signed)
Anesthesia Post Note  Patient: Arlie L Cobb  Procedure(s) Performed: Procedure(s) (LRB): DILATATION & CURETTAGE/HYSTEROSCOPY WITH MYOSURE (N/A)  Patient location during evaluation: PACU Anesthesia Type: General Level of consciousness: awake and alert and oriented Pain management: pain level controlled Vital Signs Assessment: post-procedure vital signs reviewed and stable Respiratory status: spontaneous breathing, nonlabored ventilation and respiratory function stable Cardiovascular status: blood pressure returned to baseline and stable Postop Assessment: no signs of nausea or vomiting Anesthetic complications: no     Last Vitals:  Vitals:   08/03/17 1752 08/03/17 1807  BP: 114/73 114/75  Pulse: 85 72  Resp: 15 15  Temp: 36.8 C   SpO2: 100% 100%    Last Pain:  Vitals:   08/03/17 1752  TempSrc:   PainSc: Asleep                 Monica Zahler

## 2017-08-03 NOTE — H&P (Signed)
History and Physical Interval Note:  08/03/2017 4:01 PM  Sandra Nguyen  has presented today for surgery, with the diagnosis of ENDOMETRIAL THICKENING, IRREGULAR BLEEDING  The various methods of treatment have been discussed with the patient and family. After consideration of risks, benefits and other options for treatment, the patient has consented to  Procedure(s): [place] (N/A) as a surgical intervention .  The patient's history has been reviewed, patient examined, no change in status, stable for surgery.  Pt has the following beta blocker history-  Not taking Beta Blocker.  I have reviewed the patient's chart and labs.  Questions were answered to the patient's satisfaction.    Hoyt Koch

## 2017-08-03 NOTE — Op Note (Signed)
Operative Note  08/03/2017  PRE-OP DIAGNOSIS: Abnormal Endometrial Thickening by Ultrasound  POST-OP DIAGNOSIS: Same   SURGEON: Barnett Applebaum, MD, FACOG  PROCEDURE: Procedure(s): DILATATION & CURETTAGE/HYSTEROSCOPY WITH MYOSURE   ANESTHESIA: Choice   ESTIMATED BLOOD LOSS: Min, <10 mL  SPECIMENS: ECC and EMC  COMPLICATIONS: None  DISPOSITION: PACU - hemodynamically stable.  CONDITION: stable  FINDINGS: Exam under anesthesia revealed small, mobile  uterus with no masses and bilateral adnexa without masses or fullness. Hysteroscopy reveals no polyps or fibroids, normal appearing ostia.  PROCEDURE IN DETAIL: After informed consent was obtained, the patient was taken to the operating room where anesthesia was obtained without difficulty. The patient was positioned in the dorsal lithotomy position in [place]. The patient's bladder was catheterized with an in and out foley catheter. The patient was examined under anesthesia, with the above noted findings. The weighted speculum was placed inside the patient's vagina, and the the anterior lip of the cervix was seen and grasped with the tenaculum.  An endocervical curettage was performed using a kevorkian curette.  The uterine cavity was sounded to 8cm, and then the cervix was progressively dilated to a 18 French-Pratt dilator. The 30 degree hysteroscope was introduced, with sterile fluid used to distend the intrauterine cavity, with the above noted findings.  The hystersocope was removed and the uterine cavity was curetted until a gritty texture was noted, yielding endometrial curettings. Excellent hemostasis was noted, and all instruments were removed, with excellent hemostasis noted throughout. She was then taken out of dorsal lithotomy. Minimal discrepancy in fluid was noted.  The patient tolerated the procedure well. Sponge, lap and needle counts were correct x2. The patient was taken to recovery room in excellent condition.  Barnett Applebaum, MD, [facility], [facility] 08/03/2017  5:51 PM

## 2017-08-03 NOTE — Discharge Instructions (Signed)
AMBULATORY SURGERY  DISCHARGE INSTRUCTIONS   1) The drugs that you were given will stay in your system until tomorrow so for the next 24 hours you should not:  A) Drive an automobile B) Make any legal decisions C) Drink any alcoholic beverage   2) You may resume regular meals tomorrow.  Today it is better to start with liquids and gradually work up to solid foods.  You may eat anything you prefer, but it is better to start with liquids, then soup and crackers, and gradually work up to solid foods.   3) Please notify your doctor immediately if you have any unusual bleeding, trouble breathing, redness and pain at the surgery site, drainage, fever, or pain not relieved by medication.    4) Additional Instructions:        Please contact your physician with any problems or Same Day Surgery at [phone], Monday through Friday 6 am to 4 pm, or [place] at [facility] number at [phone].AMBULATORY SURGERY  DISCHARGE INSTRUCTIONS   5) The drugs that you were given will stay in your system until tomorrow so for the next 24 hours you should not:  D) Drive an automobile E) Make any legal decisions F) Drink any alcoholic beverage   6) You may resume regular meals tomorrow.  Today it is better to start with liquids and gradually work up to solid foods.  You may eat anything you prefer, but it is better to start with liquids, then soup and crackers, and gradually work up to solid foods.   7) Please notify your doctor immediately if you have any unusual bleeding, trouble breathing, redness and pain at the surgery site, drainage, fever, or pain not relieved by medication.    8) Additional Instructions:        Please contact your physician with any problems or Same Day Surgery at [phone], Monday through Friday 6 am to 4 pm, or [place] at [facility] number at [phone].General Gynecological Post-Operative Instructions You may expect to feel dizzy,  weak, and drowsy for as long as 24 hours after receiving the medicine that made you sleep (anesthetic).  Do not drive a car, ride a bicycle, participate in physical activities, or take public transportation until you are done taking narcotic pain medicines or as directed by your doctor.  Do not drink alcohol or take tranquilizers.  Do not take medicine that has not been prescribed by your doctor.  Do not sign important papers or make important decisions while on narcotic pain medicines.  Have a responsible person with you.  CARE OF INCISION  Keep incision clean and dry. Take showers instead of baths until your doctor gives you permission to take baths.  Avoid heavy lifting (more than 10 pounds/4.5 kilograms), pushing, or pulling.  Avoid activities that may risk injury to your surgical site.  No sexual intercourse or placement of anything in the vagina for 2 weeks or as instructed by your doctor. If you have tubes coming from the wound site, check with your doctor regarding appropriate care of the tubes. Only take prescription or over-the-counter medicines  for pain, discomfort, or fever as directed by your doctor. Do not take aspirin. It can make you bleed. Take medicines (antibiotics) that kill germs if they are prescribed for you.  Call the office or go to the ER if:  You feel sick to your stomach (nauseous) and you start to throw up (vomit).  You have trouble eating or drinking.  You have  an oral temperature above 101.  You have constipation that is not helped by adjusting diet or increasing fluid intake. Pain medicines are a common cause of constipation.  You have any other concerns. SEEK IMMEDIATE MEDICAL CARE IF:  You have persistent dizziness.  You have difficulty breathing or a congested sounding (croupy) cough.  You have an oral temperature above 102.5, not controlled by medicine.  There is increasing pain or tenderness near or in the surgical site.

## 2017-08-08 LAB — SURGICAL PATHOLOGY

## 2017-08-08 NOTE — Addendum Note (Signed)
Addendum  created 08/08/17 1032 by Demetrius Charity, CRNA   Anesthesia Intra Meds edited

## 2017-08-09 ENCOUNTER — Encounter: Payer: Self-pay | Admitting: Obstetrics & Gynecology

## 2017-08-09 ENCOUNTER — Encounter (HOSPITAL_COMMUNITY): Payer: Self-pay

## 2017-08-09 ENCOUNTER — Ambulatory Visit (INDEPENDENT_AMBULATORY_CARE_PROVIDER_SITE_OTHER): Payer: BC Managed Care – PPO | Admitting: Obstetrics & Gynecology

## 2017-08-09 VITALS — BP 100/60 | HR 87 | Ht 63.0 in | Wt 127.0 lb

## 2017-08-09 DIAGNOSIS — N950 Postmenopausal bleeding: Secondary | ICD-10-CM

## 2017-08-09 DIAGNOSIS — R102 Pelvic and perineal pain: Secondary | ICD-10-CM

## 2017-08-09 NOTE — Progress Notes (Signed)
  Postoperative Follow-up Patient presents post op from [facility] for Tamoxifen and VB (breast cancer), 1 week ago.  Subjective: Patient reports marked improvement in her preop symptoms. Eating a regular diet without difficulty. The patient is not having any pain.  Activity: normal activities of daily living. Patient reports vaginal sx's of None  Objective: BP 100/60   Pulse 87   Ht 5\' 3"  (1.6 m)   Wt 127 lb (57.6 kg)   BMI 22.50 kg/m  Physical Exam  Constitutional: She is oriented to person, place, and time. She appears well-developed and well-nourished. No distress.  Musculoskeletal: Normal range of motion.  Neurological: She is alert and oriented to person, place, and time.  Skin: Skin is warm and dry.  Psychiatric: She has a normal mood and affect.  Vitals reviewed.   Assessment: s/p :  D&C- Normal pathology stable  Plan: Patient has done well after surgery with no apparent complications.  I have discussed the post-operative course to date, and the expected progress moving forward.  The patient understands what complications to be concerned about.  I will see the patient in routine follow up, or sooner if needed.    Activity plan: No restriction.  Discussed reassuring results. Hysterectomy for pelvic pain if persists, she is considering that option. She is still on Tamoxifen for breast cancer.  Hoyt Koch 08/09/2017, 3:02 PM

## 2017-08-09 NOTE — Progress Notes (Signed)
Social worker provided counseling services to patient at the cancer center. 

## 2017-08-20 ENCOUNTER — Other Ambulatory Visit: Payer: Self-pay | Admitting: Internal Medicine

## 2017-08-23 ENCOUNTER — Encounter (HOSPITAL_COMMUNITY): Payer: Self-pay

## 2017-08-23 NOTE — Progress Notes (Signed)
Social worker met with patient and provided counseling services at the cancer center. 

## 2017-08-30 ENCOUNTER — Encounter (HOSPITAL_COMMUNITY): Payer: Self-pay

## 2017-08-30 NOTE — Progress Notes (Signed)
Social worker met with patient at the cancer center and provided counseling services. Next appointment scheduled for 09/13/17

## 2017-09-04 ENCOUNTER — Other Ambulatory Visit: Payer: Self-pay | Admitting: Internal Medicine

## 2017-09-13 ENCOUNTER — Encounter (HOSPITAL_COMMUNITY): Payer: Self-pay

## 2017-09-13 NOTE — Progress Notes (Signed)
Social worker provided counseling services to patient at the cancer center today.

## 2017-09-20 ENCOUNTER — Other Ambulatory Visit: Payer: Self-pay | Admitting: Internal Medicine

## 2017-09-25 ENCOUNTER — Encounter (HOSPITAL_COMMUNITY): Payer: Self-pay

## 2017-09-25 NOTE — Progress Notes (Unsigned)
Social worker met with patient and provided counseling services at the [employer]

## 2017-10-04 ENCOUNTER — Encounter (HOSPITAL_COMMUNITY): Payer: Self-pay

## 2017-10-04 NOTE — Progress Notes (Unsigned)
Social worker provided counseling services to patient at the cancer center. 

## 2017-10-12 ENCOUNTER — Encounter (HOSPITAL_COMMUNITY): Payer: Self-pay

## 2017-10-12 NOTE — Progress Notes (Unsigned)
Social worker provided counseling services to patient at the cancer center. 

## 2017-10-18 ENCOUNTER — Encounter (HOSPITAL_COMMUNITY): Payer: Self-pay

## 2017-10-18 NOTE — Progress Notes (Unsigned)
Social worker provided counseling services to patient at the Cancer [facility].

## 2017-10-24 ENCOUNTER — Encounter (HOSPITAL_COMMUNITY): Payer: Self-pay

## 2017-10-24 NOTE — Progress Notes (Unsigned)
Social worker provided counseling services to patient at the cancer center. 

## 2017-11-15 ENCOUNTER — Encounter (HOSPITAL_COMMUNITY): Payer: Self-pay

## 2017-11-15 NOTE — Progress Notes (Signed)
Social worker met with patient at the cancer center and provided counseling services. 

## 2017-12-01 ENCOUNTER — Inpatient Hospital Stay: Payer: BC Managed Care – PPO

## 2017-12-01 ENCOUNTER — Inpatient Hospital Stay: Payer: BC Managed Care – PPO | Admitting: Nurse Practitioner

## 2017-12-11 ENCOUNTER — Inpatient Hospital Stay: Payer: BC Managed Care – PPO | Attending: Nurse Practitioner | Admitting: Internal Medicine

## 2017-12-11 ENCOUNTER — Ambulatory Visit
Admission: RE | Admit: 2017-12-11 | Discharge: 2017-12-11 | Disposition: A | Discharge status: Home / Self Care | Payer: BC Managed Care – PPO | Source: Ambulatory Visit | Attending: Internal Medicine | Admitting: Internal Medicine

## 2017-12-11 ENCOUNTER — Inpatient Hospital Stay: Payer: BC Managed Care – PPO

## 2017-12-11 VITALS — BP 137/81 | HR 61 | Temp 97.6°F | Resp 20 | Ht 63.0 in | Wt 127.4 lb

## 2017-12-11 DIAGNOSIS — N644 Mastodynia: Secondary | ICD-10-CM

## 2017-12-11 DIAGNOSIS — Z170 Estrogen receptor positive status [ER+]: Secondary | ICD-10-CM | POA: Diagnosis not present

## 2017-12-11 DIAGNOSIS — R51 Headache: Secondary | ICD-10-CM | POA: Insufficient documentation

## 2017-12-11 DIAGNOSIS — C50411 Malignant neoplasm of upper-outer quadrant of right female breast: Secondary | ICD-10-CM

## 2017-12-11 DIAGNOSIS — R519 Headache, unspecified: Secondary | ICD-10-CM

## 2017-12-11 DIAGNOSIS — Z79810 Long term (current) use of selective estrogen receptor modulators (SERMs): Secondary | ICD-10-CM | POA: Insufficient documentation

## 2017-12-11 DIAGNOSIS — Z9011 Acquired absence of right breast and nipple: Secondary | ICD-10-CM | POA: Insufficient documentation

## 2017-12-11 LAB — CBC WITH DIFFERENTIAL/PLATELET
BASOS ABS: 0 10*3/uL (ref 0–0.1)
Basophils Relative: 1 %
EOS ABS: 0.1 10*3/uL (ref 0–0.7)
EOS PCT: 1 %
HCT: 35.5 % (ref 35.0–47.0)
Hemoglobin: 11.6 g/dL — ABNORMAL LOW (ref 12.0–16.0)
LYMPHS ABS: 2.3 10*3/uL (ref 1.0–3.6)
Lymphocytes Relative: 36 %
MCH: 28 pg (ref 26.0–34.0)
MCHC: 32.7 g/dL (ref 32.0–36.0)
MCV: 85.5 fL (ref 80.0–100.0)
Monocytes Absolute: 0.4 10*3/uL (ref 0.2–0.9)
Monocytes Relative: 6 %
Neutro Abs: 3.5 10*3/uL (ref 1.4–6.5)
Neutrophils Relative %: 56 %
PLATELETS: 204 10*3/uL (ref 150–440)
RBC: 4.16 MIL/uL (ref 3.80–5.20)
RDW: 13.8 % (ref 11.5–14.5)
WBC: 6.3 10*3/uL (ref 3.6–11.0)

## 2017-12-11 LAB — COMPREHENSIVE METABOLIC PANEL
ALT: 10 U/L — AB (ref 14–54)
AST: 23 U/L (ref 15–41)
Albumin: 3.9 g/dL (ref 3.5–5.0)
Alkaline Phosphatase: 55 U/L (ref 38–126)
Anion gap: 7 (ref 5–15)
BUN: 18 mg/dL (ref 6–20)
CO2: 26 mmol/L (ref 22–32)
CREATININE: 0.99 mg/dL (ref 0.44–1.00)
Calcium: 8.9 mg/dL (ref 8.9–10.3)
Chloride: 104 mmol/L (ref 101–111)
GFR calc Af Amer: >60 mL/min (ref >60)
Glucose, Bld: 121 mg/dL — ABNORMAL HIGH (ref 65–99)
Potassium: 3.6 mmol/L (ref 3.5–5.1)
Sodium: 137 mmol/L (ref 135–145)
TOTAL PROTEIN: 7.1 g/dL (ref 6.5–8.1)
Total Bilirubin: 0.4 mg/dL (ref 0.3–1.2)

## 2017-12-11 MED ORDER — GADOBENATE DIMEGLUMINE 529 MG/ML IV SOLN
10.0000 mL | Freq: Once | INTRAVENOUS | Status: AC | PRN
  Administered 2017-12-11: 10 mL via INTRAVENOUS

## 2017-12-11 MED ORDER — TAMOXIFEN CITRATE 20 MG PO TABS: 20.0000 mg | ORAL_TABLET | Freq: Every day | ORAL | 11 refills | Status: DC

## 2017-12-11 NOTE — Assessment & Plan Note (Addendum)
#   STAGE II ER/PR/; tolerating chemo; Her 2 Neu POSITIVE. Clinically NED. Recent mammogram March 2018 NED. Continue tamoxifen- tolerating with mild- moderate side effects [see discussion below].    # myalgias/ arthritis- sec to Tamoxifen. Continue taking supplements chrondroitin/ glucosamine/ vit D 50K/week/exercise.   # Bilateral breast tenderness-physical exam no evidence of any new lumps or bumps.? Order bil diagnostic mamm asap;   # Headaches- 1 month- frontal bil; ? unsteady ? MRI stat.   # follow up TBD;  will call with results- [phone]  Addendum:  MRI brain is negative; will await mammogram for now. Pt will be informed.   Ask her to STOP tamoxifen to see if her symptoms get better; and schedule follow up with me in 4 weeks/no labs.

## 2017-12-11 NOTE — Progress Notes (Signed)
[place] OFFICE PROGRESS NOTE  Patient Care Team: [name], MD as PCP - General (Family Medicine) Dear, [name], MD (Inactive) (Family Medicine) Bary Castilla, Forest Gleason, MD (General Surgery) Forest Gleason, MD (Unknown Physician Specialty) Thornton Park, MD as Consulting Physician (Orthopedic Surgery)  Cancer Staging Carcinoma of upper-outer quadrant of right breast in female, estrogen receptor positive [facility]) Staging form: Breast, AJCC 7th Edition - Clinical: Stage IIA (T2, N0, M0) - Signed by Forest Gleason, MD on 05/09/2015    Oncology History   1. Carcinoma breast (right breast) 2 cm palpable mass Invasive mammary carcinoma.  Estrogen receptor positive.  Progesterone receptor positive.  HER-2/neu receptor amplified. 2. Started on neoadjuvant chemotherapy,  Taxotere, carboplatin and Herceptin and perjeta (March 05, 2014) 3. Patient does not have any clinically significant BRCA mutation Has been on and to [place] Mutation of unknown significance. . 4. 4 cycles of chemotherapy with TCH,perjeta previous did not June of 201 5.  Because of progressing side effect remaining 2 cycles of chemotherapy was not given but patient continued Herceptin and aperjeta  5. Status post right breast simple mastectomy in August of 2015 ypT1  ypN0 stage IB 6.patient started on tamoxifen in October of 2015 along with maintenance Herceptin therapy Patient is going for reconstructive surgery on October 21, 2014 final reconstructive surgery was done in February of 2016 [name] finished Herceptin therapy on March 11, 2015     Carcinoma of upper-outer quadrant of right breast in female, estrogen receptor positive ([place])   02/18/2014 Initial Diagnosis    Breast cancer of upper-outer quadrant of right female breast        INTERVAL HISTORY:  Sandra Nguyen 47 y.o.  female pleasant patient above history of right-sided breast cancer stage II ER/PR positive HER-2/neu positive status post mastectomy  is here for follow-up. Patient is tamoxifen.  Patient noted to have intermittent headaches over the last month or so.  Progressive getting worse.  Also noted to have gait instability.  Denies any vision problems.  She also noted to have bilateral breast pain especially with deep palpation.    No lumps or bumps noted. Denies any bone pain. She continues to have joint pains; intermittent hot flashes.   REVIEW OF SYSTEMS:  A complete 10 point review of system is done which is negative except mentioned above/history of present illness.   PAST MEDICAL HISTORY :  Past Medical History:  Diagnosis Date  . Anemia   . Atypical squamous cell changes of undetermined significance (ASCUS) on cervical cytology with negative high risk human papilloma virus (HPV) test result 06/20/2016  . Breast cancer [facility]) March 2015    Clinical T1c, N0, BRCA negative  . Breast mass   . Dysplasia of cervix, low grade (CIN 1)   . 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 Malignant neoplasm of upper-outer quadrant of female breast [facility]) August 2015   pT1b, N0; M0. 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 Menorrhagia   . Migraines   . Vitamin D deficiency     PAST SURGICAL HISTORY :   Past Surgical History:  Procedure Laterality Date  . AUGMENTATION MAMMAPLASTY Bilateral Nov 2016   Dr Tula Nakayama REMOVED 07/2016  . BREAST BIOPSY Right 02/2014   positive  . BREAST RECONSTRUCTION Right 04/14/2015   Procedure: AEROLA/NIPPLE RECONSTRUCTION WITH GRAFT;  Surgeon: Nicholaus Bloom, MD;  Location: [place];  Service: Plastics;  Laterality: Right;  . BREAST SURGERY Right July 10, 2014  Right simple mastectomy with sentinel node biopsy.  . CERVICAL BIOPSY  W/ LOOP ELECTRODE EXCISION  1998  . COLONOSCOPY WITH PROPOFOL N/A 05/04/2016   Procedure: COLONOSCOPY WITH PROPOFOL;  Surgeon: Robert Bellow, MD;  [facility] END[facility]COPY;  Service: Endoscopy;  Laterality: N/A;  .  DILATATION & CURETTAGE/HYSTEROSCOPY WITH MYOSURE N/A 08/03/2017   Procedure: DILATATION & CURETTAGE/HYSTEROSCOPY WITH MYOSURE;  Surgeon: Gae Dry, MD;  Location: [facility] ORS;  Service: Gynecology;  Laterality: N/A;  . ESSURE TUBAL LIGATION  09/15/2010  . EXCISIONAL HEMORRHOIDECTOMY  2015  . I and D of hemorrhoid  05/2017   Dr. Jamal Collin  . MASTECTOMY Right 07/2014   chemo and herceptin tx  . TUBAL LIGATION      FAMILY HISTORY :   Family History  Problem Relation Age of Onset  . Breast cancer Maternal Aunt 70  . Cervical cancer Maternal Aunt 65  . Hypertension Mother   . Heart disease Maternal Grandmother   . Ovarian cancer Neg Hx   . Colon cancer Neg Hx     SOCIAL HISTORY:   Social History   Tobacco Use  . Smoking status: Never Smoker  . Smokeless tobacco: Never Used  Substance Use Topics  . Alcohol use: No  . Drug use: No    ALLERGIES:  is allergic to keflex [cephalexin].  MEDICATIONS:  Current Outpatient Medications  Medication Sig Dispense Refill  . Biotin (BIOTIN 5000) 5 MG CAPS Take 1 capsule by mouth daily.    . clonazePAM (KLONOPIN) 0.5 MG tablet Take 0.5 mg by mouth at bedtime as needed for anxiety.    Marland Kitchen levocetirizine (XYZAL) 5 MG tablet     . tamoxifen (NOLVADEX) 20 MG tablet Take 1 tablet (20 mg total) by mouth daily. 30 tablet 11  . venlafaxine XR (EFFEXOR-XR) 37.5 MG 24 hr capsule Take 3 capsules by mouth daily.    . Vitamin D, Ergocalciferol, (DRISDOL) 50000 units CAPS capsule TAKE 1 CAPSULE (50,000 UNITS TOTAL) BY MOUTH ONCE A WEEK. 12 capsule 1   No current facility-administered medications for this visit.     PHYSICAL EXAMINATION: ECOG PERFORMANCE STATUS: 0 - Asymptomatic  BP 137/81 (Patient Position: Sitting)   Pulse 61   Temp 97.6 F (36.4 C) (Tympanic)   Resp 20   Ht '5\' 3"'  (1.6 m)   Wt 127 lb 6.4 oz (57.8 kg)   BMI 22.57 kg/m   Filed Weights   12/11/17 1046  Weight: 127 lb 6.4 oz (57.8 kg)    GENERAL: Well-nourished well-developed;  Alert, no distress and comfortable.   Alone.  EYES: no pallor or icterus OROPHARYNX: no thrush or ulceration; good dentition  NECK: supple, no masses felt LYMPH:  no palpable lymphadenopathy in the cervical, axillary or inguinal regions LUNGS: clear to auscultation and  No wheeze or crackles HEART/CVS: regular rate & rhythm and no murmurs; No lower extremity edema ABDOMEN:abdomen soft, non-tender and normal bowel sounds Musculoskeletal:no cyanosis of digits and no clubbing  PSYCH: alert & oriented x 3 with fluent speech NEURO: no focal motor/sensory deficits SKIN:  no rashes or significant lesion; Right and left BREAST exam [in the presence of nurse]- no unusual skin changes or dominant masses felt. Surgical scars noted.     LABORATORY DATA:  I have reviewed the data as listed    Component Value Date/Time   NA 137 12/11/2017 0928   NA 143 12/31/2014 1313   K 3.6 12/11/2017 0928   K 3.5 12/31/2014 1313   CL 104  12/11/2017 0928   CL 110 (H) 12/31/2014 1313   CO2 26 12/11/2017 0928   CO2 28 12/31/2014 1313   GLUCOSE 121 (H) 12/11/2017 0928   GLUCOSE 124 (H) 12/31/2014 1313   BUN 18 12/11/2017 0928   BUN 13 12/31/2014 1313   CREATININE 0.99 12/11/2017 0928   CREATININE 0.89 12/31/2014 1313   CALCIUM 8.9 12/11/2017 0928   CALCIUM 8.4 (L) 12/31/2014 1313   PROT 7.1 12/11/2017 0928   PROT 6.9 12/31/2014 1313   ALBUMIN 3.9 12/11/2017 0928   ALBUMIN 3.5 12/31/2014 1313   AST 23 12/11/2017 0928   AST 21 12/31/2014 1313   ALT 10 (L) 12/11/2017 0928   ALT 23 12/31/2014 1313   ALKPHOS 55 12/11/2017 0928   ALKPHOS 66 12/31/2014 1313   BILITOT 0.4 12/11/2017 0928   BILITOT 0.2 12/31/2014 1313   GFRNONAA >60 12/11/2017 0928   GFRNONAA >60 12/31/2014 1313   GFRNONAA >60 07/16/2014 1351   GFRAA >60 12/11/2017 0928   GFRAA >60 12/31/2014 1313   GFRAA >60 07/16/2014 1351    No results found for: SPEP, UPEP  Lab Results  Component Value Date   WBC 6.3 12/11/2017   NEUTROABS 3.5  12/11/2017   HGB 11.6 (L) 12/11/2017   HCT 35.5 12/11/2017   MCV 85.5 12/11/2017   PLT 204 12/11/2017      Chemistry      Component Value Date/Time   NA 137 12/11/2017 0928   NA 143 12/31/2014 1313   K 3.6 12/11/2017 0928   K 3.5 12/31/2014 1313   CL 104 12/11/2017 0928   CL 110 (H) 12/31/2014 1313   CO2 26 12/11/2017 0928   CO2 28 12/31/2014 1313   BUN 18 12/11/2017 0928   BUN 13 12/31/2014 1313   CREATININE 0.99 12/11/2017 0928   CREATININE 0.89 12/31/2014 1313      Component Value Date/Time   CALCIUM 8.9 12/11/2017 0928   CALCIUM 8.4 (L) 12/31/2014 1313   ALKPHOS 55 12/11/2017 0928   ALKPHOS 66 12/31/2014 1313   AST 23 12/11/2017 0928   AST 21 12/31/2014 1313   ALT 10 (L) 12/11/2017 0928   ALT 23 12/31/2014 1313   BILITOT 0.4 12/11/2017 0928   BILITOT 0.2 12/31/2014 1313       RADIOGRAPHIC STUDIES: I have personally reviewed the radiological images as listed and agreed with the findings in the report. Mr Jeri Cos Wo Contrast  Result Date: 12/11/2017 CLINICAL DATA:  Carcinoma of the upper-outer quadrant of the right breast. Staging. EXAM: MRI HEAD WITHOUT AND WITH CONTRAST TECHNIQUE: Multiplanar, multiecho pulse sequences of the brain and surrounding structures were obtained without and with intravenous contrast. CONTRAST:  52m MULTIHANCE GADOBENATE DIMEGLUMINE 529 MG/ML IV SOLN COMPARISON:  None. FINDINGS: Brain: Diffusion imaging does not show any acute or subacute infarction. The brain appears normally formed. No abnormality of the brainstem or cerebellum. Cerebral hemispheres are normal except for a punctate focus of T2 and FLAIR signal in the left frontal white matter, not significant as an isolated finding. No abnormal enhancement. No evidence of mass lesion, hemorrhage, hydrocephalus or extra-axial collection. Vascular: Major vessels at the base of the brain show flow. Skull and upper cervical spine: Negative Sinuses/Orbits: Clear/normal Other: None IMPRESSION: No  evidence of metastatic disease. Normal MRI of the brain, with the exception of a punctate focus of T2 signal in the left frontal white matter, not significant. Electronically Signed   By: MNelson ChimesM.D.   On: 12/11/2017 13:26  ASSESSMENT & PLAN:  Carcinoma of upper-outer quadrant of right breast in female, estrogen receptor positive ([place]) # STAGE II ER/PR/; tolerating chemo; Her 2 Neu POSITIVE. Clinically NED. Recent mammogram March 2018 NED. Continue tamoxifen- tolerating with mild- moderate side effects [see discussion below].    # myalgias/ arthritis- sec to Tamoxifen. Continue taking supplements chrondroitin/ glucosamine/ vit D 50K/week/exercise.   # Bilateral breast tenderness- ? Order bil diagnostic mamm asap;   # Headaches- 1 month- frontal bil; ? unsteady ? MRI stat.   # follow up TBD;  will call with results- [phone]  Addendum:  MRI brain is negative; will await mammogram for now. Pt will be informed.   Ask her to STOP tamoxifen to see if her symptoms get better; and schedule follow up with me in 4 weeks/no labs.    Orders Placed This Encounter  Procedures  . MR Brain W Wo Contrast    Standing Status:   Future    Number of Occurrences:   1    Standing Expiration Date:   12/11/2018    Order Specific Question:   If indicated for the ordered procedure, I authorize the administration of contrast media per Radiology protocol    Answer:   Yes    Order Specific Question:   What is the patient's sedation requirement?    Answer:   No Sedation    Order Specific Question:   Does the patient have a pacemaker or implanted devices?    Answer:   Yes    Order Specific Question:   Radiology Contrast Protocol - do NOT remove file path    Answer:   [email or web address]    Order Specific Question:   Reason for Exam additional comments    Answer:   recurrent headaches; Hx of breast cancer    Order Specific Question:   Preferred imaging location?     Answer:   [facility] (table limit-300lbs)  . US Breast Limited Uni Left Inc Axilla    Standing Status:   Future    Standing Expiration Date:   02/09/2019    Order Specific Question:   Reason for Exam (SYMPTOM  OR DIAGNOSIS REQUIRED)    Answer:   bilateral breast pain; h/o right breast cancer    Order Specific Question:   Preferred imaging location?    Answer:   [facility]  . US Breast Limited Uni Right Inc Axilla    Standing Status:   Future    Standing Expiration Date:   02/09/2019    Order Specific Question:   Reason for Exam (SYMPTOM  OR DIAGNOSIS REQUIRED)    Answer:   h/o right breast cancer; bilateral breast pain    Order Specific Question:   Preferred imaging location?    Answer:   [facility]  . MM DIAG BREAST W/IMPLANT TOMO BILATERAL    BREAST PAIN 12-6 OCLOCK ON BOTH BREASTS    Standing Status:   Future    Standing Expiration Date:   02/09/2019    Order Specific Question:   Reason for Exam (SYMPTOM  OR DIAGNOSIS REQUIRED)    Answer:   bilateral breast pain; h/o right breast cancer    Order Specific Question:   Is the patient pregnant?    Answer:   No    Order Specific Question:   Preferred imaging location?    Answer:   [facility]      Cammie Sickle, MD 12/12/2017 12:57 PM

## 2017-12-11 NOTE — Progress Notes (Signed)
Patient here for follow-up for breast cancer. Patient c/o bilateral breast tenderness.

## 2017-12-18 ENCOUNTER — Other Ambulatory Visit: Payer: BC Managed Care – PPO

## 2017-12-18 ENCOUNTER — Ambulatory Visit: Payer: BC Managed Care – PPO

## 2017-12-20 ENCOUNTER — Encounter (HOSPITAL_COMMUNITY): Payer: Self-pay

## 2017-12-20 NOTE — Progress Notes (Signed)
Social worker provided counseling services to patient at the cancer center. 

## 2017-12-25 ENCOUNTER — Ambulatory Visit
Admission: RE | Admit: 2017-12-25 | Discharge: 2017-12-25 | Disposition: A | Discharge status: Home / Self Care | Payer: BC Managed Care – PPO | Source: Ambulatory Visit | Attending: Internal Medicine | Admitting: Internal Medicine

## 2017-12-25 ENCOUNTER — Other Ambulatory Visit: Payer: Self-pay | Admitting: Internal Medicine

## 2017-12-25 DIAGNOSIS — Z9011 Acquired absence of right breast and nipple: Secondary | ICD-10-CM | POA: Insufficient documentation

## 2017-12-25 DIAGNOSIS — C50411 Malignant neoplasm of upper-outer quadrant of right female breast: Secondary | ICD-10-CM

## 2017-12-25 DIAGNOSIS — N644 Mastodynia: Secondary | ICD-10-CM

## 2017-12-25 DIAGNOSIS — Z170 Estrogen receptor positive status [ER+]: Principal | ICD-10-CM

## 2017-12-25 DIAGNOSIS — Z853 Personal history of malignant neoplasm of breast: Secondary | ICD-10-CM | POA: Insufficient documentation

## 2017-12-27 ENCOUNTER — Telehealth: Payer: Self-pay | Admitting: *Deleted

## 2017-12-27 NOTE — Telephone Encounter (Signed)
Patient called reporting that Jerene Pitch had called her regarding results and she is returning her call. I spoke with Sandra Nguyen and she states there is a result note on the mammo. I returned call to patient and got her VOICE MAIL and left message that mammo was normal and that if her pain persists, we would do further imaging and to call back If she feels this is needed.

## 2018-01-03 ENCOUNTER — Other Ambulatory Visit: Payer: Self-pay

## 2018-01-03 DIAGNOSIS — Z1231 Encounter for screening mammogram for malignant neoplasm of breast: Secondary | ICD-10-CM

## 2018-01-08 ENCOUNTER — Encounter: Payer: Self-pay | Admitting: *Deleted

## 2018-01-08 ENCOUNTER — Other Ambulatory Visit: Payer: Self-pay

## 2018-01-08 ENCOUNTER — Inpatient Hospital Stay: Payer: BC Managed Care – PPO | Attending: Internal Medicine | Admitting: Internal Medicine

## 2018-01-08 VITALS — BP 102/71 | HR 72 | Temp 97.9°F | Resp 20

## 2018-01-08 DIAGNOSIS — E559 Vitamin D deficiency, unspecified: Secondary | ICD-10-CM | POA: Diagnosis not present

## 2018-01-08 DIAGNOSIS — R51 Headache: Secondary | ICD-10-CM | POA: Diagnosis not present

## 2018-01-08 DIAGNOSIS — N920 Excessive and frequent menstruation with regular cycle: Secondary | ICD-10-CM | POA: Diagnosis not present

## 2018-01-08 DIAGNOSIS — Z170 Estrogen receptor positive status [ER+]: Secondary | ICD-10-CM | POA: Diagnosis not present

## 2018-01-08 DIAGNOSIS — Z79899 Other long term (current) drug therapy: Secondary | ICD-10-CM | POA: Diagnosis not present

## 2018-01-08 DIAGNOSIS — Z9221 Personal history of antineoplastic chemotherapy: Secondary | ICD-10-CM | POA: Diagnosis not present

## 2018-01-08 DIAGNOSIS — C50411 Malignant neoplasm of upper-outer quadrant of right female breast: Secondary | ICD-10-CM | POA: Diagnosis not present

## 2018-01-08 DIAGNOSIS — N644 Mastodynia: Secondary | ICD-10-CM | POA: Insufficient documentation

## 2018-01-08 DIAGNOSIS — Z9011 Acquired absence of right breast and nipple: Secondary | ICD-10-CM | POA: Diagnosis not present

## 2018-01-08 NOTE — Assessment & Plan Note (Addendum)
#   STAGE II ER/PR/; s/p-Her 2 Neu POSITIVE. Clinically NED. Recent mammogram [left breast] jan 2019-NED.   #As the patient's [headache/breast pain] not improved since coming off tamoxifen; this is not related to tamoxifen.  Recommend starting back on tamoxifen.  #Menorrhagia-question worse on tamoxifen; recommend restarting back.  If worse recommend follow-up with gynecology.  #Left breast tenderness physical exam no evidence of any new lumps or bumps.  Left breast mammogram; normal.  If worse/not improved recommend let us know-we will refer to Dr. Charissa Bash.  #Intermittent headaches-not improved.  Recommend neurology referral.  # follow up in 3 months.

## 2018-01-08 NOTE — Progress Notes (Signed)
[place] OFFICE PROGRESS NOTE  Patient Care Team: [name], MD as PCP - General (Family Medicine) Dear, [name], MD (Inactive) (Family Medicine) Bary Castilla, Forest Gleason, MD (General Surgery) Forest Gleason, MD (Unknown Physician Specialty) Thornton Park, MD as Consulting Physician (Orthopedic Surgery)  Cancer Staging Carcinoma of upper-outer quadrant of right breast in female, estrogen receptor positive [facility]) Staging form: Breast, AJCC 7th Edition - Clinical: Stage IIA (T2, N0, M0) - Signed by Forest Gleason, MD on 05/09/2015    Oncology History   1. Carcinoma breast (right breast) 2 cm palpable mass Invasive mammary carcinoma.  Estrogen receptor positive.  Progesterone receptor positive.  HER-2/neu receptor amplified. 2. Started on neoadjuvant chemotherapy,  Taxotere, carboplatin and Herceptin and perjeta (March 05, 2014) 3. Patient does not have any clinically significant BRCA mutation Has been on and to [place] Mutation of unknown significance. . 4. 4 cycles of chemotherapy with TCH,perjeta previous did not June of 201 5.  Because of progressing side effect remaining 2 cycles of chemotherapy was not given but patient continued Herceptin and aperjeta  5. Status post right breast simple mastectomy in August of 2015 ypT1  ypN0 stage IB 6.patient started on tamoxifen in October of 2015 along with maintenance Herceptin therapy Patient is going for reconstructive surgery on October 21, 2014 final reconstructive surgery was done in February of 2016 [name] finished Herceptin therapy on March 11, 2015     Carcinoma of upper-outer quadrant of right breast in female, estrogen receptor positive ([place])   02/18/2014 Initial Diagnosis    Breast cancer of upper-outer quadrant of right female breast        INTERVAL HISTORY:  Sandra Nguyen 47 y.o.  female pleasant patient above history of right-sided breast cancer stage II ER/PR positive HER-2/neu positive status post mastectomy  is here for follow-up.  Patient's tamoxifen was held at last visit approximately 4 weeks ago-because of multitude of problems including-intermittent headaches; also left-sided breast pain.  Patient MRI brain negative for any metastasis.  Left mammogram ultrasound negative for any unusual lumps or bumps.  Left mastectomy site normal.  Patient however noted increase in her menstrual flow since coming off tamoxifen. No lumps or bumps noted. Denies any bone pain. She continues to have joint pains; intermittent hot flashes.   REVIEW OF SYSTEMS:  A complete 10 point review of system is done which is negative except mentioned above/history of present illness.   PAST MEDICAL HISTORY :  Past Medical History:  Diagnosis Date  . Anemia   . Atypical squamous cell changes of undetermined significance (ASCUS) on cervical cytology with negative high risk human papilloma virus (HPV) test result 06/20/2016  . Breast cancer [facility]) March 2015    Clinical T1c, N0, BRCA negative- Right  . Dysplasia of cervix, low grade (CIN 1)   . 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 Malignant neoplasm of upper-outer quadrant of female breast [facility]) August 2015   pT1b, N0; M0. 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 Menorrhagia   . Migraines   . Vitamin D deficiency     PAST SURGICAL HISTORY :   Past Surgical History:  Procedure Laterality Date  . AUGMENTATION MAMMAPLASTY Bilateral Nov 2016   Dr Tula Nakayama REMOVED 07/2016  . BREAST BIOPSY Right 02/2014   positive  . BREAST RECONSTRUCTION Right 04/14/2015   Procedure: AEROLA/NIPPLE RECONSTRUCTION WITH GRAFT;  Surgeon: Nicholaus Bloom, MD;  Location: [place];  Service: Plastics;  Laterality: Right;  .  BREAST SURGERY Right July 10, 2014   Right simple mastectomy with sentinel node biopsy.  . CERVICAL BIOPSY  W/ LOOP ELECTRODE EXCISION  1998  . COLONOSCOPY WITH PROPOFOL N/A 05/04/2016   Procedure: COLONOSCOPY WITH  PROPOFOL;  Surgeon: Robert Bellow, MD;  Location: [facility] ENDOSCOPY;  Service: Endoscopy;  Laterality: N/A;  . DILATATION & CURETTAGE/HYSTEROSCOPY WITH MYOSURE N/A 08/03/2017   Procedure: DILATATION & CURETTAGE/HYSTEROSCOPY WITH MYOSURE;  Surgeon: Gae Dry, MD;  Location: [facility] ORS;  Service: Gynecology;  Laterality: N/A;  . ESSURE TUBAL LIGATION  09/15/2010  . EXCISIONAL HEMORRHOIDECTOMY  2015  . I and D of hemorrhoid  05/2017   Dr. Jamal Collin  . MASTECTOMY Right 07/2014   chemo and herceptin tx  . TUBAL LIGATION      FAMILY HISTORY :   Family History  Problem Relation Age of Onset  . Cervical cancer Maternal Aunt 65  . Hypertension Mother   . Heart disease Maternal Grandmother   . Breast cancer Paternal Aunt 49  . Ovarian cancer Neg Hx   . Colon cancer Neg Hx     SOCIAL HISTORY:   Social History   Tobacco Use  . Smoking status: Never Smoker  . Smokeless tobacco: Never Used  Substance Use Topics  . Alcohol use: No  . Drug use: No    ALLERGIES:  is allergic to keflex [cephalexin].  MEDICATIONS:  Current Outpatient Medications  Medication Sig Dispense Refill  . Biotin (BIOTIN 5000) 5 MG CAPS Take 1 capsule by mouth daily.    . clonazePAM (KLONOPIN) 0.5 MG tablet Take 0.5 mg by mouth at bedtime as needed for anxiety.    Marland Kitchen levocetirizine (XYZAL) 5 MG tablet Take 5 mg by mouth every evening.     . tamoxifen (NOLVADEX) 20 MG tablet Take 1 tablet (20 mg total) by mouth daily. 30 tablet 11  . venlafaxine XR (EFFEXOR-XR) 37.5 MG 24 hr capsule Take 3 capsules by mouth daily.    . Vitamin D, Ergocalciferol, (DRISDOL) 50000 units CAPS capsule TAKE 1 CAPSULE (50,000 UNITS TOTAL) BY MOUTH ONCE A WEEK. 12 capsule 1   No current facility-administered medications for this visit.     PHYSICAL EXAMINATION: ECOG PERFORMANCE STATUS: 0 - Asymptomatic  BP 102/71   Pulse 72   Temp 97.9 F (36.6 C) (Tympanic)   Resp 20   There were no vitals filed for this visit.  GENERAL:  Well-nourished well-developed; Alert, no distress and comfortable.   Alone.  EYES: no pallor or icterus OROPHARYNX: no thrush or ulceration; good dentition  NECK: supple, no masses felt LYMPH:  no palpable lymphadenopathy in the cervical, axillary or inguinal regions LUNGS: clear to auscultation and  No wheeze or crackles HEART/CVS: regular rate & rhythm and no murmurs; No lower extremity edema ABDOMEN:abdomen soft, non-tender and normal bowel sounds Musculoskeletal:no cyanosis of digits and no clubbing  PSYCH: alert & oriented x 3 with fluent speech NEURO: no focal motor/sensory deficits      LABORATORY DATA:  I have reviewed the data as listed    Component Value Date/Time   NA 137 12/11/2017 0928   NA 143 12/31/2014 1313   K 3.6 12/11/2017 0928   K 3.5 12/31/2014 1313   CL 104 12/11/2017 0928   CL 110 (H) 12/31/2014 1313   CO2 26 12/11/2017 0928   CO2 28 12/31/2014 1313   GLUCOSE 121 (H) 12/11/2017 0928   GLUCOSE 124 (H) 12/31/2014 1313   BUN 18 12/11/2017 0928   BUN  13 12/31/2014 1313   CREATININE 0.99 12/11/2017 0928   CREATININE 0.89 12/31/2014 1313   CALCIUM 8.9 12/11/2017 0928   CALCIUM 8.4 (L) 12/31/2014 1313   PROT 7.1 12/11/2017 0928   PROT 6.9 12/31/2014 1313   ALBUMIN 3.9 12/11/2017 0928   ALBUMIN 3.5 12/31/2014 1313   AST 23 12/11/2017 0928   AST 21 12/31/2014 1313   ALT 10 (L) 12/11/2017 0928   ALT 23 12/31/2014 1313   ALKPHOS 55 12/11/2017 0928   ALKPHOS 66 12/31/2014 1313   BILITOT 0.4 12/11/2017 0928   BILITOT 0.2 12/31/2014 1313   GFRNONAA >60 12/11/2017 0928   GFRNONAA >60 12/31/2014 1313   GFRNONAA >60 07/16/2014 1351   GFRAA >60 12/11/2017 0928   GFRAA >60 12/31/2014 1313   GFRAA >60 07/16/2014 1351    No results found for: SPEP, UPEP  Lab Results  Component Value Date   WBC 6.3 12/11/2017   NEUTROABS 3.5 12/11/2017   HGB 11.6 (L) 12/11/2017   HCT 35.5 12/11/2017   MCV 85.5 12/11/2017   PLT 204 12/11/2017      Chemistry       Component Value Date/Time   NA 137 12/11/2017 0928   NA 143 12/31/2014 1313   K 3.6 12/11/2017 0928   K 3.5 12/31/2014 1313   CL 104 12/11/2017 0928   CL 110 (H) 12/31/2014 1313   CO2 26 12/11/2017 0928   CO2 28 12/31/2014 1313   BUN 18 12/11/2017 0928   BUN 13 12/31/2014 1313   CREATININE 0.99 12/11/2017 0928   CREATININE 0.89 12/31/2014 1313      Component Value Date/Time   CALCIUM 8.9 12/11/2017 0928   CALCIUM 8.4 (L) 12/31/2014 1313   ALKPHOS 55 12/11/2017 0928   ALKPHOS 66 12/31/2014 1313   AST 23 12/11/2017 0928   AST 21 12/31/2014 1313   ALT 10 (L) 12/11/2017 0928   ALT 23 12/31/2014 1313   BILITOT 0.4 12/11/2017 0928   BILITOT 0.2 12/31/2014 1313       RADIOGRAPHIC STUDIES: I have personally reviewed the radiological images as listed and agreed with the findings in the report. No results found.   ASSESSMENT & PLAN:  Carcinoma of upper-outer quadrant of right breast in female, estrogen receptor positive ([place]) # STAGE II ER/PR/; s/p-Her 2 Neu POSITIVE. Clinically NED. Recent mammogram [left breast] jan 2019-NED.   #As the patient's [headache/breast pain] not improved since coming off tamoxifen; this is not related to tamoxifen.  Recommend starting back on tamoxifen.  #Menorrhagia-question worse on tamoxifen; recommend restarting back.  If worse recommend follow-up with gynecology.  #Left breast tenderness physical exam no evidence of any new lumps or bumps.  Left breast mammogram; normal.  If worse/not improved recommend let us know-we will refer to Dr. Charissa Bash.  #Intermittent headaches-not improved.  Recommend neurology referral.  # follow up in 3 months.    Orders Placed This Encounter  Procedures  . Ambulatory referral to Neurology    Referral Priority:   Routine    Referral Type:   Consultation    Referral Reason:   Specialty Services Required    Referred to Provider:   Anabel Bene, MD    Requested Specialty:   Neurology    Number of Visits  Requested:   [place], MD 01/08/2018 1:01 PM

## 2018-01-08 NOTE — Progress Notes (Signed)
Patient here for followup for breast cancer. Patient reports a painful heavy menstrual cycle x 3 days. 2 light days. She reports that she had to change her menstrual pad every 45 mins due to heavy flow. Pt started back on the tamoxifen. She is still having intermittent spotting.

## 2018-02-15 ENCOUNTER — Ambulatory Visit (INDEPENDENT_AMBULATORY_CARE_PROVIDER_SITE_OTHER): Payer: BC Managed Care – PPO | Admitting: Obstetrics & Gynecology

## 2018-02-15 ENCOUNTER — Encounter: Payer: Self-pay | Admitting: General Surgery

## 2018-02-15 ENCOUNTER — Telehealth: Payer: Self-pay | Admitting: Obstetrics & Gynecology

## 2018-02-15 ENCOUNTER — Encounter: Payer: Self-pay | Admitting: Obstetrics & Gynecology

## 2018-02-15 ENCOUNTER — Ambulatory Visit: Payer: BC Managed Care – PPO | Admitting: General Surgery

## 2018-02-15 VITALS — BP 100/60 | HR 94 | Ht 63.0 in | Wt 124.0 lb

## 2018-02-15 VITALS — BP 104/62 | HR 79 | Resp 12 | Ht 63.0 in | Wt 123.0 lb

## 2018-02-15 DIAGNOSIS — N939 Abnormal uterine and vaginal bleeding, unspecified: Secondary | ICD-10-CM | POA: Diagnosis not present

## 2018-02-15 DIAGNOSIS — Z853 Personal history of malignant neoplasm of breast: Secondary | ICD-10-CM

## 2018-02-15 DIAGNOSIS — Z170 Estrogen receptor positive status [ER+]: Secondary | ICD-10-CM | POA: Diagnosis not present

## 2018-02-15 DIAGNOSIS — C50411 Malignant neoplasm of upper-outer quadrant of right female breast: Secondary | ICD-10-CM

## 2018-02-15 DIAGNOSIS — T386X5D Adverse effect of antigonadotrophins, antiestrogens, antiandrogens, not elsewhere classified, subsequent encounter: Secondary | ICD-10-CM

## 2018-02-15 NOTE — Patient Instructions (Addendum)
The patient is aware to call back for any questions or new concerns. The patient has been asked to return to the office in one year with a left diagnostic mammogram.

## 2018-02-15 NOTE — Telephone Encounter (Signed)
Patient was given available OR dates for April, and will call back after checking her schedule. Ext given.

## 2018-02-15 NOTE — Telephone Encounter (Signed)
-----   Message from Gae Dry, MD sent at 02/15/2018  2:10 PM EDT ----- Regarding: Surgery Surgery Booking Request Patient Full Name:  Sandra Nguyen  MRN: [id number]  DOB: Jul 20, 1971  Surgeon: Hoyt Koch, MD  Requested Surgery Date and Time: April, call and discuss date based on her schedule Primary Diagnosis AND Code: N93.9, T38.6X5D Secondary Diagnosis and Code: Z85.3 Surgical Procedure: TLH, BSO, Cysto L&D Notification: No Admission Status: same day surgery Length of Surgery: 1.1 hr Special Case Needs: no H&P: yes (date) Phone Interview???: yes Interpreter: Language:  Medical Clearance: no Special Scheduling Instructions: no

## 2018-02-15 NOTE — Progress Notes (Signed)
Patient ID: Sandra Nguyen, female   DOB: 1971/03/30, 47 y.o.   MRN: [id number]  Chief Complaint  Patient presents with  . Follow-up    HPI Sandra Nguyen is a 46 y.o. female.  who presents for her breast cancer follow up and a breast evaluation. The most recent mammogram was done on 12-25-17.  Patient does perform regular self breast checks and gets regular mammograms done.  She is having some discomfort lower left breast. She stopped tamoxifen for 4 weeks to see if that would help her headaches and it did not. She notices that when she stops the tamoxifen she has a period. She states she started her menstrual period this morning.  HPI  Past Medical History:  Diagnosis Date  . Anemia   . Atypical squamous cell changes of undetermined significance (ASCUS) on cervical cytology with negative high risk human papilloma virus (HPV) test result 06/20/2016  . Breast cancer [facility]) March 2015    Clinical T1c, N0, BRCA negative- Right  . Dysplasia of cervix, low grade (CIN 1)   . 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 Menorrhagia   . Migraines   . Vitamin D deficiency     Past Surgical History:  Procedure Laterality Date  . AUGMENTATION MAMMAPLASTY Bilateral Nov 2016   Dr Tula Nakayama REMOVED 07/2016  . BREAST BIOPSY Right 02/2014   positive  . BREAST RECONSTRUCTION Right 04/14/2015   Procedure: AEROLA/NIPPLE RECONSTRUCTION WITH GRAFT;  Surgeon: Nicholaus Bloom, MD;  Location: [place];  Service: Plastics;  Laterality: Right;  . BREAST SURGERY Right July 10, 2014   Right simple mastectomy with sentinel node biopsy.  . CERVICAL BIOPSY  W/ LOOP ELECTRODE EXCISION  1998  . COLONOSCOPY WITH PROPOFOL N/A 05/04/2016   Procedure: COLONOSCOPY WITH PROPOFOL;  Surgeon: Robert Bellow, MD;  Location: [facility]  ENDOSCOPY;  Service: Endoscopy;  Laterality: N/A;  . DILATATION & CURETTAGE/HYSTEROSCOPY WITH MYOSURE N/A 08/03/2017   Procedure: DILATATION & CURETTAGE/HYSTEROSCOPY WITH MYOSURE;  Surgeon: Gae Dry, MD;  Location: [facility] ORS;  Service: Gynecology;  Laterality: N/A;  . ESSURE TUBAL LIGATION  09/15/2010  . EXCISIONAL HEMORRHOIDECTOMY  2015  . I and D of hemorrhoid  05/2017   Dr. Jamal Collin  . MASTECTOMY Right 07/2014   chemo and herceptin tx  . TUBAL LIGATION      Family History  Problem Relation Age of Onset  . Cervical cancer Maternal Aunt 65  . Hypertension Mother   . Heart disease Maternal Grandmother   . Breast cancer Paternal Aunt 28  . Ovarian cancer Neg Hx   . Colon cancer Neg Hx     Social History Social History   Tobacco Use  . Smoking status: Never Smoker  . Smokeless tobacco: Never Used  Substance Use Topics  . Alcohol use: No  . Drug use: No    Allergies  Allergen Reactions  . Keflex [Cephalexin] Rash    Current Outpatient Medications  Medication Sig Dispense Refill  . Biotin (BIOTIN 5000) 5 MG CAPS Take 1 capsule by mouth daily.    . clonazePAM (KLONOPIN) 0.5 MG tablet Take 0.5 mg by mouth at bedtime as needed for anxiety.    Marland Kitchen levocetirizine (XYZAL) 5 MG tablet Take 5 mg by mouth every evening.     . tamoxifen (NOLVADEX) 20 MG tablet Take 1 tablet (  20 mg total) by mouth daily. 30 tablet 11  . venlafaxine XR (EFFEXOR-XR) 37.5 MG 24 hr capsule Take 3 capsules by mouth daily.    . Vitamin D, Ergocalciferol, (DRISDOL) 50000 units CAPS capsule TAKE 1 CAPSULE (50,000 UNITS TOTAL) BY MOUTH ONCE A WEEK. 12 capsule 1   No current facility-administered medications for this visit.     Review of Systems Review of Systems  Constitutional: Negative.   Respiratory: Negative.   Cardiovascular: Negative.     Blood pressure 104/62, pulse 79, resp. rate 12, height '5\' 3"'  (1.6 m), weight 123 lb (55.8 kg), last menstrual period 02/15/2018, SpO2 98 %.  Physical  Exam Physical Exam  Constitutional: She is oriented to person, place, and time. She appears well-developed and well-nourished.  HENT:  Mouth/Throat: Oropharynx is clear and moist.  Eyes: Conjunctivae are normal. No scleral icterus.  Neck: Neck supple.  Cardiovascular: Normal rate, regular rhythm and normal heart sounds.  Pulmonary/Chest: Effort normal and breath sounds normal. Left breast exhibits no inverted nipple, no mass, no nipple discharge, no skin change and no tenderness.    Well healed right mastectomy site, tender at edge of right muscle.  Lymphadenopathy:    She has no cervical adenopathy.    She has no axillary adenopathy.  Neurological: She is alert and oriented to person, place, and time.  Skin: Skin is warm and dry.  Psychiatric: Her behavior is normal.    Data Reviewed August 03, 2017 endometrial biopsy: DIAGNOSIS:  A. ENDOCERVIX; DILATATION AND CURETTAGE:  - BENIGN CERVICAL TISSUE.   B. ENDOMETRIUM; DILATATION AND CURETTAGE:  - STRIPS AND FRAGMENTS OF SURFACE-TYPE ENDOMETRIUM.  - BENIGN ECTOCERVICAL TISSUE.  - NEGATIVE FOR MALIGNANCY.   Left breast mammogram of December 25, 2017 reviewed.BIRAD 2.  Assessment    Benign breast exam.    Plan    She plans on discussing a hysterectomy with Dr Kenton Kingfisher.  The patient has been asked to return to the office in one year with a left screening mammogram.      HPI, Physical Exam, Assessment and Plan have been scribed under the direction and in the presence of Robert Bellow, MD. Karie Fetch, RN  I have completed the exam and reviewed the above documentation for accuracy and completeness.  I agree with the above.  Haematologist has been used and any errors in dictation or transcription are unintentional.  Hervey Ard, M.D., F.A.C.S.  Forest Gleason Zyheir Daft 02/16/2018, 7:51 PM

## 2018-02-15 NOTE — Patient Instructions (Signed)
Total Laparoscopic Hysterectomy °A total laparoscopic hysterectomy is a minimally invasive surgery to remove your uterus and cervix. This surgery is performed by making several small cuts (incisions) in your abdomen. It can also be done with a thin, lighted tube (laparoscope) inserted into two small incisions in your lower abdomen. Your fallopian tubes and ovaries can be removed (bilateral salpingo-oophorectomy) during this surgery as well. Benefits of minimally invasive surgery include: °· Less pain. °· Less risk of blood loss. °· Less risk of infection. °· Quicker return to normal activities. ° °Tell a health care provider about: °· Any allergies you have. °· All medicines you are taking, including vitamins, herbs, eye drops, creams, and over-the-counter medicines. °· Any problems you or family members have had with anesthetic medicines. °· Any blood disorders you have. °· Any surgeries you have had. °· Any medical conditions you have. °What are the risks? °Generally, this is a safe procedure. However, as with any procedure, complications can occur. Possible complications include: °· Bleeding. °· Blood clots in the legs or lung. °· Infection. °· Injury to surrounding organs. °· Problems with anesthesia. °· Early menopause symptoms (hot flashes, night sweats, insomnia). °· Risk of conversion to an open abdominal incision. ° °What happens before the procedure? °· Ask your health care provider about changing or stopping your regular medicines. °· Do not take aspirin or blood thinners (anticoagulants) for 1 week before the surgery or as told by your health care provider. °· Do not eat or drink anything for 8 hours before the surgery or as told by your health care provider. °· Quit smoking if you smoke. °· Arrange for a ride home after surgery and for someone to help you at home during recovery. °What happens during the procedure? °· You will be given antibiotic medicine. °· An IV tube will be placed in your arm. You  will be given medicine to make you sleep (general anesthetic). °· A gas (carbon dioxide) will be used to inflate your abdomen. This will allow your surgeon to look inside your abdomen, perform your surgery, and treat any other problems found if necessary. °· Three or four small incisions (often less than 1/2 inch) will be made in your abdomen. One of these incisions will be made in the area of your belly button (navel). The laparoscope will be inserted into the incision. Your surgeon will look through the laparoscope while doing your procedure. °· Other surgical instruments will be inserted through the other incisions. °· Your uterus may be removed through your vagina or cut into small pieces and removed through the small incisions. °· Your incisions will be closed. °What happens after the procedure? °· The gas will be released from inside your abdomen. °· You will be taken to the recovery area where a nurse will watch and check your progress. Once you are awake, stable, and taking fluids well, without other problems, you will return to your room or be allowed to go home. °· There is usually minimal discomfort following the surgery because the incisions are so small. °· You will be given pain medicine while you are in the hospital and for when you go home. °This information is not intended to replace advice given to you by your health care provider. Make sure you discuss any questions you have with your health care provider. °Document Released: 09/18/2007 Document Revised: 04/28/2016 Document Reviewed: 06/11/2013 °Elsevier Interactive Patient Education © 2017 Elsevier Inc. ° °

## 2018-02-15 NOTE — Progress Notes (Signed)
HPI:      [name] is a 47 y.o. G2P0011 who LMP was Patient's last menstrual period was 02/15/2018., presents today for a problem visit.  She complains of irreg bleeding as she takes Tamoxifen for breast cancer (surgery, chemo, no radiation for her past tx's).  Has had D&C in past, normal pathology.  Continues to have lower abdominal random associated pains and irreg bleeding as she takes the Tamosifen.  Desires hysterectomy.   PMHx: She  has a past medical history of Anemia, Atypical squamous cell changes of undetermined significance (ASCUS) on cervical cytology with negative high risk human papilloma virus (HPV) test result (06/20/2016), Breast cancer [facility]) (March 2015 ), Dysplasia of cervix, low grade (CIN 1), Genetic testing of female (02/2014), Malignant neoplasm of upper-outer quadrant of female breast [facility]) (August 2015), Menorrhagia, Migraines, and Vitamin D deficiency. Also,  has a past surgical history that includes Excisional hemorrhoidectomy (2015); Mastectomy (Right, 07/2014); Colonoscopy with propofol (N/A, 05/04/2016); Cervical biopsy w/ loop electrode excision (1998); Essure tubal ligation (09/15/2010); I and D of hemorrhoid (05/2017); Tubal ligation; Breast surgery (Right, July 10, 2014); Breast reconstruction (Right, 04/14/2015); Breast biopsy (Right, 02/2014); Dilatation & curettage/hysteroscopy with myosure (N/A, 08/03/2017); and Augmentation mammaplasty (Bilateral, Nov 2016)., family history includes Breast cancer (age of onset: 25) in her paternal aunt; Cervical cancer (age of onset: 26) in her maternal aunt; Heart disease in her maternal grandmother; Hypertension in her mother.,  reports that  has never smoked. she has never used smokeless tobacco. She reports that she does not drink alcohol or use drugs.  She  Current Outpatient Medications:  .  Biotin (BIOTIN 5000) 5 MG CAPS, Take 1 capsule by mouth daily., Disp: , Rfl:  .  clonazePAM (KLONOPIN) 0.5 MG tablet, Take 0.5 mg  by mouth at bedtime as needed for anxiety., Disp: , Rfl:  .  levocetirizine (XYZAL) 5 MG tablet, Take 5 mg by mouth every evening. , Disp: , Rfl:  .  tamoxifen (NOLVADEX) 20 MG tablet, Take 1 tablet (20 mg total) by mouth daily., Disp: 30 tablet, Rfl: 11 .  venlafaxine XR (EFFEXOR-XR) 37.5 MG 24 hr capsule, Take 3 capsules by mouth daily., Disp: , Rfl:  .  Vitamin D, Ergocalciferol, (DRISDOL) 50000 units CAPS capsule, TAKE 1 CAPSULE (50,000 UNITS TOTAL) BY MOUTH ONCE A WEEK., Disp: 12 capsule, Rfl: 1  Also, is allergic to keflex [cephalexin].  Review of Systems  Constitutional: Negative for chills, fever and malaise/fatigue.  HENT: Negative for congestion, sinus pain and sore throat.   Eyes: Negative for blurred vision and pain.  Respiratory: Negative for cough and wheezing.   Cardiovascular: Negative for chest pain and leg swelling.  Gastrointestinal: Negative for abdominal pain, constipation, diarrhea, heartburn, nausea and vomiting.  Genitourinary: Negative for dysuria, frequency, hematuria and urgency.  Musculoskeletal: Negative for back pain, joint pain, myalgias and neck pain.  Skin: Negative for itching and rash.  Neurological: Negative for dizziness, tremors and weakness.  Endo/Heme/Allergies: Does not bruise/bleed easily.  Psychiatric/Behavioral: Negative for depression. The patient is not nervous/anxious and does not have insomnia.    Objective: BP 100/60   Pulse 94   Ht 5\' 3"  (1.6 m)   Wt 124 lb (56.2 kg)   LMP 02/15/2018   BMI 21.97 kg/m  Physical Exam  Constitutional: She is oriented to person, place, and time. She appears well-developed and well-nourished. No distress.  Musculoskeletal: Normal range of motion.  Neurological: She is alert and oriented to person, place, and time.  Skin: Skin is warm and dry.  Psychiatric: She has a normal mood and affect.  Vitals reviewed.  ASSESSMENT/PLAN: Problem List Items Addressed This Visit      Other   History of right  breast cancer    Other Visit Diagnoses    Abnormal uterine bleeding    -  Primary   Adverse effect of tamoxifen, subsequent encounter        Options discussed, prefers hysterectomy. Plan TLH BSO.  Treat bleeding and pain, prevent uterine or ovarian cancer, also may decrease recurrence breast cancer.  Pros and cons of surgery discussed.  To schedule once she knows work schedule.  Barnett Applebaum, MD, [facility], [facility] 02/15/2018  2:14 PM

## 2018-02-16 NOTE — Telephone Encounter (Signed)
Patient is aware of H&P at [facility] on 03/09/18 @ 8am, Pre-admit Testing phone interview to be scheduled, and OR on 03/15/18. Patient is aware she may receive calls from the [facility] and [facility].

## 2018-02-16 NOTE — Telephone Encounter (Signed)
-----   Message from Gae Dry, MD sent at 02/15/2018  2:10 PM EDT ----- Regarding: Surgery Surgery Booking Request Patient Full Name:  Sandra Nguyen  MRN: [id number]  DOB: 30-Jun-1971  Surgeon: Hoyt Koch, MD  Requested Surgery Date and Time: April, call and discuss date based on her schedule Primary Diagnosis AND Code: N93.9, T38.6X5D Secondary Diagnosis and Code: Z85.3 Surgical Procedure: TLH, BSO, Cysto L&D Notification: No Admission Status: same day surgery Length of Surgery: 1.1 hr Special Case Needs: no H&P: yes (date) Phone Interview???: yes Interpreter: Language:  Medical Clearance: no Special Scheduling Instructions: no

## 2018-02-23 ENCOUNTER — Other Ambulatory Visit: Payer: Self-pay | Admitting: Internal Medicine

## 2018-02-23 NOTE — Telephone Encounter (Signed)
He has the patient on high dose vit. D to help with symptom mgmt for boney pain r/t AI use.  Dr. Rogue Bussing most likely would be ok with renewing this medication. Jenny/Lauren, please approve?

## 2018-02-23 NOTE — Telephone Encounter (Signed)
Do not see that patient has had her Vitamin D level checked, I looked back as far as 2015. Does she need to continue 50,000 units weekly? She has completed treatment is this something her PCP should be handling?

## 2018-03-09 ENCOUNTER — Other Ambulatory Visit: Payer: Self-pay

## 2018-03-09 ENCOUNTER — Ambulatory Visit (INDEPENDENT_AMBULATORY_CARE_PROVIDER_SITE_OTHER): Payer: BC Managed Care – PPO | Admitting: Obstetrics & Gynecology

## 2018-03-09 ENCOUNTER — Encounter: Payer: Self-pay | Admitting: Obstetrics & Gynecology

## 2018-03-09 ENCOUNTER — Encounter
Admission: RE | Admit: 2018-03-09 | Discharge: 2018-03-09 | Disposition: A | Discharge status: Home / Self Care | Payer: BC Managed Care – PPO | Source: Ambulatory Visit | Attending: Obstetrics & Gynecology | Admitting: Obstetrics & Gynecology

## 2018-03-09 VITALS — BP 100/60 | HR 70 | Ht 63.0 in | Wt 124.0 lb

## 2018-03-09 DIAGNOSIS — R102 Pelvic and perineal pain: Secondary | ICD-10-CM | POA: Diagnosis not present

## 2018-03-09 DIAGNOSIS — Z853 Personal history of malignant neoplasm of breast: Secondary | ICD-10-CM

## 2018-03-09 DIAGNOSIS — R9389 Abnormal findings on diagnostic imaging of other specified body structures: Secondary | ICD-10-CM | POA: Diagnosis not present

## 2018-03-09 HISTORY — DX: Anxiety disorder, unspecified: F41.9

## 2018-03-09 NOTE — Patient Instructions (Addendum)
Your procedure is scheduled on: 03-15-18 THURSDAY Report to Same Day Surgery 2nd floor medical mall [facility] Entrance-take elevator on left to 2nd floor.  Check in with surgery information desk.) To find out your arrival time please call [phone] between 1PM - 3PM on 03-14-18 [facility]  Remember: Instructions that are not followed completely may result in serious medical risk, up to and including death, or upon the discretion of your surgeon and anesthesiologist your surgery may need to be rescheduled.    _x___ 1. Do not eat food after midnight the night before your procedure. NO GUM OR CANDY AFTER MIDNIGHT.  You may drink clear liquids up to 2 hours before you are scheduled to arrive at the hospital for your procedure.  Do not drink clear liquids within 2 hours of your scheduled arrival to the hospital.  Clear liquids include  --Water or Apple juice without pulp  --Clear carbohydrate beverage such as ClearFast or Gatorade  --Black Coffee or Clear Tea (No milk, no creamers, do not add anything to  the coffee or Tea     __x__ 2. No Alcohol for 24 hours before or after surgery.   __x__3. No Smoking or e-cigarettes for 24 prior to surgery.  Do not use any chewable tobacco products for at least 6 hour prior to surgery   ____  4. Bring all medications with you on the day of surgery if instructed.    __x__ 5. Notify your doctor if there is any change in your medical condition     (cold, fever, infections).    x___6. On the morning of surgery brush your teeth with toothpaste and water.  You may rinse your mouth with mouth wash if you wish.  Do not swallow any toothpaste or mouthwash.   Do not wear jewelry, make-up, hairpins, clips or nail polish.  Do not wear lotions, powders, or perfumes. You may wear deodorant.  Do not shave 48 hours prior to surgery. Men may shave face and neck.  Do not bring valuables to the hospital.    [facility] is not responsible for any belongings or  valuables.               Contacts, dentures or bridgework may not be worn into surgery.  Leave your suitcase in the car. After surgery it may be brought to your room.  For patients admitted to the hospital, discharge time is determined by your treatment team.  _  Patients discharged the day of surgery will not be allowed to drive home.  You will need someone to drive you home and stay with you the night of your procedure.    Please read over the following fact sheets that you were given:   [facility] Preparing for Surgery and or MRSA Information   _x___ TAKE THE FOLLOWING MEDICATION THE MORNING OF SURGERY. These include:  1. TAMOXIFEN  2. EFFEXOR  3.  4.  5.  6.  ____Fleets enema or Magnesium Citrate as directed.   _x___ Use CHG Soap or sage wipes as directed on instruction sheet   ____ Use inhalers on the day of surgery and bring to hospital day of surgery  ____ Stop Metformin and Janumet 2 days prior to surgery.    ____ Take 1/2 of usual insulin dose the night before surgery and none on the morning surgery.   ____ Follow recommendations from Cardiologist, Pulmonologist or PCP regarding  stopping Aspirin, Coumadin, Plavix ,Eliquis, Effient, or Pradaxa, and Pletal.  X____Stop  Anti-inflammatories such as Advil, Aleve, Ibuprofen, Motrin, Naproxen, Naprosyn, Goodies powders or aspirin products. OK to take Tylenol    _x___ Stop supplements until after surgery-STOP BIOTIN NOW-MAY RESUME AFTER SURGERY   ____ Bring C-Pap to the hospital.

## 2018-03-09 NOTE — H&P (View-Only) (Signed)
PRE-OPERATIVE HISTORY AND PHYSICAL EXAM  HPI:  Sandra Nguyen is a 47 y.o. G2P0011 Patient's last menstrual period was 02/15/2018.; she is being admitted for surgery related to pain and bleeding along with cancer risks.  She complains of irreg bleeding as she takes Tamoxifen for breast cancer (surgery, chemo, no radiation for her past tx's).  Has had D&C in past, normal pathology.  Continues to have lower abdominal random associated pains and irreg bleeding as she takes the Tamosifen.  Desires hysterectomy. Marland Kitchen  PMHx: Past Medical History:  Diagnosis Date  . Anemia   . Atypical squamous cell changes of undetermined significance (ASCUS) on cervical cytology with negative high risk human papilloma virus (HPV) test result 06/20/2016  . Breast cancer [facility]) March 2015    Clinical T1c, N0, BRCA negative- Right  . Dysplasia of cervix, low grade (CIN 1)   . 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 Malignant neoplasm of upper-outer quadrant of female breast [facility]) August 2015   pT1b, N0; M0. 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 Menorrhagia   . Migraines   . Vitamin D deficiency    Past Surgical History:  Procedure Laterality Date  . AUGMENTATION MAMMAPLASTY Bilateral Nov 2016   Dr Tula Nakayama REMOVED 07/2016  . BREAST BIOPSY Right 02/2014   positive  . BREAST RECONSTRUCTION Right 04/14/2015   Procedure: AEROLA/NIPPLE RECONSTRUCTION WITH GRAFT;  Surgeon: Nicholaus Bloom, MD;  Location: [place];  Service: Plastics;  Laterality: Right;  . BREAST SURGERY Right July 10, 2014   Right simple mastectomy with sentinel node biopsy.  . CERVICAL BIOPSY  W/ LOOP ELECTRODE EXCISION  1998  . COLONOSCOPY WITH PROPOFOL N/A 05/04/2016   Procedure: COLONOSCOPY WITH PROPOFOL;  Surgeon: Alyss Granato Bellow, MD;  Location: [facility] ENDOSCOPY;  Service: Endoscopy;  Laterality: N/A;  . DILATATION & CURETTAGE/HYSTEROSCOPY WITH MYOSURE N/A 08/03/2017   Procedure:  DILATATION & CURETTAGE/HYSTEROSCOPY WITH MYOSURE;  Surgeon: Gae Dry, MD;  Location: [facility] ORS;  Service: Gynecology;  Laterality: N/A;  . ESSURE TUBAL LIGATION  09/15/2010  . EXCISIONAL HEMORRHOIDECTOMY  2015  . I and D of hemorrhoid  05/2017   Dr. Jamal Collin  . MASTECTOMY Right 07/2014   chemo and herceptin tx  . TUBAL LIGATION     Family History  Problem Relation Age of Onset  . Cervical cancer Maternal Aunt 65  . Hypertension Mother   . Heart disease Maternal Grandmother   . Breast cancer Paternal Aunt 18  . Ovarian cancer Neg Hx   . Colon cancer Neg Hx    Social History   Tobacco Use  . Smoking status: Never Smoker  . Smokeless tobacco: Never Used  Substance Use Topics  . Alcohol use: No  . Drug use: No    Current Outpatient Medications:  .  Biotin 10000 MCG TABS, Take 10,000 mcg by mouth at bedtime., Disp: , Rfl:  .  clonazePAM (KLONOPIN) 0.5 MG tablet, Take 1 mg by mouth at bedtime. , Disp: , Rfl:  .  levocetirizine (XYZAL) 5 MG tablet, Take 5 mg by mouth every evening. , Disp: , Rfl:  .  tamoxifen (NOLVADEX) 20 MG tablet, Take 1 tablet (20 mg total) by mouth daily., Disp: 30 tablet, Rfl: 11 .  venlafaxine XR (EFFEXOR-XR) 37.5 MG 24 hr capsule, Take 75 mg by mouth daily. , Disp: , Rfl:  .  Vitamin D, Ergocalciferol, (DRISDOL) 50000 units CAPS capsule, TAKE ONE CAPSULE BY MOUTH ONE TIME PER  WEEK, Disp: 12 capsule, Rfl: 1 Allergies: Keflex [cephalexin]  Review of Systems  Constitutional: Negative for chills, fever and malaise/fatigue.  HENT: Negative for congestion, sinus pain and sore throat.   Eyes: Negative for blurred vision and pain.  Respiratory: Negative for cough and wheezing.   Cardiovascular: Negative for chest pain and leg swelling.  Gastrointestinal: Negative for abdominal pain, constipation, diarrhea, heartburn, nausea and vomiting.  Genitourinary: Negative for dysuria, frequency, hematuria and urgency.  Musculoskeletal: Negative for back pain, joint  pain, myalgias and neck pain.  Skin: Negative for itching and rash.  Neurological: Negative for dizziness, tremors and weakness.  Endo/Heme/Allergies: Does not bruise/bleed easily.  Psychiatric/Behavioral: Negative for depression. The patient is not nervous/anxious and does not have insomnia.     Objective: BP 100/60   Pulse 70   Ht '5\' 3"'  (1.6 m)   Wt 124 lb (56.2 kg)   LMP 02/15/2018   BMI 21.97 kg/m   Filed Weights   03/09/18 0806  Weight: 124 lb (56.2 kg)   Physical Exam  Constitutional: She is oriented to person, place, and time. She appears well-developed and well-nourished. No distress.  HENT:  Head: Normocephalic and atraumatic. Head is without laceration.  Right Ear: Hearing normal.  Left Ear: Hearing normal.  Nose: No epistaxis.  No foreign bodies.  Mouth/Throat: Uvula is midline, oropharynx is clear and moist and mucous membranes are normal.  Eyes: Pupils are equal, round, and reactive to light.  Neck: Normal range of motion. Neck supple. No thyromegaly present.  Cardiovascular: Normal rate and regular rhythm. Exam reveals no gallop and no friction rub.  No murmur heard. Pulmonary/Chest: Effort normal and breath sounds normal. No respiratory distress. She has no wheezes. Right breast exhibits no mass, no skin change and no tenderness. Left breast exhibits no mass, no skin change and no tenderness.  Abdominal: Soft. Bowel sounds are normal. She exhibits no distension. There is no tenderness. There is no rebound.  Musculoskeletal: Normal range of motion.  Neurological: She is alert and oriented to person, place, and time. No cranial nerve deficit.  Skin: Skin is warm and dry.  Psychiatric: She has a normal mood and affect. Judgment normal.  Vitals reviewed.  Assessment: 1. Endometrial thickening on ultrasound   2. History of right breast cancer   3. Pelvic pain   TLH BSO due to bleeding pain and cancer risks  I have had a careful discussion with this patient about  all the options available and the risk/benefits of each. I have fully informed this patient that surgery may subject her to a variety of discomforts and risks: She understands that most patients have surgery with little difficulty, but problems can happen ranging from minor to fatal. These include nausea, vomiting, pain, bleeding, infection, poor healing, hernia, or formation of adhesions. Unexpected reactions may occur from any drug or anesthetic given. Unintended injury may occur to other pelvic or abdominal structures such as Fallopian tubes, ovaries, bladder, ureter (tube from kidney to bladder), or bowel. Nerves going from the pelvis to the legs may be injured. Any such injury may require immediate or later additional surgery to correct the problem. Excessive blood loss requiring transfusion is very unlikely but possible. Dangerous blood clots may form in the legs or lungs. Physical and sexual activity will be restricted in varying degrees for an indeterminate period of time but most often 2-6 weeks.  Finally, she understands that it is impossible to list every possible undesirable effect and that the condition for  which surgery is done is not always cured or significantly improved, and in rare cases may be even worse.Ample time was given to answer all questions.  Barnett Applebaum, MD, [facility], [facility] 03/09/2018  8:13 AM

## 2018-03-09 NOTE — Patient Instructions (Signed)

## 2018-03-09 NOTE — Progress Notes (Signed)
PRE-OPERATIVE HISTORY AND PHYSICAL EXAM  HPI:  Sandra Nguyen is a 47 y.o. G2P0011 Patient's last menstrual period was 02/15/2018.; she is being admitted for surgery related to pain and bleeding along with cancer risks.  She complains of irreg bleeding as she takes Tamoxifen for breast cancer (surgery, chemo, no radiation for her past tx's).  Has had D&C in past, normal pathology.  Continues to have lower abdominal random associated pains and irreg bleeding as she takes the Tamosifen.  Desires hysterectomy. Marland Kitchen  PMHx: Past Medical History:  Diagnosis Date  . Anemia   . Atypical squamous cell changes of undetermined significance (ASCUS) on cervical cytology with negative high risk human papilloma virus (HPV) test result 06/20/2016  . Breast cancer [facility]) March 2015    Clinical T1c, N0, BRCA negative- Right  . Dysplasia of cervix, low grade (CIN 1)   . 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 Malignant neoplasm of upper-outer quadrant of female breast [facility]) August 2015   pT1b, N0; M0. 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 Menorrhagia   . Migraines   . Vitamin D deficiency    Past Surgical History:  Procedure Laterality Date  . AUGMENTATION MAMMAPLASTY Bilateral Nov 2016   Dr Tula Nakayama REMOVED 07/2016  . BREAST BIOPSY Right 02/2014   positive  . BREAST RECONSTRUCTION Right 04/14/2015   Procedure: AEROLA/NIPPLE RECONSTRUCTION WITH GRAFT;  Surgeon: Nicholaus Bloom, MD;  Location: [place];  Service: Plastics;  Laterality: Right;  . BREAST SURGERY Right July 10, 2014   Right simple mastectomy with sentinel node biopsy.  . CERVICAL BIOPSY  W/ LOOP ELECTRODE EXCISION  1998  . COLONOSCOPY WITH PROPOFOL N/A 05/04/2016   Procedure: COLONOSCOPY WITH PROPOFOL;  Surgeon: Robert Bellow, MD;  Location: [facility] ENDOSCOPY;  Service: Endoscopy;  Laterality: N/A;  . DILATATION & CURETTAGE/HYSTEROSCOPY WITH MYOSURE N/A 08/03/2017   Procedure:  DILATATION & CURETTAGE/HYSTEROSCOPY WITH MYOSURE;  Surgeon: Gae Dry, MD;  Location: [facility] ORS;  Service: Gynecology;  Laterality: N/A;  . ESSURE TUBAL LIGATION  09/15/2010  . EXCISIONAL HEMORRHOIDECTOMY  2015  . I and D of hemorrhoid  05/2017   Dr. Jamal Collin  . MASTECTOMY Right 07/2014   chemo and herceptin tx  . TUBAL LIGATION     Family History  Problem Relation Age of Onset  . Cervical cancer Maternal Aunt 65  . Hypertension Mother   . Heart disease Maternal Grandmother   . Breast cancer Paternal Aunt 61  . Ovarian cancer Neg Hx   . Colon cancer Neg Hx    Social History   Tobacco Use  . Smoking status: Never Smoker  . Smokeless tobacco: Never Used  Substance Use Topics  . Alcohol use: No  . Drug use: No    Current Outpatient Medications:  .  Biotin 10000 MCG TABS, Take 10,000 mcg by mouth at bedtime., Disp: , Rfl:  .  clonazePAM (KLONOPIN) 0.5 MG tablet, Take 1 mg by mouth at bedtime. , Disp: , Rfl:  .  levocetirizine (XYZAL) 5 MG tablet, Take 5 mg by mouth every evening. , Disp: , Rfl:  .  tamoxifen (NOLVADEX) 20 MG tablet, Take 1 tablet (20 mg total) by mouth daily., Disp: 30 tablet, Rfl: 11 .  venlafaxine XR (EFFEXOR-XR) 37.5 MG 24 hr capsule, Take 75 mg by mouth daily. , Disp: , Rfl:  .  Vitamin D, Ergocalciferol, (DRISDOL) 50000 units CAPS capsule, TAKE ONE CAPSULE BY MOUTH ONE TIME PER  WEEK, Disp: 12 capsule, Rfl: 1 Allergies: Keflex [cephalexin]  Review of Systems  Constitutional: Negative for chills, fever and malaise/fatigue.  HENT: Negative for congestion, sinus pain and sore throat.   Eyes: Negative for blurred vision and pain.  Respiratory: Negative for cough and wheezing.   Cardiovascular: Negative for chest pain and leg swelling.  Gastrointestinal: Negative for abdominal pain, constipation, diarrhea, heartburn, nausea and vomiting.  Genitourinary: Negative for dysuria, frequency, hematuria and urgency.  Musculoskeletal: Negative for back pain, joint  pain, myalgias and neck pain.  Skin: Negative for itching and rash.  Neurological: Negative for dizziness, tremors and weakness.  Endo/Heme/Allergies: Does not bruise/bleed easily.  Psychiatric/Behavioral: Negative for depression. The patient is not nervous/anxious and does not have insomnia.     Objective: BP 100/60   Pulse 70   Ht '5\' 3"'  (1.6 m)   Wt 124 lb (56.2 kg)   LMP 02/15/2018   BMI 21.97 kg/m   Filed Weights   03/09/18 0806  Weight: 124 lb (56.2 kg)   Physical Exam  Constitutional: She is oriented to person, place, and time. She appears well-developed and well-nourished. No distress.  HENT:  Head: Normocephalic and atraumatic. Head is without laceration.  Right Ear: Hearing normal.  Left Ear: Hearing normal.  Nose: No epistaxis.  No foreign bodies.  Mouth/Throat: Uvula is midline, oropharynx is clear and moist and mucous membranes are normal.  Eyes: Pupils are equal, round, and reactive to light.  Neck: Normal range of motion. Neck supple. No thyromegaly present.  Cardiovascular: Normal rate and regular rhythm. Exam reveals no gallop and no friction rub.  No murmur heard. Pulmonary/Chest: Effort normal and breath sounds normal. No respiratory distress. She has no wheezes. Right breast exhibits no mass, no skin change and no tenderness. Left breast exhibits no mass, no skin change and no tenderness.  Abdominal: Soft. Bowel sounds are normal. She exhibits no distension. There is no tenderness. There is no rebound.  Musculoskeletal: Normal range of motion.  Neurological: She is alert and oriented to person, place, and time. No cranial nerve deficit.  Skin: Skin is warm and dry.  Psychiatric: She has a normal mood and affect. Judgment normal.  Vitals reviewed.  Assessment: 1. Endometrial thickening on ultrasound   2. History of right breast cancer   3. Pelvic pain   TLH BSO due to bleeding pain and cancer risks  I have had a careful discussion with this patient about  all the options available and the risk/benefits of each. I have fully informed this patient that surgery may subject her to a variety of discomforts and risks: She understands that most patients have surgery with little difficulty, but problems can happen ranging from minor to fatal. These include nausea, vomiting, pain, bleeding, infection, poor healing, hernia, or formation of adhesions. Unexpected reactions may occur from any drug or anesthetic given. Unintended injury may occur to other pelvic or abdominal structures such as Fallopian tubes, ovaries, bladder, ureter (tube from kidney to bladder), or bowel. Nerves going from the pelvis to the legs may be injured. Any such injury may require immediate or later additional surgery to correct the problem. Excessive blood loss requiring transfusion is very unlikely but possible. Dangerous blood clots may form in the legs or lungs. Physical and sexual activity will be restricted in varying degrees for an indeterminate period of time but most often 2-6 weeks.  Finally, she understands that it is impossible to list every possible undesirable effect and that the condition for  which surgery is done is not always cured or significantly improved, and in rare cases may be even worse.Ample time was given to answer all questions.  Barnett Applebaum, MD, [facility], [facility] 03/09/2018  8:13 AM

## 2018-03-12 ENCOUNTER — Encounter
Admission: RE | Admit: 2018-03-12 | Discharge: 2018-03-12 | Disposition: A | Discharge status: Home / Self Care | Payer: BC Managed Care – PPO | Source: Ambulatory Visit | Attending: Obstetrics & Gynecology | Admitting: Obstetrics & Gynecology

## 2018-03-12 DIAGNOSIS — E559 Vitamin D deficiency, unspecified: Secondary | ICD-10-CM | POA: Diagnosis not present

## 2018-03-12 DIAGNOSIS — Z9221 Personal history of antineoplastic chemotherapy: Secondary | ICD-10-CM | POA: Diagnosis not present

## 2018-03-12 DIAGNOSIS — N83201 Unspecified ovarian cyst, right side: Secondary | ICD-10-CM | POA: Diagnosis not present

## 2018-03-12 DIAGNOSIS — N800 Endometriosis of uterus: Secondary | ICD-10-CM | POA: Diagnosis not present

## 2018-03-12 DIAGNOSIS — N838 Other noninflammatory disorders of ovary, fallopian tube and broad ligament: Secondary | ICD-10-CM | POA: Diagnosis not present

## 2018-03-12 DIAGNOSIS — N939 Abnormal uterine and vaginal bleeding, unspecified: Secondary | ICD-10-CM | POA: Diagnosis not present

## 2018-03-12 DIAGNOSIS — Z9011 Acquired absence of right breast and nipple: Secondary | ICD-10-CM | POA: Diagnosis not present

## 2018-03-12 DIAGNOSIS — F419 Anxiety disorder, unspecified: Secondary | ICD-10-CM | POA: Diagnosis not present

## 2018-03-12 DIAGNOSIS — Z79899 Other long term (current) drug therapy: Secondary | ICD-10-CM | POA: Diagnosis not present

## 2018-03-12 DIAGNOSIS — Z853 Personal history of malignant neoplasm of breast: Secondary | ICD-10-CM | POA: Diagnosis not present

## 2018-03-12 LAB — TYPE AND SCREEN
ABO/RH(D): B POS
ANTIBODY SCREEN: NEGATIVE

## 2018-03-12 LAB — CBC
HCT: 36 % (ref 35.0–47.0)
HEMOGLOBIN: 11.5 g/dL — AB (ref 12.0–16.0)
MCH: 27.9 pg (ref 26.0–34.0)
MCHC: 32 g/dL (ref 32.0–36.0)
MCV: 87.4 fL (ref 80.0–100.0)
Platelets: 208 10*3/uL (ref 150–440)
RBC: 4.13 MIL/uL (ref 3.80–5.20)
RDW: 13.8 % (ref 11.5–14.5)
WBC: 3.9 10*3/uL (ref 3.6–11.0)

## 2018-03-14 MED ORDER — GENTAMICIN SULFATE 40 MG/ML IJ SOLN
INTRAVENOUS | Status: AC
  Administered 2018-03-15: 100 mL via INTRAVENOUS
  Filled 2018-03-14: qty 7

## 2018-03-15 ENCOUNTER — Ambulatory Visit
Admission: RE | Admit: 2018-03-15 | Discharge: 2018-03-15 | Disposition: A | Discharge status: Home / Self Care | Payer: BC Managed Care – PPO | Source: Ambulatory Visit | Attending: Obstetrics & Gynecology | Admitting: Obstetrics & Gynecology

## 2018-03-15 ENCOUNTER — Other Ambulatory Visit: Payer: Self-pay | Admitting: Obstetrics and Gynecology

## 2018-03-15 ENCOUNTER — Ambulatory Visit: Payer: BC Managed Care – PPO | Admitting: Anesthesiology

## 2018-03-15 ENCOUNTER — Encounter
Admission: RE | Disposition: A | Discharge status: Home / Self Care | Payer: Self-pay | Source: Ambulatory Visit | Attending: Obstetrics & Gynecology

## 2018-03-15 ENCOUNTER — Encounter: Payer: Self-pay | Admitting: *Deleted

## 2018-03-15 ENCOUNTER — Other Ambulatory Visit: Payer: Self-pay

## 2018-03-15 DIAGNOSIS — Z79899 Other long term (current) drug therapy: Secondary | ICD-10-CM | POA: Insufficient documentation

## 2018-03-15 DIAGNOSIS — N83201 Unspecified ovarian cyst, right side: Secondary | ICD-10-CM | POA: Insufficient documentation

## 2018-03-15 DIAGNOSIS — N939 Abnormal uterine and vaginal bleeding, unspecified: Secondary | ICD-10-CM | POA: Diagnosis not present

## 2018-03-15 DIAGNOSIS — F419 Anxiety disorder, unspecified: Secondary | ICD-10-CM | POA: Insufficient documentation

## 2018-03-15 DIAGNOSIS — N838 Other noninflammatory disorders of ovary, fallopian tube and broad ligament: Secondary | ICD-10-CM | POA: Insufficient documentation

## 2018-03-15 DIAGNOSIS — E559 Vitamin D deficiency, unspecified: Secondary | ICD-10-CM | POA: Insufficient documentation

## 2018-03-15 DIAGNOSIS — N800 Endometriosis of uterus: Secondary | ICD-10-CM | POA: Insufficient documentation

## 2018-03-15 DIAGNOSIS — Z853 Personal history of malignant neoplasm of breast: Secondary | ICD-10-CM | POA: Insufficient documentation

## 2018-03-15 DIAGNOSIS — Z9011 Acquired absence of right breast and nipple: Secondary | ICD-10-CM | POA: Insufficient documentation

## 2018-03-15 DIAGNOSIS — Z9221 Personal history of antineoplastic chemotherapy: Secondary | ICD-10-CM | POA: Insufficient documentation

## 2018-03-15 DIAGNOSIS — Z90710 Acquired absence of both cervix and uterus: Secondary | ICD-10-CM

## 2018-03-15 HISTORY — PX: CYSTOSCOPY: SHX5120

## 2018-03-15 HISTORY — PX: LAPAROSCOPIC HYSTERECTOMY: SHX1926

## 2018-03-15 LAB — POCT PREGNANCY, URINE: PREG TEST UR: NEGATIVE

## 2018-03-15 SURGERY — HYSTERECTOMY, TOTAL, LAPAROSCOPIC
Anesthesia: General | Laterality: Bilateral | Wound class: Clean Contaminated

## 2018-03-15 MED ORDER — PROPOFOL 10 MG/ML IV BOLUS
INTRAVENOUS | Status: DC | PRN
  Administered 2018-03-15: 120 mg via INTRAVENOUS

## 2018-03-15 MED ORDER — MIDAZOLAM HCL 2 MG/2ML IJ SOLN
INTRAMUSCULAR | Status: DC | PRN
  Administered 2018-03-15: 2 mg via INTRAVENOUS
  Administered 2018-03-15: 1 mg via INTRAVENOUS
  Administered 2018-03-15: 2 mg via INTRAVENOUS

## 2018-03-15 MED ORDER — LIDOCAINE HCL (PF) 2 % IJ SOLN
INTRAMUSCULAR | Status: AC
Start: 2018-03-15 — End: 2018-03-15
  Filled 2018-03-15: qty 10

## 2018-03-15 MED ORDER — ACETAMINOPHEN 325 MG PO TABS: 650.0000 mg | ORAL_TABLET | ORAL | Status: DC | PRN

## 2018-03-15 MED ORDER — ONDANSETRON HCL 4 MG/2ML IJ SOLN
INTRAMUSCULAR | Status: AC
  Filled 2018-03-15: qty 2

## 2018-03-15 MED ORDER — LIDOCAINE HCL (CARDIAC) 20 MG/ML IV SOLN
INTRAVENOUS | Status: DC | PRN
  Administered 2018-03-15: 60 mg via INTRAVENOUS

## 2018-03-15 MED ORDER — LACTATED RINGERS IV SOLN: INTRAVENOUS | Status: DC

## 2018-03-15 MED ORDER — HYDROMORPHONE HCL 1 MG/ML IJ SOLN
INTRAMUSCULAR | Status: DC | PRN
  Administered 2018-03-15: 1 mg via INTRAVENOUS

## 2018-03-15 MED ORDER — DEXAMETHASONE SODIUM PHOSPHATE 10 MG/ML IJ SOLN
INTRAMUSCULAR | Status: DC | PRN
  Administered 2018-03-15: 10 mg via INTRAVENOUS

## 2018-03-15 MED ORDER — ROCURONIUM BROMIDE 100 MG/10ML IV SOLN
INTRAVENOUS | Status: DC | PRN
  Administered 2018-03-15: 50 mg via INTRAVENOUS

## 2018-03-15 MED ORDER — LACTATED RINGERS IV SOLN
INTRAVENOUS | Status: DC
  Administered 2018-03-15 (×2): via INTRAVENOUS

## 2018-03-15 MED ORDER — MIDAZOLAM HCL 5 MG/5ML IJ SOLN
INTRAMUSCULAR | Status: AC
  Filled 2018-03-15: qty 5

## 2018-03-15 MED ORDER — SUGAMMADEX SODIUM 200 MG/2ML IV SOLN
INTRAVENOUS | Status: AC
  Filled 2018-03-15: qty 2

## 2018-03-15 MED ORDER — BUPIVACAINE HCL (PF) 0.5 % IJ SOLN
INTRAMUSCULAR | Status: AC
  Filled 2018-03-15: qty 30

## 2018-03-15 MED ORDER — ACETAMINOPHEN 650 MG RE SUPP
650.0000 mg | RECTAL | Status: DC | PRN
  Filled 2018-03-15: qty 1

## 2018-03-15 MED ORDER — OXYCODONE-ACETAMINOPHEN 5-325 MG PO TABS
ORAL_TABLET | ORAL | Status: AC
  Filled 2018-03-15: qty 1

## 2018-03-15 MED ORDER — FAMOTIDINE 20 MG PO TABS
20.0000 mg | ORAL_TABLET | Freq: Once | ORAL | Status: AC
  Administered 2018-03-15: 20 mg via ORAL

## 2018-03-15 MED ORDER — ONDANSETRON HCL 4 MG/2ML IJ SOLN
INTRAMUSCULAR | Status: DC | PRN
  Administered 2018-03-15: 4 mg via INTRAVENOUS

## 2018-03-15 MED ORDER — GLYCOPYRROLATE 0.2 MG/ML IJ SOLN
INTRAMUSCULAR | Status: DC | PRN
  Administered 2018-03-15: 0.1 mg via INTRAVENOUS

## 2018-03-15 MED ORDER — OXYCODONE-ACETAMINOPHEN 5-325 MG PO TABS
1.0000 | ORAL_TABLET | ORAL | Status: DC | PRN
  Administered 2018-03-15: 1 via ORAL

## 2018-03-15 MED ORDER — FENTANYL CITRATE (PF) 100 MCG/2ML IJ SOLN
INTRAMUSCULAR | Status: AC
  Administered 2018-03-15: 25 ug via INTRAVENOUS
  Filled 2018-03-15: qty 2

## 2018-03-15 MED ORDER — BUPIVACAINE HCL (PF) 0.5 % IJ SOLN
INTRAMUSCULAR | Status: DC | PRN
  Administered 2018-03-15: 10 mL

## 2018-03-15 MED ORDER — PHENYLEPHRINE HCL 10 MG/ML IJ SOLN
INTRAMUSCULAR | Status: AC
  Filled 2018-03-15: qty 1

## 2018-03-15 MED ORDER — PROPOFOL 10 MG/ML IV BOLUS
INTRAVENOUS | Status: AC
  Filled 2018-03-15: qty 20

## 2018-03-15 MED ORDER — CLINDAMYCIN PHOSPHATE 900 MG/50ML IV SOLN
INTRAVENOUS | Status: DC | PRN
  Administered 2018-03-15: 900 mg via INTRAVENOUS

## 2018-03-15 MED ORDER — KETOROLAC TROMETHAMINE 30 MG/ML IJ SOLN: 30.0000 mg | Freq: Four times a day (QID) | INTRAMUSCULAR | Status: DC

## 2018-03-15 MED ORDER — PHENYLEPHRINE HCL 10 MG/ML IJ SOLN
INTRAMUSCULAR | Status: DC | PRN
  Administered 2018-03-15 (×3): 100 ug via INTRAVENOUS

## 2018-03-15 MED ORDER — MORPHINE SULFATE (PF) 4 MG/ML IV SOLN: 1.0000 mg | INTRAVENOUS | Status: DC | PRN

## 2018-03-15 MED ORDER — FAMOTIDINE 20 MG PO TABS
ORAL_TABLET | ORAL | Status: AC
  Administered 2018-03-15: 20 mg via ORAL
  Filled 2018-03-15: qty 1

## 2018-03-15 MED ORDER — DEXAMETHASONE SODIUM PHOSPHATE 10 MG/ML IJ SOLN
INTRAMUSCULAR | Status: AC
  Filled 2018-03-15: qty 1

## 2018-03-15 MED ORDER — OXYCODONE-ACETAMINOPHEN 5-325 MG PO TABS: 1.0000 | ORAL_TABLET | ORAL | 0 refills | Status: DC | PRN

## 2018-03-15 MED ORDER — ONDANSETRON HCL 4 MG/2ML IJ SOLN: 4.0000 mg | Freq: Once | INTRAMUSCULAR | Status: DC | PRN

## 2018-03-15 MED ORDER — FENTANYL CITRATE (PF) 100 MCG/2ML IJ SOLN
25.0000 ug | INTRAMUSCULAR | Status: DC | PRN
  Administered 2018-03-15 (×4): 25 ug via INTRAVENOUS

## 2018-03-15 MED ORDER — ACETAMINOPHEN NICU IV SYRINGE 10 MG/ML
INTRAVENOUS | Status: AC
  Filled 2018-03-15: qty 1

## 2018-03-15 MED ORDER — ROCURONIUM BROMIDE 50 MG/5ML IV SOLN
INTRAVENOUS | Status: AC
  Filled 2018-03-15: qty 1

## 2018-03-15 MED ORDER — GLYCOPYRROLATE 0.2 MG/ML IJ SOLN
INTRAMUSCULAR | Status: AC
  Filled 2018-03-15: qty 1

## 2018-03-15 MED ORDER — ACETAMINOPHEN 10 MG/ML IV SOLN
INTRAVENOUS | Status: DC | PRN
  Administered 2018-03-15: 1000 mg via INTRAVENOUS

## 2018-03-15 MED ORDER — OXYCODONE-ACETAMINOPHEN 5-325 MG PO TABS: 2.0000 | ORAL_TABLET | Freq: Four times a day (QID) | ORAL | 0 refills | Status: DC | PRN

## 2018-03-15 MED ORDER — KETOROLAC TROMETHAMINE 30 MG/ML IJ SOLN
INTRAMUSCULAR | Status: AC
  Filled 2018-03-15: qty 1

## 2018-03-15 MED ORDER — CLINDAMYCIN PHOSPHATE 900 MG/50ML IV SOLN
INTRAVENOUS | Status: AC
  Filled 2018-03-15: qty 50

## 2018-03-15 MED ORDER — HYDROMORPHONE HCL 1 MG/ML IJ SOLN
INTRAMUSCULAR | Status: AC
Start: 2018-03-15 — End: 2018-03-15
  Filled 2018-03-15: qty 1

## 2018-03-15 MED ORDER — SUGAMMADEX SODIUM 200 MG/2ML IV SOLN
INTRAVENOUS | Status: DC | PRN
  Administered 2018-03-15: 200 mg via INTRAVENOUS

## 2018-03-15 MED ORDER — KETOROLAC TROMETHAMINE 30 MG/ML IJ SOLN
INTRAMUSCULAR | Status: DC | PRN
  Administered 2018-03-15: 30 mg via INTRAVENOUS

## 2018-03-15 SURGICAL SUPPLY — 50 items
BAG URINE DRAINAGE (UROLOGICAL SUPPLIES) ×4 IMPLANT
BLADE SURG SZ11 CARB STEEL (BLADE) ×4 IMPLANT
CANISTER SUCT 1200ML W/VALVE (MISCELLANEOUS) ×4 IMPLANT
CATH FOLEY 2WAY  5CC 16FR (CATHETERS) ×2
CATH URTH 16FR FL 2W BLN LF (CATHETERS) ×2 IMPLANT
CHLORAPREP W/TINT 26ML (MISCELLANEOUS) ×4 IMPLANT
DEFOGGER SCOPE WARMER CLEARIFY (MISCELLANEOUS) ×4 IMPLANT
DERMABOND ADVANCED (GAUZE/BANDAGES/DRESSINGS) ×2
DERMABOND ADVANCED .7 DNX12 (GAUZE/BANDAGES/DRESSINGS) ×2 IMPLANT
DEVICE SUTURE ENDOST 10MM (ENDOMECHANICALS) ×4 IMPLANT
DRAPE CAMERA CLOSED 9X96 (DRAPES) ×4 IMPLANT
DRSG TEGADERM 2-3/8X2-3/4 SM (GAUZE/BANDAGES/DRESSINGS) IMPLANT
ENDOSTITCH 0 SINGLE 48 (SUTURE) IMPLANT
GLOVE BIO SURGEON STRL SZ8 (GLOVE) ×20 IMPLANT
GLOVE INDICATOR 8.0 STRL GRN (GLOVE) ×4 IMPLANT
GOWN STRL REUS W/ TWL LRG LVL3 (GOWN DISPOSABLE) ×2 IMPLANT
GOWN STRL REUS W/ TWL XL LVL3 (GOWN DISPOSABLE) ×4 IMPLANT
GOWN STRL REUS W/TWL LRG LVL3 (GOWN DISPOSABLE) ×2
GOWN STRL REUS W/TWL XL LVL3 (GOWN DISPOSABLE) ×4
GRASPER SUT TROCAR 14GX15 (MISCELLANEOUS) ×4 IMPLANT
IRRIGATION STRYKERFLOW (MISCELLANEOUS) ×2 IMPLANT
IRRIGATOR STRYKERFLOW (MISCELLANEOUS) ×4
IV LACTATED RINGERS 1000ML (IV SOLUTION) ×8 IMPLANT
KIT PINK PAD W/HEAD ARE REST (MISCELLANEOUS) ×4
KIT PINK PAD W/HEAD ARM REST (MISCELLANEOUS) ×2 IMPLANT
KIT TURNOVER CYSTO (KITS) ×4 IMPLANT
LABEL OR SOLS (LABEL) IMPLANT
MANIPULATOR VCARE LG CRV RETR (MISCELLANEOUS) IMPLANT
MANIPULATOR VCARE SML CRV RETR (MISCELLANEOUS) IMPLANT
MANIPULATOR VCARE STD CRV RETR (MISCELLANEOUS) ×4 IMPLANT
NEEDLE VERESS 14GA 120MM (NEEDLE) ×4 IMPLANT
NS IRRIG 500ML POUR BTL (IV SOLUTION) ×4 IMPLANT
OCCLUDER COLPOPNEUMO (BALLOONS) IMPLANT
PACK GYN LAPAROSCOPIC (MISCELLANEOUS) ×4 IMPLANT
PAD OB MATERNITY 4.3X12.25 (PERSONAL CARE ITEMS) ×4 IMPLANT
PAD PREP 24X41 OB/GYN DISP (PERSONAL CARE ITEMS) ×4 IMPLANT
SCISSORS METZENBAUM CVD 33 (INSTRUMENTS) ×4 IMPLANT
SET CYSTO W/LG BORE CLAMP LF (SET/KITS/TRAYS/PACK) ×4 IMPLANT
SHEARS HARMONIC ACE PLUS 36CM (ENDOMECHANICALS) ×4 IMPLANT
SLEEVE ENDOPATH XCEL 5M (ENDOMECHANICALS) ×4 IMPLANT
SPONGE GAUZE 2X2 8PLY STER LF (GAUZE/BANDAGES/DRESSINGS)
SPONGE GAUZE 2X2 8PLY STRL LF (GAUZE/BANDAGES/DRESSINGS) IMPLANT
SUT ENDO VLOC 180-0-8IN (SUTURE) ×4 IMPLANT
SUT VIC AB 0 CT1 36 (SUTURE) ×4 IMPLANT
SUT VIC AB 4-0 FS2 27 (SUTURE) ×4 IMPLANT
SYR 10ML LL (SYRINGE) ×4 IMPLANT
SYR 50ML LL SCALE MARK (SYRINGE) ×4 IMPLANT
TROCAR ENDO BLADELESS 11MM (ENDOMECHANICALS) ×4 IMPLANT
TROCAR XCEL NON-BLD 5MMX100MML (ENDOMECHANICALS) ×4 IMPLANT
TUBING INSUF HEATED (TUBING) IMPLANT

## 2018-03-15 NOTE — Anesthesia Post-op Follow-up Note (Signed)
Anesthesia QCDR form completed.        

## 2018-03-15 NOTE — Anesthesia Procedure Notes (Signed)
Procedure Name: Intubation Date/Time: 03/15/2018 10:45 AM Performed by: Nile Riggs, CRNA Pre-anesthesia Checklist: Patient identified, Emergency Drugs available, Suction available, Patient being monitored and Timeout performed Patient Re-evaluated:Patient Re-evaluated prior to induction Oxygen Delivery Method: Circle system utilized Preoxygenation: Pre-oxygenation with 100% oxygen Induction Type: IV induction and Cricoid Pressure applied Ventilation: Mask ventilation without difficulty Laryngoscope Size: Miller and 2 Grade View: Grade II Tube type: Oral Tube size: 7.0 mm Number of attempts: 2 Airway Equipment and Method: Stylet Placement Confirmation: ETT inserted through vocal cords under direct vision,  positive ETCO2,  CO2 detector and breath sounds checked- equal and bilateral Secured at: 20 cm Tube secured with: Tape Dental Injury: Teeth and Oropharynx as per pre-operative assessment  Difficulty Due To: Difficulty was anticipated Comments: Pt with small, retrognathic chin. Easy mask ventilation, but Grade IV view with MAC 3 (per [facility]). Foam pillow removed and MIL 2 utilized with success. Atraumatic.

## 2018-03-15 NOTE — Discharge Instructions (Signed)
Total Laparoscopic Hysterectomy, Care After °Refer to this sheet in the next few weeks. These instructions provide you with information on caring for yourself after your procedure. Your health care provider may also give you more specific instructions. Your treatment has been planned according to current medical practices, but problems sometimes occur. Call your health care provider if you have any problems or questions after your procedure. °What can I expect after the procedure? °· Pain and bruising at the incision sites. You will be given pain medicine to control it. °· Menopausal symptoms such as hot flashes, night sweats, and insomnia if your ovaries were removed. °· Sore throat from the breathing tube that was inserted during surgery. °Follow these instructions at home: °· Only take over-the-counter or prescription medicines for pain, discomfort, or fever as directed by your health care provider. °· Do not take aspirin. It can cause bleeding. °· Do not drive when taking pain medicine. °· Follow your health care provider's advice regarding diet, exercise, lifting, driving, and general activities. °· Resume your usual diet as directed and allowed. °· Get plenty of rest and sleep. °· Do not douche, use tampons, or have sexual intercourse for at least 6 weeks, or until your health care provider gives you permission. °· Change your bandages (dressings) as directed by your health care provider. °· Monitor your temperature and notify your health care provider of a fever. °· Take showers instead of baths for 2-3 weeks. °· Do not drink alcohol until your health care provider gives you permission. °· If you develop constipation, you may take a mild laxative with your health care provider's permission. Bran foods may help with constipation problems. Drinking enough fluids to keep your urine clear or pale yellow may help as well. °· Try to have someone home with you for 1-2 weeks to help around the house. °· Keep all of  your follow-up appointments as directed by your health care provider. °Contact a health care provider if: °· You have swelling, redness, or increasing pain around your incision sites. °· You have pus coming from your incision. °· You notice a bad smell coming from your incision. °· Your incision breaks open. °· You feel dizzy or lightheaded. °· You have pain or bleeding when you urinate. °· You have persistent diarrhea. °· You have persistent nausea and vomiting. °· You have abnormal vaginal discharge. °· You have a rash. °· You have any type of abnormal reaction or develop an allergy to your medicine. °· You have poor pain control with your prescribed medicine. °Get help right away if: °· You have chest pain or shortness of breath. °· You have severe abdominal pain that is not relieved with pain medicine. °· You have pain or swelling in your legs. °This information is not intended to replace advice given to you by your health care provider. Make sure you discuss any questions you have with your health care provider. °Document Released: 09/11/2013 Document Revised: 04/28/2016 Document Reviewed: 06/11/2013 °Elsevier Interactive Patient Education © 2017 [employer] ° °AMBULATORY SURGERY  °DISCHARGE INSTRUCTIONS ° ° °1) The drugs that you were given will stay in your system until tomorrow so for the next 24 hours you should not: ° °A) Drive an automobile °B) Make any legal decisions °C) Drink any alcoholic beverage ° ° °2) You may resume regular meals tomorrow.  Today it is better to start with liquids and gradually work up to solid foods. ° °You may eat anything you prefer, but it is better   to start with liquids, then soup and crackers, and gradually work up to solid foods. ° ° °3) Please notify your doctor immediately if you have any unusual bleeding, trouble breathing, redness and pain at the surgery site, drainage, fever, or pain not relieved by medication. ° ° ° °4) Additional Instructions: ° ° ° ° ° ° ° °Please  contact your physician with any problems or Same Day Surgery at [phone], Monday through Friday 6 am to 4 pm, or [place] [facility] [place] Main number at [phone]. ° °

## 2018-03-15 NOTE — Progress Notes (Addendum)
Peri pad has light bloody drainage approx dime  size

## 2018-03-15 NOTE — Anesthesia Postprocedure Evaluation (Signed)
Anesthesia Post Note  Patient: Meghna L Cobb  Procedure(s) Performed: HYSTERECTOMY TOTAL LAPAROSCOPIC BILATERAL SALPINGO OOPHORECTOMY (Bilateral ) CYSTOSCOPY  Patient location during evaluation: PACU Anesthesia Type: General Level of consciousness: awake and alert Pain management: pain level controlled Vital Signs Assessment: post-procedure vital signs reviewed and stable Respiratory status: spontaneous breathing, nonlabored ventilation, respiratory function stable and patient connected to nasal cannula oxygen Cardiovascular status: blood pressure returned to baseline and stable Postop Assessment: no apparent nausea or vomiting Anesthetic complications: no     Last Vitals:  Vitals:   03/15/18 1308 03/15/18 1318  BP: 106/68 96/70  Pulse: 82 82  Resp: 11 16  Temp:    SpO2: 100% 100%    Last Pain:  Vitals:   03/15/18 1318  TempSrc:   PainSc: [place]

## 2018-03-15 NOTE — Anesthesia Preprocedure Evaluation (Signed)
Anesthesia Evaluation  Patient identified by MRN, date of birth, ID band Patient awake    Reviewed: Allergy & Precautions, NPO status , Patient's Chart, lab work & pertinent test results, reviewed documented beta blocker date and time   Airway Mallampati: II  TM Distance: >3 FB     Dental  (+) Chipped   Pulmonary           Cardiovascular      Neuro/Psych  Headaches, Anxiety    GI/Hepatic   Endo/Other    Renal/GU      Musculoskeletal   Abdominal   Peds  Hematology  (+) anemia ,   Anesthesia Other Findings   Reproductive/Obstetrics                             Anesthesia Physical Anesthesia Plan  ASA: II  Anesthesia Plan: General   Post-op Pain Management:    Induction: Intravenous  PONV Risk Score and Plan:   Airway Management Planned: Oral ETT  Additional Equipment:   Intra-op Plan:   Post-operative Plan:   Informed Consent: I have reviewed the patients History and Physical, chart, labs and discussed the procedure including the risks, benefits and alternatives for the proposed anesthesia with the patient or authorized representative who has indicated his/her understanding and acceptance.     Plan Discussed with: CRNA  Anesthesia Plan Comments:         Anesthesia Quick Evaluation

## 2018-03-15 NOTE — Transfer of Care (Signed)
Immediate Anesthesia Transfer of Care Note  Patient: Sandra Nguyen  Procedure(s) Performed: HYSTERECTOMY TOTAL LAPAROSCOPIC BILATERAL SALPINGO OOPHORECTOMY (Bilateral ) CYSTOSCOPY  Patient Location: PACU  Anesthesia Type:General  Level of Consciousness: drowsy and patient cooperative  Airway & Oxygen Therapy: Patient Spontanous Breathing and Patient connected to face mask oxygen  Post-op Assessment: Report given to RN, Post -op Vital signs reviewed and stable and Patient moving all extremities  Post vital signs: Reviewed and stable  Last Vitals:  Vitals Value Taken Time  BP 114/77 03/15/2018 12:20 PM  Temp 36.4 C 03/15/2018 12:20 PM  Pulse 70 03/15/2018 12:20 PM  Resp 12 03/15/2018 12:20 PM  SpO2 100 % 03/15/2018 12:20 PM  Vitals shown include unvalidated device data.  Last Pain:  Vitals:   03/15/18 0955  TempSrc: Oral         Complications: No apparent anesthesia complications

## 2018-03-15 NOTE — Interval H&P Note (Signed)
History and Physical Interval Note:  03/15/2018 10:12 AM  Sandra Nguyen  has presented today for surgery, with the diagnosis of ABNORMAL UTERINE BLEEDING,ADVERSE EFFECT OF TAMOXIFEN, HYSTORY OF RIGHT BREAST CANCER  The various methods of treatment have been discussed with the patient and family. After consideration of risks, benefits and other options for treatment, the patient has consented to  Procedure(s): HYSTERECTOMY TOTAL LAPAROSCOPIC BILATERAL SALPINGO OOPHORECTOMY (Bilateral) as a surgical intervention .  The patient's history has been reviewed, patient examined, no change in status, stable for surgery.  I have reviewed the patient's chart and labs.  Questions were answered to the patient's satisfaction.    Hoyt Koch

## 2018-03-15 NOTE — Op Note (Signed)
Operative Report:  PRE-OP DIAGNOSIS: ABNORMAL UTERINE BLEEDING, ADVERSE EFFECT OF TAMOXIFEN, HYSTORY OF RIGHT BREAST CANCER   POST-OP DIAGNOSIS: ABNORMAL UTERINE BLEEDING, ADVERSE EFFECT OF TAMOXIFEN, HYSTORY OF RIGHT BREAST CANCER   PROCEDURE: Procedure(s): HYSTERECTOMY TOTAL LAPAROSCOPIC with  BILATERAL SALPINGO OOPHORECTOMY and CYSTOSCOPY  SURGEON: Barnett Applebaum, MD, FACOG  ASSISTANT: Dr Glennon Mac, No other capable assistant available, in surgery requiring high level assistant.  ANESTHESIA: General endotracheal anesthesia  ESTIMATED BLOOD LOSS: less than 50   (10 mL)  SPECIMENS: Uterus, Tubes, Ovaries.  COMPLICATIONS: None  DISPOSITION: stable to PACU  FINDINGS: Intraabdominal adhesions were not noted. No other mass or abnormality.  PROCEDURE:  The patient was taken to the OR where anesthesia was administed. She was prepped and draped in the normal sterile fashion in the dorsal lithotomy position in the [place] stirrups. A time out was performed. A Graves speculum was inserted, the cervix was grasped with a single tooth tenaculum and the endometrial cavity was sounded. The cervix was progressively dilated to a size 18 [place] with [employer] dilators. A V-Care uterine manipulator was inserted in the usual fashion without incident. Gloves were changed and attention was turned to the abdomen.   An infraumbilical transverse 41mm skin incision was made with the scalpel after local anesthesia applied to the skin. A Veress-step needle was inserted in the usual fashion and confirmed using the hanging drop technique. A pneumoperitoneum was obtained by insufflation of CO2 (opening pressure of 81mmHg) to 10 mmHg. A diagnostic laparoscopy was performed yielding the previously described findings. Attention was turned to the left lower quadrant where after visualization of the inferior epigastric vessels a 15mm skin incision was made with the scalpel. A 5 mm laparoscopic port was inserted. The same procedure was  repeated in the right lower quadrant with a 32mm trocar. Attention was turned to the left aspect of the uterus, where after visualization of the ureter, the round ligament was coagulated and transected using the 19mm Harmonic Scapel. The anterior and posterior leafs of the broad ligament were dissected off as the anterior one was coagulated and transected in a caudal direction towards the cuff of the uterine manipulator.  Attention was then turned to the left fallopian tube and ovary which was recognized by visualization of the fimbria. The infundibulopelvic ligament and its blood vessels were carefully coagulated and transected using the Harmonic scapel.  Attention was turned to the right aspect of the uterus where the same procedure was performed.  The vesicouterine reflection of the peritoneum was dissected with the harmonic scapel and the bladder flap was created bluntly.  The uterine vessels were coagulated and transected bilaterally using first bipolar cautery and then the harmonic scapel. A 360 degree, circumferential colpotomy was done to completely amputate the uterus with cervix and tubes. Once the specimen was amputated it was delivered through the vagina.   The colpotomy was repaired in a running fashion using a delayed absorbable suture with an endo-stitch device.  Vaginal exam confirms complete closure.  The cavity was copiously irrigated. A survey of the pelvic cavity revealed adequate hemostasis and no injury to bowel, bladder, or ureter.   A diagnostic cystoscopy was performed using saline distension of bladder with no lesions or injuries noted.  Bilateral urine flow from each ureteral orifice is visualized.  At this point the procedure was finalized. All the instruments were removed from the patient's body.  The RLQ incision (fascia) is closed with a 0-Vicryl suture using a fascia closure device. Gas was expelled and  patient is leveled.  Incisions are closed with skin adhesive.    Patient  goes to recovery room in stable condition.  All sponge, instrument, and needle counts are correct x2.     Barnett Applebaum, MD, [facility], [facility] 03/15/2018  12:09 PM

## 2018-03-16 ENCOUNTER — Encounter: Payer: Self-pay | Admitting: Obstetrics & Gynecology

## 2018-03-20 LAB — SURGICAL PATHOLOGY

## 2018-03-26 ENCOUNTER — Telehealth: Payer: Self-pay

## 2018-03-26 NOTE — Telephone Encounter (Signed)
Pt called reporting light pink discharge/bleeding hysterectomy on 03/15/18, called pt no answer left message

## 2018-04-02 ENCOUNTER — Ambulatory Visit (INDEPENDENT_AMBULATORY_CARE_PROVIDER_SITE_OTHER): Payer: BC Managed Care – PPO | Admitting: Obstetrics & Gynecology

## 2018-04-02 ENCOUNTER — Encounter: Payer: Self-pay | Admitting: Obstetrics & Gynecology

## 2018-04-02 VITALS — BP 120/80 | HR 79 | Ht 63.0 in | Wt 119.0 lb

## 2018-04-02 DIAGNOSIS — N939 Abnormal uterine and vaginal bleeding, unspecified: Secondary | ICD-10-CM

## 2018-04-02 DIAGNOSIS — Z853 Personal history of malignant neoplasm of breast: Secondary | ICD-10-CM

## 2018-04-02 NOTE — Progress Notes (Signed)
  Postoperative Follow-up Patient presents post op from [facility] BSO for Breast cancer, Tamoxifen bleeding and risks of endometrial cancer, 2 weeks ago. Images:  Pathology: DIAGNOSIS:  A. UTERUS WITH CERVIX, BILATERAL FALLOPIAN TUBES AND OVARIES;  HYSTERECTOMY WITH BILATERAL SALPINGO-OOPHORECTOMY:  - CERVIX WITHIN NORMAL LIMITS.  - PROLIFERATIVE ENDOMETRIUM.  - ADENOMYOSIS.  - AGE-RELATED CHANGE OF THE RIGHT OVARY.  - CORTICAL INCLUSION CYST OF THE LEFT OVARY.  - PARATUBAL CYST OF THE LEFT FALLOPIAN TUBE.  - METAPLASTIC CHANGE OF THE RIGHT FALLOPIAN TUBE, NEGATIVE FOR ATYPIA.   Subjective: Patient reports marked improvement in her preop symptoms. Eating a regular diet without difficulty. The patient is not having any pain.  Activity: normal activities of daily living. Patient reports vaginal sx's of None  Objective: BP 120/80   Pulse 79   Ht 5\' 3"  (1.6 m)   Wt 119 lb (54 kg)   BMI 21.08 kg/m  Physical Exam  Constitutional: She is oriented to person, place, and time. She appears well-developed and well-nourished. No distress.  Cardiovascular: Normal rate.  Pulmonary/Chest: Effort normal.  Abdominal: Soft. She exhibits no distension. There is no tenderness.  Incision Healing Well   Musculoskeletal: Normal range of motion.  Neurological: She is alert and oriented to person, place, and time. No cranial nerve deficit.  Skin: Skin is warm and dry.  Psychiatric: She has a normal mood and affect.   Assessment: s/p :  TLH BSO progressing well  Plan: Patient has done well after surgery with no apparent complications.  I have discussed the post-operative course to date, and the expected progress moving forward.  The patient understands what complications to be concerned about.  I will see the patient in routine follow up, or sooner if needed.    Activity plan: No heavy lifting. Pelvic rest. Monitor for hot flashes and menopausal sx's (min currently)  Hoyt Koch 04/02/2018,  4:23 PM

## 2018-04-09 ENCOUNTER — Inpatient Hospital Stay: Payer: BC Managed Care – PPO | Attending: Internal Medicine | Admitting: Internal Medicine

## 2018-04-09 VITALS — BP 108/74 | HR 75 | Temp 98.2°F | Resp 16 | Wt 119.0 lb

## 2018-04-09 DIAGNOSIS — C50411 Malignant neoplasm of upper-outer quadrant of right female breast: Secondary | ICD-10-CM

## 2018-04-09 DIAGNOSIS — Z9011 Acquired absence of right breast and nipple: Secondary | ICD-10-CM | POA: Diagnosis not present

## 2018-04-09 DIAGNOSIS — Z9221 Personal history of antineoplastic chemotherapy: Secondary | ICD-10-CM | POA: Diagnosis not present

## 2018-04-09 DIAGNOSIS — Z79810 Long term (current) use of selective estrogen receptor modulators (SERMs): Secondary | ICD-10-CM

## 2018-04-09 DIAGNOSIS — Z170 Estrogen receptor positive status [ER+]: Secondary | ICD-10-CM | POA: Diagnosis not present

## 2018-04-09 NOTE — Assessment & Plan Note (Addendum)
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  #Left breast tenderness-clinical exam negative mammogram January 2019 normal.  If worse to let us know.  #Since patient had bilateral oophorectomy-discussed the benefits of adding aromatase inhibitor has shown improvement in survival especially in the younger patients [less than 29 to 49 years of age].  Discussed the potential side effects of aromatase inhibitors including but not limited to osteoporosis joint aches/hot flashes.  Patient not too keen.  Let us know if she is interested in aromatase inhibitors.  # Headaches 2-3 /week-not resolved.  Prior history of migraines.  Recommend follow-up with PCP/neurologist.  # follow up in 4 months/labs.    Cc; Theora Gianotti; [place].

## 2018-04-09 NOTE — Progress Notes (Signed)
[place] OFFICE PROGRESS NOTE  Patient Care Team: [name], MD as PCP - General (Family Medicine) Dear, [name], MD (Inactive) (Family Medicine) Bary Castilla, Forest Gleason, MD (General Surgery) Forest Gleason, MD (Unknown Physician Specialty) Thornton Park, MD as Consulting Physician (Orthopedic Surgery)  Cancer Staging Carcinoma of upper-outer quadrant of right breast in female, estrogen receptor positive [facility]) Staging form: Breast, AJCC 7th Edition - Clinical: Stage IIA (T2, N0, M0) - Signed by Forest Gleason, MD on 05/09/2015    Oncology History   1. Carcinoma breast (right breast) 2 cm palpable mass Invasive mammary carcinoma.  Estrogen receptor positive.  Progesterone receptor positive.  HER-2/neu receptor amplified. 2. Started on neoadjuvant chemotherapy,  Taxotere, carboplatin and Herceptin and perjeta (March 05, 2014) 3. Patient does not have any clinically significant BRCA mutation Has been on and to [place] Mutation of unknown significance. . 4. 4 cycles of chemotherapy with TCH,perjeta previous did not June of 201 5.  Because of progressing side effect remaining 2 cycles of chemotherapy was not given but patient continued Herceptin and aperjeta  5. Status post right breast simple mastectomy in August of 2015 ypT1  ypN0 stage IB 6.patient started on tamoxifen in October of 2015 along with maintenance Herceptin therapy Patient is going for reconstructive surgery on October 21, 2014 final reconstructive surgery was done in February of 2016 [name] finished Herceptin therapy on March 11, 2015  # TAH & BSO [Dr.Harris; April 2019- menorrhagia]     Carcinoma of upper-outer quadrant of right breast in female, estrogen receptor positive ([place])     INTERVAL HISTORY:  Sandra Nguyen 47 y.o.  female right-sided stage II ER PR positive HER-2/neu positive status post mastectomy currently on tamoxifen is here for follow-up.  In the interim patient underwent TAH plus BSO in  April 2019 given history of menorrhagia.  Postoperative course was uneventful.  Patient continues to have intermittent headaches after 2-3 a week.  Prior history of migraines.  She is not on medications.  Denies any focal deficit.  Patient also continues to have intermittent left breast tenderness; however no new masses felt.   Review of Systems  Constitutional: Negative.   HENT: Negative.   Eyes: Negative.   Respiratory: Negative.   Cardiovascular: Negative.   Gastrointestinal: Negative.   Genitourinary: Negative.   Musculoskeletal: Negative.   Skin: Negative.   Neurological: Positive for headaches.  Endo/Heme/Allergies: Negative.   Psychiatric/Behavioral: Negative.      REVIEW OF SYSTEMS:  A complete 10 point review of system is done which is negative except mentioned above/history of present illness.   PAST MEDICAL HISTORY :  Past Medical History:  Diagnosis Date  . Anemia   . Anxiety   . Atypical squamous cell changes of undetermined significance (ASCUS) on cervical cytology with negative high risk human papilloma virus (HPV) test result 06/20/2016  . Breast cancer [facility]) March 2015    Clinical T1c, N0, BRCA negative- Right  . Dysplasia of cervix, low grade (CIN 1)   . 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 Malignant neoplasm of upper-outer quadrant of female breast [facility]) August 2015   pT1b, N0; M0. 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 Menorrhagia   . Migraines    migraines  . Vitamin D deficiency     PAST SURGICAL HISTORY :   Past Surgical History:  Procedure Laterality Date  . AUGMENTATION MAMMAPLASTY Bilateral Nov 2016   Dr Tula Nakayama REMOVED 07/2016  . BREAST BIOPSY Right  02/2014   positive  . BREAST RECONSTRUCTION Right 04/14/2015   Procedure: AEROLA/NIPPLE RECONSTRUCTION WITH GRAFT;  Surgeon: Nicholaus Bloom, MD;  Location: [place];  Service: Plastics;  Laterality: Right;  . BREAST SURGERY Right July 10, 2014   Right simple mastectomy with sentinel node biopsy.  . CERVICAL BIOPSY  W/ LOOP ELECTRODE EXCISION  1998  . COLONOSCOPY WITH PROPOFOL N/A 05/04/2016   Procedure: COLONOSCOPY WITH PROPOFOL;  Surgeon: Robert Bellow, MD;  Location: [facility] ENDOSCOPY;  Service: Endoscopy;  Laterality: N/A;  . CYSTOSCOPY  03/15/2018   Procedure: CYSTOSCOPY;  Surgeon: Gae Dry, MD;  Location: [facility] ORS;  Service: Gynecology;;  . DILATATION & CURETTAGE/HYSTEROSCOPY WITH MYOSURE N/A 08/03/2017   Procedure: [place];  Surgeon: Gae Dry, MD;  Location: [facility] ORS;  Service: Gynecology;  Laterality: N/A;  . ESSURE TUBAL LIGATION  09/15/2010  . EXCISIONAL HEMORRHOIDECTOMY  2015  . I and D of hemorrhoid  05/2017   Dr. Jamal Collin  . LAPAROSCOPIC HYSTERECTOMY Bilateral 03/15/2018   Procedure: HYSTERECTOMY TOTAL LAPAROSCOPIC BILATERAL SALPINGO OOPHORECTOMY;  Surgeon: Gae Dry, MD;  Location: [facility] ORS;  Service: Gynecology;  Laterality: Bilateral;  . MASTECTOMY Right 07/2014   chemo and herceptin tx  . PORTACATH PLACEMENT    . TUBAL LIGATION      FAMILY HISTORY :   Family History  Problem Relation Age of Onset  . Cervical cancer Maternal Aunt 65  . Hypertension Mother   . Heart disease Maternal Grandmother   . Breast cancer Paternal Aunt 41  . Ovarian cancer Neg Hx   . Colon cancer Neg Hx     SOCIAL HISTORY:   Social History   Tobacco Use  . Smoking status: Never Smoker  . Smokeless tobacco: Never Used  Substance Use Topics  . Alcohol use: No  . Drug use: No    ALLERGIES:  is allergic to keflex [cephalexin].  MEDICATIONS:  Current Outpatient Medications  Medication Sig Dispense Refill  . Biotin 10000 MCG TABS Take 10,000 mcg by mouth at bedtime.    . clonazePAM (KLONOPIN) 0.5 MG tablet Take 1 mg by mouth at bedtime.     Marland Kitchen levocetirizine (XYZAL) 5 MG tablet Take 5 mg by mouth every evening.     . tamoxifen (NOLVADEX) 20 MG tablet Take 1  tablet (20 mg total) by mouth daily. 30 tablet 11  . venlafaxine XR (EFFEXOR-XR) 37.5 MG 24 hr capsule Take 75 mg by mouth daily with breakfast.     . Vitamin D, Ergocalciferol, (DRISDOL) 50000 units CAPS capsule TAKE ONE CAPSULE BY MOUTH ONE TIME PER WEEK 12 capsule 1   No current facility-administered medications for this visit.     PHYSICAL EXAMINATION: ECOG PERFORMANCE STATUS: 0 - Asymptomatic  BP 108/74 (BP Location: Left Arm, Patient Position: Sitting)   Pulse 75   Temp 98.2 F (36.8 C) (Tympanic)   Resp 16   Wt 119 lb (54 kg)   BMI 21.08 kg/m   Filed Weights   04/09/18 0959  Weight: 119 lb (54 kg)   GENERAL: Well-nourished well-developed; Alert, no distress and comfortable.  Alone. EYES: no pallor or icterus OROPHARYNX: no thrush or ulceration; NECK: supple; no lymph nodes felt. LYMPH:  no palpable lymphadenopathy in the axillary or inguinal regions LUNGS: Decreased breath sounds auscultation bilaterally. No wheeze or crackles HEART/CVS: regular rate & rhythm and no murmurs; No lower extremity edema ABDOMEN:abdomen soft, non-tender and normal bowel sounds. No hepatomegaly or splenomegaly.  Musculoskeletal:no cyanosis of digits and no clubbing  PSYCH: alert & oriented x 3 with fluent speech NEURO: no focal motor/sensory deficits SKIN:  no rashes or significant lesions Right and left BREAST exam [in the presence of nurse]- no unusual skin changes or dominant masses felt. Surgical scars noted.  Right mastectomy noted  LABORATORY DATA:  I have reviewed the data as listed    Component Value Date/Time   NA 137 12/11/2017 0928   NA 143 12/31/2014 1313   K 3.6 12/11/2017 0928   K 3.5 12/31/2014 1313   CL 104 12/11/2017 0928   CL 110 (H) 12/31/2014 1313   CO2 26 12/11/2017 0928   CO2 28 12/31/2014 1313   GLUCOSE 121 (H) 12/11/2017 0928   GLUCOSE 124 (H) 12/31/2014 1313   BUN 18 12/11/2017 0928   BUN 13 12/31/2014 1313   CREATININE 0.99 12/11/2017 0928   CREATININE  0.89 12/31/2014 1313   CALCIUM 8.9 12/11/2017 0928   CALCIUM 8.4 (L) 12/31/2014 1313   PROT 7.1 12/11/2017 0928   PROT 6.9 12/31/2014 1313   ALBUMIN 3.9 12/11/2017 0928   ALBUMIN 3.5 12/31/2014 1313   AST 23 12/11/2017 0928   AST 21 12/31/2014 1313   ALT 10 (L) 12/11/2017 0928   ALT 23 12/31/2014 1313   ALKPHOS 55 12/11/2017 0928   ALKPHOS 66 12/31/2014 1313   BILITOT 0.4 12/11/2017 0928   BILITOT 0.2 12/31/2014 1313   GFRNONAA >60 12/11/2017 0928   GFRNONAA >60 12/31/2014 1313   GFRNONAA >60 07/16/2014 1351   GFRAA >60 12/11/2017 0928   GFRAA >60 12/31/2014 1313   GFRAA >60 07/16/2014 1351    No results found for: SPEP, UPEP  Lab Results  Component Value Date   WBC 3.9 03/12/2018   NEUTROABS 3.5 12/11/2017   HGB 11.5 (L) 03/12/2018   HCT 36.0 03/12/2018   MCV 87.4 03/12/2018   PLT 208 03/12/2018      Chemistry      Component Value Date/Time   NA 137 12/11/2017 0928   NA 143 12/31/2014 1313   K 3.6 12/11/2017 0928   K 3.5 12/31/2014 1313   CL 104 12/11/2017 0928   CL 110 (H) 12/31/2014 1313   CO2 26 12/11/2017 0928   CO2 28 12/31/2014 1313   BUN 18 12/11/2017 0928   BUN 13 12/31/2014 1313   CREATININE 0.99 12/11/2017 0928   CREATININE 0.89 12/31/2014 1313      Component Value Date/Time   CALCIUM 8.9 12/11/2017 0928   CALCIUM 8.4 (L) 12/31/2014 1313   ALKPHOS 55 12/11/2017 0928   ALKPHOS 66 12/31/2014 1313   AST 23 12/11/2017 0928   AST 21 12/31/2014 1313   ALT 10 (L) 12/11/2017 0928   ALT 23 12/31/2014 1313   BILITOT 0.4 12/11/2017 0928   BILITOT 0.2 12/31/2014 1313       RADIOGRAPHIC STUDIES: I have personally reviewed the radiological images as listed and agreed with the findings in the report. No results found.   ASSESSMENT & PLAN:  Carcinoma of upper-outer quadrant of right breast in female, estrogen receptor positive ([place]) #Stage II ER PR positive HER-2/neu positive right breast cancer; clinically no evidence of recurrence.  #Left breast  tenderness-clinical exam negative mammogram January 2019 normal.  If worse to let us know.  #Since patient had bilateral oophorectomy-discussed the benefits of adding aromatase inhibitor has shown improvement in survival especially in the younger patients [less than 68 to 49 years of age].  Discussed the potential side  effects of aromatase inhibitors including but not limited to osteoporosis joint aches/hot flashes.  Patient not too keen.  Let us know if she is interested in aromatase inhibitors.  # Headaches 2-3 /week-not resolved.  Prior history of migraines.  Recommend follow-up with PCP/neurologist.  # follow up in 4 months/labs.    Cc; Theora Gianotti; [place].    Orders Placed This Encounter  Procedures  . CBC with Differential/Platelet    Standing Status:   Future    Standing Expiration Date:   04/10/2019  . Comprehensive metabolic panel    Standing Status:   Future    Standing Expiration Date:   04/10/2019  . Cancer antigen 27.29    Standing Status:   Future    Standing Expiration Date:   04/09/2019      Cammie Sickle, MD 04/09/2018 1:06 PM

## 2018-05-02 ENCOUNTER — Encounter: Payer: Self-pay | Admitting: Obstetrics & Gynecology

## 2018-05-02 ENCOUNTER — Ambulatory Visit (INDEPENDENT_AMBULATORY_CARE_PROVIDER_SITE_OTHER): Payer: BC Managed Care – PPO | Admitting: Obstetrics & Gynecology

## 2018-05-02 ENCOUNTER — Encounter (HOSPITAL_COMMUNITY): Payer: Self-pay

## 2018-05-02 VITALS — BP 100/60 | HR 89 | Ht 63.0 in | Wt 120.0 lb

## 2018-05-02 DIAGNOSIS — N939 Abnormal uterine and vaginal bleeding, unspecified: Secondary | ICD-10-CM

## 2018-05-02 DIAGNOSIS — Z853 Personal history of malignant neoplasm of breast: Secondary | ICD-10-CM

## 2018-05-02 NOTE — Progress Notes (Addendum)
  Postoperative Follow-up Patient presents post op from [place] for history of breast cancer on Tamoxifen with abnormal uterine bleeding and concern for the development of endometriasl carcinoma, 6 weeks ago.  Subjective: Patient reports marked improvement in her preop symptoms. Eating a regular diet without difficulty. The patient is not having any pain.  Activity: normal activities of daily living. Patient reports vaginal sx's of scant bleeding yesterday.  Objective: BP 100/60   Pulse 89   Ht 5\' 3"  (1.6 m)   Wt 120 lb (54.4 kg)   LMP 02/15/2018   BMI 21.26 kg/m  Physical Exam  Constitutional: She is oriented to person, place, and time. She appears well-developed and well-nourished. No distress.  Genitourinary: Rectum normal and vagina normal. Pelvic exam was performed with patient supine. There is no rash, tenderness or lesion on the right labia. There is no rash, tenderness or lesion on the left labia. No erythema or bleeding in the vagina. Right adnexum does not display mass and does not display tenderness. Left adnexum does not display mass and does not display tenderness.  Genitourinary Comments: Cervix and uterus absent. Vaginal cuff healing well, no separation or granulation tissue  Cardiovascular: Normal rate.  Pulmonary/Chest: Effort normal.  Abdominal: Soft. She exhibits no distension. There is no tenderness.  Incision healing well.  Musculoskeletal: Normal range of motion.  Neurological: She is alert and oriented to person, place, and time. No cranial nerve deficit.  Skin: Skin is warm and dry.  Psychiatric: She has a normal mood and affect.   Assessment: s/p :  TLH BSO Cysto progressing well  Plan: Patient has done well after surgery with no apparent complications.  I have discussed the post-operative course to date, and the expected progress moving forward.  The patient understands what complications to be concerned about.  I will see the patient in routine follow  up, or sooner if needed.    Activity plan: No restriction.  Hoyt Koch 05/02/2018, 8:19 AM

## 2018-05-02 NOTE — Progress Notes (Signed)
Social worker provided counseling to patient at the cancer center. 

## 2018-05-30 ENCOUNTER — Encounter (HOSPITAL_COMMUNITY): Payer: Self-pay

## 2018-05-30 NOTE — Progress Notes (Signed)
Social worker met with patient at the Cancer center on 05/02/2018 and provided counseling services.

## 2018-05-30 NOTE — Progress Notes (Signed)
Social worker met with patient at the cancer center to provide counseling services.

## 2018-07-19 ENCOUNTER — Encounter (HOSPITAL_COMMUNITY): Payer: Self-pay

## 2018-07-19 NOTE — Progress Notes (Signed)
Social worker provided counseling services at the cancer center. Follow up scheduled for Aug 22.

## 2018-07-26 ENCOUNTER — Encounter (HOSPITAL_COMMUNITY): Payer: Self-pay

## 2018-07-26 NOTE — Progress Notes (Signed)
Social worker provided counseling services to patient at the cancer center. 

## 2018-08-02 ENCOUNTER — Encounter (HOSPITAL_COMMUNITY): Payer: Self-pay

## 2018-08-02 NOTE — Progress Notes (Unsigned)
Social worker provided counseling to patient in the cancer center. 

## 2018-08-07 ENCOUNTER — Encounter (HOSPITAL_COMMUNITY): Payer: Self-pay

## 2018-08-07 NOTE — Progress Notes (Signed)
Social worker provided counseling services to patient at the cancer center. 

## 2018-08-11 ENCOUNTER — Other Ambulatory Visit: Payer: Self-pay | Admitting: Oncology

## 2018-08-13 ENCOUNTER — Inpatient Hospital Stay (HOSPITAL_BASED_OUTPATIENT_CLINIC_OR_DEPARTMENT_OTHER): Payer: BC Managed Care – PPO | Admitting: Internal Medicine

## 2018-08-13 ENCOUNTER — Encounter (HOSPITAL_COMMUNITY): Payer: Self-pay

## 2018-08-13 ENCOUNTER — Inpatient Hospital Stay: Payer: BC Managed Care – PPO | Attending: Internal Medicine

## 2018-08-13 VITALS — BP 101/68 | HR 60 | Temp 97.8°F | Resp 18 | Ht 63.0 in | Wt 116.0 lb

## 2018-08-13 DIAGNOSIS — R634 Abnormal weight loss: Secondary | ICD-10-CM | POA: Diagnosis not present

## 2018-08-13 DIAGNOSIS — Z9882 Breast implant status: Secondary | ICD-10-CM | POA: Diagnosis not present

## 2018-08-13 DIAGNOSIS — Z170 Estrogen receptor positive status [ER+]: Secondary | ICD-10-CM | POA: Diagnosis not present

## 2018-08-13 DIAGNOSIS — R51 Headache: Secondary | ICD-10-CM

## 2018-08-13 DIAGNOSIS — Z79811 Long term (current) use of aromatase inhibitors: Secondary | ICD-10-CM | POA: Insufficient documentation

## 2018-08-13 DIAGNOSIS — Z800 Family history of malignant neoplasm of digestive organs: Secondary | ICD-10-CM | POA: Diagnosis not present

## 2018-08-13 DIAGNOSIS — Z8049 Family history of malignant neoplasm of other genital organs: Secondary | ICD-10-CM

## 2018-08-13 DIAGNOSIS — Z9221 Personal history of antineoplastic chemotherapy: Secondary | ICD-10-CM

## 2018-08-13 DIAGNOSIS — Z803 Family history of malignant neoplasm of breast: Secondary | ICD-10-CM | POA: Diagnosis not present

## 2018-08-13 DIAGNOSIS — Z8041 Family history of malignant neoplasm of ovary: Secondary | ICD-10-CM | POA: Insufficient documentation

## 2018-08-13 DIAGNOSIS — C50411 Malignant neoplasm of upper-outer quadrant of right female breast: Secondary | ICD-10-CM | POA: Insufficient documentation

## 2018-08-13 DIAGNOSIS — E559 Vitamin D deficiency, unspecified: Secondary | ICD-10-CM | POA: Insufficient documentation

## 2018-08-13 DIAGNOSIS — Z9011 Acquired absence of right breast and nipple: Secondary | ICD-10-CM | POA: Insufficient documentation

## 2018-08-13 DIAGNOSIS — Z90722 Acquired absence of ovaries, bilateral: Secondary | ICD-10-CM | POA: Diagnosis not present

## 2018-08-13 DIAGNOSIS — Z90710 Acquired absence of both cervix and uterus: Secondary | ICD-10-CM

## 2018-08-13 LAB — CBC WITH DIFFERENTIAL/PLATELET
Basophils Absolute: 0 10*3/uL (ref 0–0.1)
Basophils Relative: 0 %
EOS ABS: 0.1 10*3/uL (ref 0–0.7)
EOS PCT: 2 %
HCT: 32.5 % — ABNORMAL LOW (ref 35.0–47.0)
HEMOGLOBIN: 10.7 g/dL — AB (ref 12.0–16.0)
LYMPHS ABS: 1.5 10*3/uL (ref 1.0–3.6)
Lymphocytes Relative: 37 %
MCH: 28.6 pg (ref 26.0–34.0)
MCHC: 32.9 g/dL (ref 32.0–36.0)
MCV: 86.8 fL (ref 80.0–100.0)
MONO ABS: 0.3 10*3/uL (ref 0.2–0.9)
MONOS PCT: 8 %
NEUTROS PCT: 53 %
Neutro Abs: 2.3 10*3/uL (ref 1.4–6.5)
PLATELETS: 171 10*3/uL (ref 150–440)
RBC: 3.75 MIL/uL — ABNORMAL LOW (ref 3.80–5.20)
RDW: 13.7 % (ref 11.5–14.5)
WBC: 4.2 10*3/uL (ref 3.6–11.0)

## 2018-08-13 LAB — COMPREHENSIVE METABOLIC PANEL
ALK PHOS: 39 U/L (ref 38–126)
ALT: 11 U/L (ref 0–44)
AST: 23 U/L (ref 15–41)
Albumin: 3.9 g/dL (ref 3.5–5.0)
Anion gap: 7 (ref 5–15)
BUN: 13 mg/dL (ref 6–20)
CHLORIDE: 107 mmol/L (ref 98–111)
CO2: 27 mmol/L (ref 22–32)
CREATININE: 0.93 mg/dL (ref 0.44–1.00)
Calcium: 8.9 mg/dL (ref 8.9–10.3)
Glucose, Bld: 112 mg/dL — ABNORMAL HIGH (ref 70–99)
Potassium: 3.1 mmol/L — ABNORMAL LOW (ref 3.5–5.1)
SODIUM: 141 mmol/L (ref 135–145)
Total Bilirubin: 0.4 mg/dL (ref 0.3–1.2)
Total Protein: 6.4 g/dL — ABNORMAL LOW (ref 6.5–8.1)

## 2018-08-13 MED ORDER — MIRTAZAPINE 15 MG PO TABS: 30.0000 mg | ORAL_TABLET | Freq: Every day | ORAL | 3 refills | Status: DC

## 2018-08-13 MED ORDER — VITAMIN D (ERGOCALCIFEROL) 1.25 MG (50000 UNIT) PO CAPS: ORAL_CAPSULE | ORAL | 2 refills | Status: DC

## 2018-08-13 MED ORDER — ANASTROZOLE 1 MG PO TABS: 1.0000 mg | ORAL_TABLET | Freq: Every day | ORAL | 3 refills | Status: DC

## 2018-08-13 NOTE — Progress Notes (Signed)
Nutrition Assessment   Reason for Assessment:   Verbal referral from Dr. B regarding weight loss.    ASSESSMENT:   47 year old female with breast cancer s/p mastectomy on tamoxifen.  Past medical history of TAH plus BSO April 2019, migraines, Vit D deficiency, anxiety  Met with patient following MD visit.  Patient reports that she lost her father in July and has been going through marital issues.  Patient reports poor appetite with all this going on.  Noted patient is receiving counseling services from [facility].  Patient reports typically has smoothie with ensure protein max and yogurt for breakfast.  Yesterday had smoothie then 3 chicken strips from [employer] and french fries.  This am had biscuit with egg, cheese and bacon (ate 100%) and plans to get chick fi lay meal for lunch (tries to eat it all but does not always).  Drinks [employer].    No problems with nausea, vomiting, chewing, swallowing, constipation, diarrhea.   Reports has regular BM daily.     Nutrition Focused Physical Exam: deferred   Medications: biotin, VIt D,    Labs: K 3.1   Anthropometrics:   Height: 63 inches Weight: 116 lb today UBW: 130 lb Noted 127 lb in Jan 2019 BMI: 20  Patient reports want to get to 130-135 lb  11% weight loss in the last 8 months   Estimated Energy Needs  Kcals: 1325-1600 kcals/d Protein: 66-80 g/d Fluid: 1.6 L/d   NUTRITION DIAGNOSIS: Inadequate oral intake related to social stressors as evidenced by 11% weight loss in the last 8 months   INTERVENTION:  Discussed calorie goal to promote weight loss.  Discussed how to read food labels for calories.  Discussed ways to increase calories and protein to reach goals. Encouraged high calorie, high protein shakes to increase calories and protein.  Samples and coupons given today Dr B starting remeron today. Contact information provided   MONITORING, EVALUATION, GOAL: Patient will consume adequate calories  to prevent further weight loss.   Next Visit: October 21 following MD visit  Aqeel Norgaard B. Zenia Resides, [place], [place] Registered Dietitian [phone] (pager)

## 2018-08-13 NOTE — Telephone Encounter (Signed)
Patient is being evaluated today in the clinic. She is on Vit D due to boney pain r/t AI use.

## 2018-08-13 NOTE — Telephone Encounter (Signed)
patient was evaluated in the office today. RF submitted.

## 2018-08-13 NOTE — Assessment & Plan Note (Addendum)
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  #Patient tolerating tamoxifen poorly [see discussion below]; recommend starting anastrozole 1 mg a day [patient had BSO].  Again reviewed the potential side effects of joint aches osteoporosis and heart fractures.  #Headaches improved.  #Possible weight loss/anxiety/depression [recently separated/father's death]-no concerns for malignancy causing weight loss.  Recommend Remeron 15 mg once a night.  Discussed the potential side effects of drowsiness.   #Body aches joint aches-continue vitamin D 50,000 units every week.  # follow up in 2 monts/no labs  Cc; Theora Gianotti; [place].

## 2018-08-13 NOTE — Progress Notes (Signed)
[facility] OFFICE PROGRESS NOTE  Patient Care Team: [name], MD as PCP - General (Family Medicine) Dear, [name], MD (Inactive) (Family Medicine) Bary Castilla, Forest Gleason, MD (General Surgery) Forest Gleason, MD (Inactive) (Unknown Physician Specialty) Thornton Park, MD as Consulting Physician (Orthopedic Surgery)  Cancer Staging Carcinoma of upper-outer quadrant of right breast in female, estrogen receptor positive [facility]) Staging form: Breast, AJCC 7th Edition - Clinical: Stage IIA (T2, N0, M0) - Signed by Forest Gleason, MD on 05/09/2015    Oncology History   1. 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  Estrogen receptor positive.  Progesterone receptor positive.  HER-2/neu receptor amplified. 2. Started on neoadjuvant chemotherapy,  Taxotere, carboplatin and Herceptin and perjeta (March 05, 2014) 3. Patient does not have any clinically significant BRCA mutation Has been on and to [place] Mutation of unknown significance. . 4. 4 cycles of chemotherapy with TCH,perjeta previous did not June of 201 5.  Because of progressing side effect remaining 2 cycles of chemotherapy was not given but patient continued Herceptin and aperjeta  5. Status post right breast simple mastectomy in August of 2015 ypT1  ypN0 stage IB 6.patient started on tamoxifen in October of 2015 along with maintenance Herceptin therapy Patient is going for reconstructive surgery on October 21, 2014 final reconstructive surgery was done in February of 2016 [name] finished Herceptin therapy on March 11, 2015  # Sep 2019- STOP TAM; start Arimidex 75m/day.   # TAH & BSO [Dr.Harris; April 2019- menorrhagia] -------------------------------------------------    DIAGNOSIS: Breast cancer  STAGE:   II     ;GOALS: Cure  CURRENT/MOST RECENT THERAPY: Aromatase inhibitor      Carcinoma of upper-outer quadrant of right breast in female, estrogen receptor positive [place]    INTERVAL HISTORY:  Havanna L Cobb 47y.o.  female right-sided stage II ER PR positive HER-2/neu positive status post mastectomy currently on tamoxifen is here for follow-up.  In the interim patient underwent TAH plus BSO in April 2019 given history of menorrhagia.  Postoperative course was uneventful.  Patient continues to have intermittent headaches after 2-3 a week.  Prior history of migraines.  She is not on medications.  Denies any focal deficit.  Patient also continues to have intermittent left breast tenderness; however no new masses felt.   Review of Systems  Constitutional: Positive for malaise/fatigue and weight loss. Negative for chills, diaphoresis and fever.  HENT: Negative.  Negative for nosebleeds and sore throat.   Eyes: Negative.  Negative for double vision.  Respiratory: Negative.  Negative for cough, hemoptysis, sputum production, shortness of breath and wheezing.   Cardiovascular: Negative.  Negative for chest pain, palpitations, orthopnea and leg swelling.  Gastrointestinal: Negative.  Negative for abdominal pain, blood in stool, constipation, diarrhea, heartburn, melena, nausea and vomiting.  Genitourinary: Negative.  Negative for dysuria, frequency and urgency.  Musculoskeletal: Positive for joint pain and myalgias. Negative for back pain.  Skin: Negative.  Negative for itching and rash.  Neurological: Negative for dizziness, tingling, focal weakness and weakness.  Endo/Heme/Allergies: Negative.  Does not bruise/bleed easily.  Psychiatric/Behavioral: Negative.  Negative for depression. The patient is not nervous/anxious and does not have insomnia.      REVIEW OF SYSTEMS:  A complete 10 point review of system is done which is negative except mentioned above/history of present illness.   PAST MEDICAL HISTORY :  Past Medical History:  Diagnosis Date  . Anemia   . Anxiety   .  Atypical squamous cell changes of undetermined significance (ASCUS) on cervical  cytology with negative high risk human papilloma virus (HPV) test result 06/20/2016  . Breast cancer [facility]) March 2015    Clinical T1c, N0, BRCA negative- Right  . Dysplasia of cervix, low grade (CIN 1)   . 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 Malignant neoplasm of upper-outer quadrant of female breast [facility]) August 2015   pT1b, N0; M0. 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 Menorrhagia   . Migraines    migraines  . Vitamin D deficiency     PAST SURGICAL HISTORY :   Past Surgical History:  Procedure Laterality Date  . AUGMENTATION MAMMAPLASTY Bilateral Nov 2016   Dr Tula Nakayama REMOVED 07/2016  . BREAST BIOPSY Right 02/2014   positive  . BREAST RECONSTRUCTION Right 04/14/2015   Procedure: AEROLA/NIPPLE RECONSTRUCTION WITH GRAFT;  Surgeon: Nicholaus Bloom, MD;  Location: [place];  Service: Plastics;  Laterality: Right;  . BREAST SURGERY Right July 10, 2014   Right simple mastectomy with sentinel node biopsy.  . CERVICAL BIOPSY  W/ LOOP ELECTRODE EXCISION  1998  . COLONOSCOPY WITH PROPOFOL N/A 05/04/2016   Procedure: COLONOSCOPY WITH PROPOFOL;  Surgeon: Robert Bellow, MD;  Location: [facility] ENDOSCOPY;  Service: Endoscopy;  Laterality: N/A;  . CYSTOSCOPY  03/15/2018   Procedure: CYSTOSCOPY;  Surgeon: Gae Dry, MD;  Location: [facility] ORS;  Service: Gynecology;;  . DILATATION & CURETTAGE/HYSTEROSCOPY WITH MYOSURE N/A 08/03/2017   Procedure: [place];  Surgeon: Gae Dry, MD;  Location: [facility] ORS;  Service: Gynecology;  Laterality: N/A;  . ESSURE TUBAL LIGATION  09/15/2010  . EXCISIONAL HEMORRHOIDECTOMY  2015  . I and D of hemorrhoid  05/2017   Dr. Jamal Collin  . LAPAROSCOPIC HYSTERECTOMY Bilateral 03/15/2018   Procedure: HYSTERECTOMY TOTAL LAPAROSCOPIC BILATERAL SALPINGO OOPHORECTOMY;  Surgeon: Gae Dry, MD;  Location: [facility] ORS;  Service: Gynecology;  Laterality: Bilateral;  .  MASTECTOMY Right 07/2014   chemo and herceptin tx  . PORTACATH PLACEMENT    . TUBAL LIGATION      FAMILY HISTORY :   Family History  Problem Relation Age of Onset  . Cervical cancer Maternal Aunt 65  . Hypertension Mother   . Heart disease Maternal Grandmother   . Breast cancer Paternal Aunt 33  . Ovarian cancer Neg Hx   . Colon cancer Neg Hx     SOCIAL HISTORY:   Social History   Tobacco Use  . Smoking status: Never Smoker  . Smokeless tobacco: Never Used  Substance Use Topics  . Alcohol use: No  . Drug use: No    ALLERGIES:  is allergic to cephalexin.  MEDICATIONS:  Current Outpatient Medications  Medication Sig Dispense Refill  . Biotin 10000 MCG TABS Take 10,000 mcg by mouth at bedtime.    . clonazePAM (KLONOPIN) 0.5 MG tablet Take 1 mg by mouth at bedtime.     Marland Kitchen levocetirizine (XYZAL) 5 MG tablet Take 5 mg by mouth every evening.     . tamoxifen (NOLVADEX) 20 MG tablet Take 1 tablet (20 mg total) by mouth daily. 30 tablet 11  . Vitamin D, Ergocalciferol, (DRISDOL) 50000 units CAPS capsule TAKE ONE CAPSULE BY MOUTH ONE TIME PER WEEK 12 capsule 2  . anastrozole (ARIMIDEX) 1 MG tablet Take 1 tablet (1 mg total) by mouth daily. 30 tablet 3  . mirtazapine (REMERON) 15 MG tablet Take 2 tablets (30 mg total) by mouth at  bedtime. 30 tablet 3   No current facility-administered medications for this visit.     PHYSICAL EXAMINATION: ECOG PERFORMANCE STATUS: 0 - Asymptomatic  BP 101/68 (BP Location: Left Arm, Patient Position: Sitting)   Pulse 60   Temp 97.8 F (36.6 C) (Tympanic)   Resp 18   Ht _0  (1.6 m)   Wt 116 lb (52.6 kg)   LMP 02/15/2018   BMI 20.55 kg/m   Filed Weights   08/13/18 1015  Weight: 116 lb (52.6 kg)   Physical Exam  Constitutional: She is oriented to person, place, and time and well-developed, well-nourished, and in no distress.  HENT:  Head: Normocephalic and atraumatic.  Mouth/Throat: Oropharynx is clear and moist. No oropharyngeal  exudate.  Eyes: Pupils are equal, round, and reactive to light.  Neck: Normal range of motion. Neck supple.  Cardiovascular: Normal rate and regular rhythm.  Pulmonary/Chest: No respiratory distress. She has no wheezes.  Abdominal: Soft. Bowel sounds are normal. She exhibits no distension and no mass. There is no tenderness. There is no rebound and no guarding.  Musculoskeletal: Normal range of motion. She exhibits no edema or tenderness.  Neurological: She is alert and oriented to person, place, and time.  Skin: Skin is warm.  Right and left BREAST exam (in the presence of nurse)- no unusual skin changes or dominant masses felt. Surgical scars noted. Right mastectomy noted.    Psychiatric: Affect normal.     LABORATORY DATA:  I have reviewed the data as listed    Component Value Date/Time   NA 141 08/13/2018 0930   NA 143 12/31/2014 1313   K 3.1 (L) 08/13/2018 0930   K 3.5 12/31/2014 1313   CL 107 08/13/2018 0930   CL 110 (H) 12/31/2014 1313   CO2 27 08/13/2018 0930   CO2 28 12/31/2014 1313   GLUCOSE 112 (H) 08/13/2018 0930   GLUCOSE 124 (H) 12/31/2014 1313   BUN 13 08/13/2018 0930   BUN 13 12/31/2014 1313   CREATININE 0.93 08/13/2018 0930   CREATININE 0.89 12/31/2014 1313   CALCIUM 8.9 08/13/2018 0930   CALCIUM 8.4 (L) 12/31/2014 1313   PROT 6.4 (L) 08/13/2018 0930   PROT 6.9 12/31/2014 1313   ALBUMIN 3.9 08/13/2018 0930   ALBUMIN 3.5 12/31/2014 1313   AST 23 08/13/2018 0930   AST 21 12/31/2014 1313   ALT 11 08/13/2018 0930   ALT 23 12/31/2014 1313   ALKPHOS 39 08/13/2018 0930   ALKPHOS 66 12/31/2014 1313   BILITOT 0.4 08/13/2018 0930   BILITOT 0.2 12/31/2014 1313   GFRNONAA >60 08/13/2018 0930   GFRNONAA >60 12/31/2014 1313   GFRNONAA >60 07/16/2014 1351   GFRAA >60 08/13/2018 0930   GFRAA >60 12/31/2014 1313   GFRAA >60 07/16/2014 1351    No results found for: SPEP, UPEP  Lab Results  Component Value Date   WBC 4.2 08/13/2018   NEUTROABS 2.3 08/13/2018    HGB 10.7 (L) 08/13/2018   HCT 32.5 (L) 08/13/2018   MCV 86.8 08/13/2018   PLT 171 08/13/2018      Chemistry      Component Value Date/Time   NA 141 08/13/2018 0930   NA 143 12/31/2014 1313   K 3.1 (L) 08/13/2018 0930   K 3.5 12/31/2014 1313   CL 107 08/13/2018 0930   CL 110 (H) 12/31/2014 1313   CO2 27 08/13/2018 0930   CO2 28 12/31/2014 1313   BUN 13 08/13/2018 0930   BUN 13 12/31/2014  1313   CREATININE 0.93 08/13/2018 0930   CREATININE 0.89 12/31/2014 1313      Component Value Date/Time   CALCIUM 8.9 08/13/2018 0930   CALCIUM 8.4 (L) 12/31/2014 1313   ALKPHOS 39 08/13/2018 0930   ALKPHOS 66 12/31/2014 1313   AST 23 08/13/2018 0930   AST 21 12/31/2014 1313   ALT 11 08/13/2018 0930   ALT 23 12/31/2014 1313   BILITOT 0.4 08/13/2018 0930   BILITOT 0.2 12/31/2014 1313       RADIOGRAPHIC STUDIES: I have personally reviewed the radiological images as listed and agreed with the findings in the report. No results found.   ASSESSMENT & PLAN:  Carcinoma of upper-outer quadrant of right breast in female, estrogen receptor positive ([place]) #Stage II ER PR positive HER-2/neu positive right breast cancer; clinically no evidence of recurrence.  #Patient tolerating tamoxifen poorly [see discussion below]; recommend starting anastrozole 1 mg a day [patient had BSO].  Again reviewed the potential side effects of joint aches osteoporosis and heart fractures.  #Headaches improved.  #Possible weight loss/anxiety/depression [recently separated/father's death]-no concerns for malignancy causing weight loss.  Recommend Remeron 15 mg once a night.  Discussed the potential side effects of drowsiness.   #Body aches joint aches-continue vitamin D 50,000 units every week.  # follow up in 2 monts/no labs  Cc; Theora Gianotti; [place].    Orders Placed This Encounter  Procedures  . CBC with Differential/Platelet    Standing Status:   Future    Standing Expiration Date:   08/14/2019  .  Comprehensive metabolic panel    Standing Status:   Future    Standing Expiration Date:   08/14/2019  . Cancer antigen 27.29    Standing Status:   Future    Standing Expiration Date:   08/13/2019      Cammie Sickle, MD 08/13/2018 4:58 PM

## 2018-08-13 NOTE — Telephone Encounter (Signed)
I do not find a Vit D level in her labs and she does not have one ordered for the future either

## 2018-08-13 NOTE — Progress Notes (Unsigned)
Social worker provided counseling to patient at the cancer center. 

## 2018-08-14 LAB — CANCER ANTIGEN 27.29: CA 27.29: 21.9 U/mL (ref 0.0–38.6)

## 2018-09-06 ENCOUNTER — Encounter (HOSPITAL_COMMUNITY): Payer: Self-pay

## 2018-09-06 NOTE — Progress Notes (Signed)
Social worker provided counseling services to patient at the cancer center. 

## 2018-09-11 ENCOUNTER — Encounter (HOSPITAL_COMMUNITY): Payer: Self-pay

## 2018-09-11 NOTE — Progress Notes (Signed)
Social worker provided counseling services to patient at the cancer center. 

## 2018-09-19 ENCOUNTER — Encounter (HOSPITAL_COMMUNITY): Payer: Self-pay

## 2018-09-19 NOTE — Progress Notes (Signed)
Social worker provided counseling services to patient at the cancer center. 

## 2018-09-21 ENCOUNTER — Other Ambulatory Visit: Payer: Self-pay | Admitting: Internal Medicine

## 2018-09-21 DIAGNOSIS — Z170 Estrogen receptor positive status [ER+]: Principal | ICD-10-CM

## 2018-09-21 DIAGNOSIS — C50411 Malignant neoplasm of upper-outer quadrant of right female breast: Secondary | ICD-10-CM

## 2018-09-24 ENCOUNTER — Encounter (HOSPITAL_COMMUNITY): Payer: Self-pay

## 2018-09-24 ENCOUNTER — Encounter: Payer: Self-pay | Admitting: Internal Medicine

## 2018-09-24 ENCOUNTER — Inpatient Hospital Stay: Payer: BC Managed Care – PPO | Attending: Internal Medicine

## 2018-09-24 ENCOUNTER — Inpatient Hospital Stay: Payer: BC Managed Care – PPO

## 2018-09-24 ENCOUNTER — Inpatient Hospital Stay (HOSPITAL_BASED_OUTPATIENT_CLINIC_OR_DEPARTMENT_OTHER): Payer: BC Managed Care – PPO | Admitting: Internal Medicine

## 2018-09-24 VITALS — BP 119/74 | HR 78 | Temp 98.6°F | Resp 16 | Wt 131.0 lb

## 2018-09-24 DIAGNOSIS — Z170 Estrogen receptor positive status [ER+]: Secondary | ICD-10-CM

## 2018-09-24 DIAGNOSIS — Z9221 Personal history of antineoplastic chemotherapy: Secondary | ICD-10-CM

## 2018-09-24 DIAGNOSIS — Z79899 Other long term (current) drug therapy: Secondary | ICD-10-CM | POA: Diagnosis not present

## 2018-09-24 DIAGNOSIS — Z9011 Acquired absence of right breast and nipple: Secondary | ICD-10-CM | POA: Insufficient documentation

## 2018-09-24 DIAGNOSIS — Z79811 Long term (current) use of aromatase inhibitors: Secondary | ICD-10-CM

## 2018-09-24 DIAGNOSIS — Z90710 Acquired absence of both cervix and uterus: Secondary | ICD-10-CM | POA: Diagnosis not present

## 2018-09-24 DIAGNOSIS — C50411 Malignant neoplasm of upper-outer quadrant of right female breast: Secondary | ICD-10-CM

## 2018-09-24 LAB — CBC WITH DIFFERENTIAL/PLATELET
ABS IMMATURE GRANULOCYTES: 0.01 10*3/uL (ref 0.00–0.07)
BASOS PCT: 0 %
Basophils Absolute: 0 10*3/uL (ref 0.0–0.1)
Eosinophils Absolute: 0.1 10*3/uL (ref 0.0–0.5)
Eosinophils Relative: 3 %
HCT: 33.7 % — ABNORMAL LOW (ref 36.0–46.0)
Hemoglobin: 10.6 g/dL — ABNORMAL LOW (ref 12.0–15.0)
IMMATURE GRANULOCYTES: 0 %
LYMPHS PCT: 38 %
Lymphs Abs: 1.7 10*3/uL (ref 0.7–4.0)
MCH: 27.9 pg (ref 26.0–34.0)
MCHC: 31.5 g/dL (ref 30.0–36.0)
MCV: 88.7 fL (ref 80.0–100.0)
MONOS PCT: 9 %
Monocytes Absolute: 0.4 10*3/uL (ref 0.1–1.0)
NEUTROS ABS: 2.3 10*3/uL (ref 1.7–7.7)
NEUTROS PCT: 50 %
NRBC: 0 % (ref 0.0–0.2)
PLATELETS: 198 10*3/uL (ref 150–400)
RBC: 3.8 MIL/uL — AB (ref 3.87–5.11)
RDW: 13.4 % (ref 11.5–15.5)
WBC: 4.6 10*3/uL (ref 4.0–10.5)

## 2018-09-24 LAB — COMPREHENSIVE METABOLIC PANEL
ALT: 14 U/L (ref 0–44)
ANION GAP: 5 (ref 5–15)
AST: 25 U/L (ref 15–41)
Albumin: 3.9 g/dL (ref 3.5–5.0)
Alkaline Phosphatase: 54 U/L (ref 38–126)
BUN: 15 mg/dL (ref 6–20)
CHLORIDE: 107 mmol/L (ref 98–111)
CO2: 27 mmol/L (ref 22–32)
Calcium: 9.3 mg/dL (ref 8.9–10.3)
Creatinine, Ser: 0.77 mg/dL (ref 0.44–1.00)
Glucose, Bld: 82 mg/dL (ref 70–99)
POTASSIUM: 3.6 mmol/L (ref 3.5–5.1)
Sodium: 139 mmol/L (ref 135–145)
Total Bilirubin: 0.4 mg/dL (ref 0.3–1.2)
Total Protein: 6.9 g/dL (ref 6.5–8.1)

## 2018-09-24 MED ORDER — MIRTAZAPINE 30 MG PO TABS: 30.0000 mg | ORAL_TABLET | Freq: Every day | ORAL | 3 refills | Status: DC

## 2018-09-24 MED ORDER — ANASTROZOLE 1 MG PO TABS: 1.0000 mg | ORAL_TABLET | Freq: Every day | ORAL | 3 refills | Status: DC

## 2018-09-24 NOTE — Progress Notes (Signed)
Social worker provided patient counseling services at the cancer center.

## 2018-09-24 NOTE — Assessment & Plan Note (Addendum)
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  Stable.  # Continue anastrozole 1 mg a day [patient had BSO].  Tolerating well.  New prescription given.  We will get a bone density in approximately 4 months.  #Possible weight loss/anxiety/depression [recently separated/father's death]-improving.  ContinueRemeron 30 mg nightly.  Prescription given.  #Body aches joint aches-continue vitamin D 50,000 units every week. STABLE.   # DISPOSITION:  # follow up in 4 months/labs; BMD/left breast mammogram.

## 2018-09-24 NOTE — Progress Notes (Signed)
[place] OFFICE PROGRESS NOTE  Patient Care Team: [name], MD as PCP - General (Family Medicine) Dear, [name], MD (Inactive) (Family Medicine) Bary Castilla, Forest Gleason, MD (General Surgery) Forest Gleason, MD (Inactive) (Unknown Physician Specialty) Thornton Park, MD as Consulting Physician (Orthopedic Surgery)  Cancer Staging Carcinoma of upper-outer quadrant of right breast in female, estrogen receptor positive [facility]) Staging form: Breast, AJCC 7th Edition - Clinical: Stage IIA (T2, N0, M0) - Signed by Forest Gleason, MD on 05/09/2015    Oncology History   1. 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  Estrogen receptor positive.  Progesterone receptor positive.  HER-2/neu receptor amplified. 2. Started on neoadjuvant chemotherapy,  Taxotere, carboplatin and Herceptin and perjeta (March 05, 2014) 3. Patient does not have any clinically significant BRCA mutation Has been on and to [place] Mutation of unknown significance. . 4. 4 cycles of chemotherapy with TCH,perjeta previous did not June of 201 5.  Because of progressing side effect remaining 2 cycles of chemotherapy was not given but patient continued Herceptin and aperjeta  5. Status post right breast simple mastectomy in August of 2015 ypT1  ypN0 stage IB 6.patient started on tamoxifen in October of 2015 along with maintenance Herceptin therapy Patient is going for reconstructive surgery on October 21, 2014 final reconstructive surgery was done in February of 2016 [name] finished Herceptin therapy on March 11, 2015  # Sep 2019- STOP TAM; start Arimidex 108m/day.   # TAH & BSO [Dr.Harris; April 2019- menorrhagia] -------------------------------------------------    DIAGNOSIS: Breast cancer  STAGE:   II     ;GOALS: Cure  CURRENT/MOST RECENT THERAPY: Aromatase inhibitor      Carcinoma of upper-outer quadrant of right breast in female, estrogen receptor positive ([place]    INTERVAL HISTORY:  Sandra Nguyen 47y.o.  female right-sided stage II ER PR positive HER-2/neu positive status post mastectomy currently on anastrozole [TAH/BSO] is here for follow-up.  Patient's appetite is improving.  Has gained weight.  She is on Remeron.  Sleeping well.  Intermittent hot flashes.  Review of Systems  Constitutional: Positive for malaise/fatigue and weight loss. Negative for chills, diaphoresis and fever.  HENT: Negative.  Negative for nosebleeds and sore throat.   Eyes: Negative.  Negative for double vision.  Respiratory: Negative.  Negative for cough, hemoptysis, sputum production, shortness of breath and wheezing.   Cardiovascular: Negative.  Negative for chest pain, palpitations, orthopnea and leg swelling.  Gastrointestinal: Negative.  Negative for abdominal pain, blood in stool, constipation, diarrhea, heartburn, melena, nausea and vomiting.  Genitourinary: Negative.  Negative for dysuria, frequency and urgency.  Musculoskeletal: Positive for joint pain and myalgias. Negative for back pain.  Skin: Negative.  Negative for itching and rash.  Neurological: Negative for dizziness, tingling, focal weakness and weakness.  Endo/Heme/Allergies: Negative.  Does not bruise/bleed easily.  Psychiatric/Behavioral: Negative.  Negative for depression. The patient is not nervous/anxious and does not have insomnia.      REVIEW OF SYSTEMS:  A complete 10 point review of system is done which is negative except mentioned above/history of present illness.   PAST MEDICAL HISTORY :  Past Medical History:  Diagnosis Date  . Anemia   . Anxiety   . Atypical squamous cell changes of undetermined significance (ASCUS) on cervical cytology with negative high risk human papilloma virus (HPV) test result 06/20/2016  . Breast cancer ([facility] March 2015    Clinical T1c, N0, BRCA negative- Right  . Dysplasia  of cervix, low grade (CIN 1)   . Genetic testing of female 02/2014    CHEK2(09/22/16 no clinical significance)  . Malignant neoplasm of upper-outer quadrant of female breast [facility]) August 2015   pT1b, N0; M0. 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 Menorrhagia   . Migraines    migraines  . Vitamin D deficiency     PAST SURGICAL HISTORY :   Past Surgical History:  Procedure Laterality Date  . AUGMENTATION MAMMAPLASTY Bilateral Nov 2016   Dr Tula Nakayama REMOVED 07/2016  . BREAST BIOPSY Right 02/2014   positive  . BREAST RECONSTRUCTION Right 04/14/2015   Procedure: AEROLA/NIPPLE RECONSTRUCTION WITH GRAFT;  Surgeon: Nicholaus Bloom, MD;  Location: [place];  Service: Plastics;  Laterality: Right;  . BREAST SURGERY Right July 10, 2014   Right simple mastectomy with sentinel node biopsy.  . CERVICAL BIOPSY  W/ LOOP ELECTRODE EXCISION  1998  . COLONOSCOPY WITH PROPOFOL N/A 05/04/2016   Procedure: COLONOSCOPY WITH PROPOFOL;  Surgeon: Robert Bellow, MD;  Location: [facility] ENDOSCOPY;  Service: Endoscopy;  Laterality: N/A;  . CYSTOSCOPY  03/15/2018   Procedure: CYSTOSCOPY;  Surgeon: Gae Dry, MD;  Location: [facility] ORS;  Service: Gynecology;;  . DILATATION & CURETTAGE/HYSTEROSCOPY WITH MYOSURE N/A 08/03/2017   Procedure: [place];  Surgeon: Gae Dry, MD;  Location: [facility] ORS;  Service: Gynecology;  Laterality: N/A;  . ESSURE TUBAL LIGATION  09/15/2010  . EXCISIONAL HEMORRHOIDECTOMY  2015  . I and D of hemorrhoid  05/2017   Dr. Jamal Collin  . LAPAROSCOPIC HYSTERECTOMY Bilateral 03/15/2018   Procedure: HYSTERECTOMY TOTAL LAPAROSCOPIC BILATERAL SALPINGO OOPHORECTOMY;  Surgeon: Gae Dry, MD;  Location: [facility] ORS;  Service: Gynecology;  Laterality: Bilateral;  . MASTECTOMY Right 07/2014   chemo and herceptin tx  . PORTACATH PLACEMENT    . TUBAL LIGATION      FAMILY HISTORY :   Family History  Problem Relation Age of Onset  . Cervical cancer Maternal Aunt 65  . Hypertension Mother   . Heart disease  Maternal Grandmother   . Breast cancer Paternal Aunt 25  . Ovarian cancer Neg Hx   . Colon cancer Neg Hx     SOCIAL HISTORY:   Social History   Tobacco Use  . Smoking status: Never Smoker  . Smokeless tobacco: Never Used  Substance Use Topics  . Alcohol use: No  . Drug use: No    ALLERGIES:  is allergic to cephalexin.  MEDICATIONS:  Current Outpatient Medications  Medication Sig Dispense Refill  . anastrozole (ARIMIDEX) 1 MG tablet Take 1 tablet (1 mg total) by mouth daily. 90 tablet 3  . Biotin 10000 MCG TABS Take 10,000 mcg by mouth at bedtime.    . clonazePAM (KLONOPIN) 0.5 MG tablet Take 1 mg by mouth at bedtime.     Marland Kitchen levocetirizine (XYZAL) 5 MG tablet Take 5 mg by mouth every evening.     . mirtazapine (REMERON) 30 MG tablet Take 1 tablet (30 mg total) by mouth at bedtime. 90 tablet 3  . Vitamin D, Ergocalciferol, (DRISDOL) 50000 units CAPS capsule TAKE ONE CAPSULE BY MOUTH ONE TIME PER WEEK 12 capsule 2   No current facility-administered medications for this visit.     PHYSICAL EXAMINATION: ECOG PERFORMANCE STATUS: 0 - Asymptomatic  BP 119/74 (BP Location: Left Arm, Patient Position: Sitting)   Pulse 78   Temp 98.6 F (37 C) (Tympanic)   Resp 16   Wt 131 lb (59.4 kg)  LMP 02/15/2018   BMI 23.21 kg/m   Filed Weights   09/24/18 1041  Weight: 131 lb (59.4 kg)   Physical Exam  Constitutional: She is oriented to person, place, and time and well-developed, well-nourished, and in no distress.  HENT:  Head: Normocephalic and atraumatic.  Mouth/Throat: Oropharynx is clear and moist. No oropharyngeal exudate.  Eyes: Pupils are equal, round, and reactive to light.  Neck: Normal range of motion. Neck supple.  Cardiovascular: Normal rate and regular rhythm.  Pulmonary/Chest: No respiratory distress. She has no wheezes.  Abdominal: Soft. Bowel sounds are normal. She exhibits no distension and no mass. There is no tenderness. There is no rebound and no guarding.   Musculoskeletal: Normal range of motion. She exhibits no edema or tenderness.  Neurological: She is alert and oriented to person, place, and time.  Skin: Skin is warm.  Psychiatric: Affect normal.     LABORATORY DATA:  I have reviewed the data as listed    Component Value Date/Time   NA 139 09/24/2018 1011   NA 143 12/31/2014 1313   K 3.6 09/24/2018 1011   K 3.5 12/31/2014 1313   CL 107 09/24/2018 1011   CL 110 (H) 12/31/2014 1313   CO2 27 09/24/2018 1011   CO2 28 12/31/2014 1313   GLUCOSE 82 09/24/2018 1011   GLUCOSE 124 (H) 12/31/2014 1313   BUN 15 09/24/2018 1011   BUN 13 12/31/2014 1313   CREATININE 0.77 09/24/2018 1011   CREATININE 0.89 12/31/2014 1313   CALCIUM 9.3 09/24/2018 1011   CALCIUM 8.4 (L) 12/31/2014 1313   PROT 6.9 09/24/2018 1011   PROT 6.9 12/31/2014 1313   ALBUMIN 3.9 09/24/2018 1011   ALBUMIN 3.5 12/31/2014 1313   AST 25 09/24/2018 1011   AST 21 12/31/2014 1313   ALT 14 09/24/2018 1011   ALT 23 12/31/2014 1313   ALKPHOS 54 09/24/2018 1011   ALKPHOS 66 12/31/2014 1313   BILITOT 0.4 09/24/2018 1011   BILITOT 0.2 12/31/2014 1313   GFRNONAA >60 09/24/2018 1011   GFRNONAA >60 12/31/2014 1313   GFRNONAA >60 07/16/2014 1351   GFRAA >60 09/24/2018 1011   GFRAA >60 12/31/2014 1313   GFRAA >60 07/16/2014 1351    No results found for: SPEP, UPEP  Lab Results  Component Value Date   WBC 4.6 09/24/2018   NEUTROABS 2.3 09/24/2018   HGB 10.6 (L) 09/24/2018   HCT 33.7 (L) 09/24/2018   MCV 88.7 09/24/2018   PLT 198 09/24/2018      Chemistry      Component Value Date/Time   NA 139 09/24/2018 1011   NA 143 12/31/2014 1313   K 3.6 09/24/2018 1011   K 3.5 12/31/2014 1313   CL 107 09/24/2018 1011   CL 110 (H) 12/31/2014 1313   CO2 27 09/24/2018 1011   CO2 28 12/31/2014 1313   BUN 15 09/24/2018 1011   BUN 13 12/31/2014 1313   CREATININE 0.77 09/24/2018 1011   CREATININE 0.89 12/31/2014 1313      Component Value Date/Time   CALCIUM 9.3  09/24/2018 1011   CALCIUM 8.4 (L) 12/31/2014 1313   ALKPHOS 54 09/24/2018 1011   ALKPHOS 66 12/31/2014 1313   AST 25 09/24/2018 1011   AST 21 12/31/2014 1313   ALT 14 09/24/2018 1011   ALT 23 12/31/2014 1313   BILITOT 0.4 09/24/2018 1011   BILITOT 0.2 12/31/2014 1313       RADIOGRAPHIC STUDIES: I have personally reviewed the radiological images as  listed and agreed with the findings in the report. No results found.   ASSESSMENT & PLAN:  Carcinoma of upper-outer quadrant of right breast in female, estrogen receptor positive ([place]) #Stage II ER PR positive HER-2/neu positive right breast cancer; clinically no evidence of recurrence.  Stable.  # Continue anastrozole 1 mg a day [patient had BSO].  Tolerating well.  New prescription given.  We will get a bone density in approximately 4 months.  #Possible weight loss/anxiety/depression [recently separated/father's death]-improving.  ContinueRemeron 30 mg nightly.  Prescription given.  #Body aches joint aches-continue vitamin D 50,000 units every week. STABLE.   # DISPOSITION:  # follow up in 4 months/labs; BMD/left breast mammogram.   Orders Placed This Encounter  Procedures  . MM Digital Screening Unilat L    Standing Status:   Future    Standing Expiration Date:   09/24/2019    Order Specific Question:   Reason for Exam (SYMPTOM  OR DIAGNOSIS REQUIRED)    Answer:   Breast cancer    Order Specific Question:   Is the patient pregnant?    Answer:   No    Order Specific Question:   Preferred imaging location?    Answer:   [facility]  . DG Bone Density    Standing Status:   Future    Standing Expiration Date:   09/24/2019    Order Specific Question:   Reason for Exam (SYMPTOM  OR DIAGNOSIS REQUIRED)    Answer:   Breast cancer    Order Specific Question:   Is the patient pregnant?    Answer:   No    Order Specific Question:   Preferred imaging location?    Answer:   [facility]  . MM 3D SCREEN BREAST UNI LEFT     Standing Status:   Future    Standing Expiration Date:   11/25/2019    Order Specific Question:   Reason for Exam (SYMPTOM  OR DIAGNOSIS REQUIRED)    Answer:   breast cancer    Order Specific Question:   Is the patient pregnant?    Answer:   No    Order Specific Question:   Preferred imaging location?    Answer:   [facility]      Cammie Sickle, MD 09/24/2018 11:29 AM

## 2018-09-24 NOTE — Progress Notes (Signed)
Nutrition Follow-up:  Patient with breast cancer s/p mastectomy on tamoxifen.  Patient followed by Dr. Jacinto Reap    Met with patient in clinic this am.  Patient reports appetite is better and has not been focusing on the guilty text messages from ex husband.  Noted patient meeting with cancer center Education officer, museum.  Reports that she feels better overall and pleased with recent weight gain.  Reports that she has been incorporating snacks and shakes at times to increase calories and protein.    Medications: remeron  Labs: reviewed  Anthropometrics:   Weight has increased to 131 lb from 116 lb on 9/9.     NUTRITION DIAGNOSIS: Inadequate oral intake improved   INTERVENTION:  Congratulated patient on weight gain!  Patient pleased with progress. Discussed importance of plant based diet, good sources of protein and heart healthy fats.  Fact sheet given with resources.   Encouraged exercise as well. Contact information given to patient and encouraged to reach out to RD with further questions or concerns    MONITORING, EVALUATION, GOAL: Patient will consume adequate calories and protein to prevent further weight loss   NEXT VISIT: no further follow-up planned.  Patient to contact RD with questions in the future.  Shivansh Hardaway B. Zenia Resides, [place], [place] Registered Dietitian [phone] (pager)

## 2018-09-25 LAB — CANCER ANTIGEN 27.29: CA 27.29: 25.8 U/mL (ref 0.0–38.6)

## 2018-10-17 ENCOUNTER — Telehealth: Payer: Self-pay | Admitting: *Deleted

## 2018-10-17 NOTE — Telephone Encounter (Signed)
Patient called reporting that she has misplaced her cancer pills and pharmacy will not refill it for her. She got Anastrozole 1 mg tabs #90 on 09/24/18. Please advise if I should call pharmacy to ok refill of this for her

## 2018-10-17 NOTE — Telephone Encounter (Signed)
OK to refill per Dr Rogue Bussing. I spoke with pharmacist at [facility] and he stated that insurance will not pay for new prescription until 10/16. He can sell her 3 tabs to tide her over until the 16th at a cost of $20 but he has a coupon where it will cost her $13 for 3 tabs. She will go get them

## 2018-10-17 NOTE — Telephone Encounter (Signed)
Sandra Nguyen- as previously, ok to refill.Dr.B

## 2018-12-17 ENCOUNTER — Other Ambulatory Visit: Payer: BC Managed Care – PPO

## 2018-12-17 ENCOUNTER — Ambulatory Visit: Payer: BC Managed Care – PPO | Admitting: Internal Medicine

## 2018-12-26 ENCOUNTER — Ambulatory Visit
Admission: RE | Admit: 2018-12-26 | Discharge: 2018-12-26 | Disposition: A | Discharge status: Home / Self Care | Payer: BC Managed Care – PPO | Source: Ambulatory Visit | Attending: Internal Medicine | Admitting: Internal Medicine

## 2018-12-26 DIAGNOSIS — C50411 Malignant neoplasm of upper-outer quadrant of right female breast: Secondary | ICD-10-CM

## 2018-12-26 DIAGNOSIS — Z170 Estrogen receptor positive status [ER+]: Secondary | ICD-10-CM | POA: Insufficient documentation

## 2018-12-31 ENCOUNTER — Inpatient Hospital Stay: Payer: BC Managed Care – PPO | Attending: Internal Medicine

## 2018-12-31 ENCOUNTER — Encounter: Payer: Self-pay | Admitting: Nurse Practitioner

## 2018-12-31 ENCOUNTER — Inpatient Hospital Stay (HOSPITAL_BASED_OUTPATIENT_CLINIC_OR_DEPARTMENT_OTHER): Payer: BC Managed Care – PPO | Admitting: Nurse Practitioner

## 2018-12-31 VITALS — BP 121/79 | HR 70 | Temp 99.0°F | Resp 16 | Wt 149.0 lb

## 2018-12-31 DIAGNOSIS — F419 Anxiety disorder, unspecified: Secondary | ICD-10-CM | POA: Insufficient documentation

## 2018-12-31 DIAGNOSIS — Z79899 Other long term (current) drug therapy: Secondary | ICD-10-CM | POA: Diagnosis not present

## 2018-12-31 DIAGNOSIS — Z800 Family history of malignant neoplasm of digestive organs: Secondary | ICD-10-CM | POA: Insufficient documentation

## 2018-12-31 DIAGNOSIS — C50411 Malignant neoplasm of upper-outer quadrant of right female breast: Secondary | ICD-10-CM | POA: Diagnosis present

## 2018-12-31 DIAGNOSIS — Z8041 Family history of malignant neoplasm of ovary: Secondary | ICD-10-CM | POA: Insufficient documentation

## 2018-12-31 DIAGNOSIS — Z8049 Family history of malignant neoplasm of other genital organs: Secondary | ICD-10-CM

## 2018-12-31 DIAGNOSIS — Z170 Estrogen receptor positive status [ER+]: Secondary | ICD-10-CM | POA: Insufficient documentation

## 2018-12-31 DIAGNOSIS — F329 Major depressive disorder, single episode, unspecified: Secondary | ICD-10-CM | POA: Diagnosis not present

## 2018-12-31 DIAGNOSIS — Z803 Family history of malignant neoplasm of breast: Secondary | ICD-10-CM | POA: Insufficient documentation

## 2018-12-31 DIAGNOSIS — Z9011 Acquired absence of right breast and nipple: Secondary | ICD-10-CM | POA: Diagnosis not present

## 2018-12-31 DIAGNOSIS — Z79811 Long term (current) use of aromatase inhibitors: Secondary | ICD-10-CM | POA: Diagnosis not present

## 2018-12-31 LAB — CBC WITH DIFFERENTIAL/PLATELET
Abs Immature Granulocytes: 0.02 10*3/uL (ref 0.00–0.07)
BASOS ABS: 0 10*3/uL (ref 0.0–0.1)
BASOS PCT: 0 %
Eosinophils Absolute: 0.1 10*3/uL (ref 0.0–0.5)
Eosinophils Relative: 2 %
HCT: 37.3 % (ref 36.0–46.0)
Hemoglobin: 11.6 g/dL — ABNORMAL LOW (ref 12.0–15.0)
IMMATURE GRANULOCYTES: 0 %
Lymphocytes Relative: 38 %
Lymphs Abs: 1.8 10*3/uL (ref 0.7–4.0)
MCH: 27.4 pg (ref 26.0–34.0)
MCHC: 31.1 g/dL (ref 30.0–36.0)
MCV: 88.2 fL (ref 80.0–100.0)
MONO ABS: 0.5 10*3/uL (ref 0.1–1.0)
Monocytes Relative: 11 %
NEUTROS ABS: 2.4 10*3/uL (ref 1.7–7.7)
NEUTROS PCT: 49 %
NRBC: 0 % (ref 0.0–0.2)
PLATELETS: 210 10*3/uL (ref 150–400)
RBC: 4.23 MIL/uL (ref 3.87–5.11)
RDW: 13.7 % (ref 11.5–15.5)
WBC: 4.8 10*3/uL (ref 4.0–10.5)

## 2018-12-31 LAB — COMPREHENSIVE METABOLIC PANEL
ALBUMIN: 4.2 g/dL (ref 3.5–5.0)
ALT: 15 U/L (ref 0–44)
ANION GAP: 9 (ref 5–15)
AST: 23 U/L (ref 15–41)
Alkaline Phosphatase: 59 U/L (ref 38–126)
BILIRUBIN TOTAL: 0.5 mg/dL (ref 0.3–1.2)
BUN: 16 mg/dL (ref 6–20)
CO2: 24 mmol/L (ref 22–32)
Calcium: 9.5 mg/dL (ref 8.9–10.3)
Chloride: 108 mmol/L (ref 98–111)
Creatinine, Ser: 0.93 mg/dL (ref 0.44–1.00)
GFR calc Af Amer: >60 mL/min (ref >60)
Glucose, Bld: 97 mg/dL (ref 70–99)
POTASSIUM: 4.1 mmol/L (ref 3.5–5.1)
SODIUM: 141 mmol/L (ref 135–145)
Total Protein: 7.4 g/dL (ref 6.5–8.1)

## 2018-12-31 MED ORDER — CLONIDINE HCL 0.1 MG PO TABS: 0.1000 mg | ORAL_TABLET | Freq: Every day | ORAL | 0 refills | Status: DC

## 2018-12-31 NOTE — Progress Notes (Addendum)
[place] OFFICE PROGRESS NOTE  Patient Care Team: [name], MD as PCP - General (Family Medicine) Dear, [name], MD (Inactive) (Family Medicine) Bary Castilla, Forest Gleason, MD (General Surgery) Forest Gleason, MD (Inactive) (Unknown Physician Specialty) Thornton Park, MD as Consulting Physician (Orthopedic Surgery)  Cancer Staging Carcinoma of upper-outer quadrant of right breast in female, estrogen receptor positive [facility]) Staging form: Breast, AJCC 7th Edition - Clinical: Stage IIA (T2, N0, M0) - Signed by Forest Gleason, MD on 05/09/2015   Oncology History   1. 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  Estrogen receptor positive.  Progesterone receptor positive.  HER-2/neu receptor amplified. 2. Started on neoadjuvant chemotherapy,  Taxotere, carboplatin and Herceptin and perjeta (March 05, 2014) 3. Patient does not have any clinically significant BRCA mutation Has been on and to [place] Mutation of unknown significance. . 4. 4 cycles of chemotherapy with TCH,perjeta previous did not June of 201 5.  Because of progressing side effect remaining 2 cycles of chemotherapy was not given but patient continued Herceptin and aperjeta  5. Status post right breast simple mastectomy in August of 2015 ypT1  ypN0 stage IB 6.patient started on tamoxifen in October of 2015 along with maintenance Herceptin therapy Patient is going for reconstructive surgery on October 21, 2014 final reconstructive surgery was done in February of 2016 [name] finished Herceptin therapy on March 11, 2015  # Sep 2019- STOP TAM; start Arimidex 38m/day.   # TAH & BSO [Dr.Harris; April 2019- menorrhagia] -------------------------------------------------    DIAGNOSIS: Breast cancer  STAGE:   II     ;GOALS: Cure  CURRENT/MOST RECENT THERAPY: Aromatase inhibitor      Carcinoma of upper-outer quadrant of right breast in female, estrogen receptor positive [place]    INTERVAL HISTORY: Sandra Nguyen 48y.o.  female right-sided stage II ER PR positive HER-2/neu positive status post mastectomy currently on anastrozole [TLH/BSO] is here for follow-up.   In the interim, she underwent bone density scan on 12/26/2018 which revealed T score of -0.9. Mammogram on 12/26/2018 of left breast Reported as BI-RADS Category 1-negative.  She reports continued hot flashes but otherwise says she is tolerating Arimidex well.  She has hot flashes occur the night and day and are embarrassing and bothersome.  Sleep interrupted by hot flashes. She is s/p TLH-BSO for heavy menstrual bleeding and cancer risks (03/15/2018 with Dr. HKenton Kingfisher. On Remeron.  Appetite improved and she has had continued weight gain since her last visit.  She performed self breast exams and denies any new lumps or bumps.  Denies skin changes.  Overall feels well.  Review of Systems  Constitutional: Negative for chills, diaphoresis, fever, malaise/fatigue and weight loss.  HENT: Negative.  Negative for nosebleeds and sore throat.   Eyes: Negative.  Negative for double vision.  Respiratory: Negative.  Negative for cough, hemoptysis, sputum production, shortness of breath and wheezing.   Cardiovascular: Negative.  Negative for chest pain, palpitations, orthopnea and leg swelling.  Gastrointestinal: Negative.  Negative for abdominal pain, blood in stool, constipation, diarrhea, heartburn, melena, nausea and vomiting.  Genitourinary: Negative.  Negative for dysuria, frequency and urgency.  Musculoskeletal: Positive for myalgias (Improved). Negative for back pain and joint pain.  Skin: Negative.  Negative for itching and rash.  Neurological: Negative for dizziness, tingling, focal weakness and weakness.       Hot flashes  Endo/Heme/Allergies: Negative.  Does not bruise/bleed easily.  Psychiatric/Behavioral: Negative for depression. The patient has insomnia.  The patient is not nervous/anxious.     PAST MEDICAL  HISTORY :  Past Medical History:  Diagnosis Date  . Anemia   . Anxiety   . Atypical squamous cell changes of undetermined significance (ASCUS) on cervical cytology with negative high risk human papilloma virus (HPV) test result 06/20/2016  . Breast cancer [facility]) March 2015    Clinical T1c, N0, BRCA negative- Right  . Dysplasia of cervix, low grade (CIN 1)   . 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 Malignant neoplasm of upper-outer quadrant of female breast [facility]) August 2015   pT1b, N0; M0. 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 Menorrhagia   . Migraines    migraines  . Vitamin D deficiency     PAST SURGICAL HISTORY :   Past Surgical History:  Procedure Laterality Date  . AUGMENTATION MAMMAPLASTY Bilateral Nov 2016   Dr Tula Nakayama REMOVED 07/2016  . BREAST BIOPSY Right 02/2014   positive  . BREAST RECONSTRUCTION Right 04/14/2015   Procedure: AEROLA/NIPPLE RECONSTRUCTION WITH GRAFT;  Surgeon: Nicholaus Bloom, MD;  Location: [place];  Service: Plastics;  Laterality: Right;  . BREAST SURGERY Right July 10, 2014   Right simple mastectomy with sentinel node biopsy.  . CERVICAL BIOPSY  W/ LOOP ELECTRODE EXCISION  1998  . COLONOSCOPY WITH PROPOFOL N/A 05/04/2016   Procedure: COLONOSCOPY WITH PROPOFOL;  Surgeon: Robert Bellow, MD;  Location: [facility] ENDOSCOPY;  Service: Endoscopy;  Laterality: N/A;  . CYSTOSCOPY  03/15/2018   Procedure: CYSTOSCOPY;  Surgeon: Gae Dry, MD;  Location: [facility] ORS;  Service: Gynecology;;  . DILATATION & CURETTAGE/HYSTEROSCOPY WITH MYOSURE N/A 08/03/2017   Procedure: [place];  Surgeon: Gae Dry, MD;  Location: [facility]S;  Service: Gynecology;  Laterality: N/A;  . ESSURE TUBAL LIGATION  09/15/2010  . EXCISIONAL HEMORRHOIDECTOMY  2015  . I and D of hemorrhoid  05/2017   Dr. Jamal Collin  . LAPAROSCOPIC HYSTERECTOMY Bilateral 03/15/2018   Procedure: HYSTERECTOMY  TOTAL LAPAROSCOPIC BILATERAL SALPINGO OOPHORECTOMY;  Surgeon: Gae Dry, MD;  Location: [facility] ORS;  Service: Gynecology;  Laterality: Bilateral;  . MASTECTOMY Right 07/2014   chemo and herceptin tx  . PORTACATH PLACEMENT    . TUBAL LIGATION      FAMILY HISTORY :   Family History  Problem Relation Age of Onset  . Cervical cancer Maternal Aunt 65  . Hypertension Mother   . Heart disease Maternal Grandmother   . Breast cancer Paternal Aunt 77  . Ovarian cancer Neg Hx   . Colon cancer Neg Hx     SOCIAL HISTORY:   Social History   Tobacco Use  . Smoking status: Never Smoker  . Smokeless tobacco: Never Used  Substance Use Topics  . Alcohol use: No  . Drug use: No    ALLERGIES:  is allergic to cephalexin.  MEDICATIONS:  Current Outpatient Medications  Medication Sig Dispense Refill  . anastrozole (ARIMIDEX) 1 MG tablet Take 1 tablet (1 mg total) by mouth daily. 90 tablet 3  . Biotin 10000 MCG TABS Take 10,000 mcg by mouth at bedtime.    . clonazePAM (KLONOPIN) 0.5 MG tablet Take 1 mg by mouth at bedtime.     Marland Kitchen levocetirizine (XYZAL) 5 MG tablet Take 5 mg by mouth every evening.     . mirtazapine (REMERON) 30 MG tablet Take 1 tablet (30 mg total) by mouth at bedtime. 90 tablet 3  . Vitamin D, Ergocalciferol, (DRISDOL) 50000  units CAPS capsule TAKE ONE CAPSULE BY MOUTH ONE TIME PER WEEK 12 capsule 2   No current facility-administered medications for this visit.     PHYSICAL EXAMINATION: ECOG PERFORMANCE STATUS: 0 - Asymptomatic Today's Vitals   12/31/18 1019  BP: 121/79  Pulse: 70  Resp: 16  Temp: 99 F (37.2 C)  TempSrc: Oral  Weight: 149 lb (67.6 kg)  PainSc: 0-No pain   Body mass index is 26.39 kg/m.  LMP 02/15/2018   Filed Weights   12/31/18 1019  Weight: 149 lb (67.6 kg)   Physical Exam  Constitutional: She is oriented to person, place, and time and well-developed, well-nourished, and in no distress.  HENT:  Head: Normocephalic and atraumatic.   Mouth/Throat: Oropharynx is clear and moist. No oropharyngeal exudate.  Eyes: Conjunctivae are normal. No scleral icterus.  Neck: Normal range of motion. Neck supple.  Cardiovascular: Normal rate and regular rhythm.  Pulmonary/Chest: No respiratory distress. She has no wheezes.  Abdominal: Soft. Bowel sounds are normal. She exhibits no distension and no mass. There is no abdominal tenderness. There is no rebound and no guarding.  Musculoskeletal: Normal range of motion.        General: No tenderness or edema.  Neurological: She is alert and oriented to person, place, and time.  Skin: Skin is warm.  Psychiatric: Affect normal.   LABORATORY DATA:  I have reviewed the data as listed    Component Value Date/Time   NA 141 12/31/2018 0939   NA 143 12/31/2014 1313   K 4.1 12/31/2018 0939   K 3.5 12/31/2014 1313   CL 108 12/31/2018 0939   CL 110 (H) 12/31/2014 1313   CO2 24 12/31/2018 0939   CO2 28 12/31/2014 1313   GLUCOSE 97 12/31/2018 0939   GLUCOSE 124 (H) 12/31/2014 1313   BUN 16 12/31/2018 0939   BUN 13 12/31/2014 1313   CREATININE 0.93 12/31/2018 0939   CREATININE 0.89 12/31/2014 1313   CALCIUM 9.5 12/31/2018 0939   CALCIUM 8.4 (L) 12/31/2014 1313   PROT 7.4 12/31/2018 0939   PROT 6.9 12/31/2014 1313   ALBUMIN 4.2 12/31/2018 0939   ALBUMIN 3.5 12/31/2014 1313   AST 23 12/31/2018 0939   AST 21 12/31/2014 1313   ALT 15 12/31/2018 0939   ALT 23 12/31/2014 1313   ALKPHOS 59 12/31/2018 0939   ALKPHOS 66 12/31/2014 1313   BILITOT 0.5 12/31/2018 0939   BILITOT 0.2 12/31/2014 1313   GFRNONAA >60 12/31/2018 0939   GFRNONAA >60 12/31/2014 1313   GFRNONAA >60 07/16/2014 1351   GFRAA >60 12/31/2018 0939   GFRAA >60 12/31/2014 1313   GFRAA >60 07/16/2014 1351    No results found for: SPEP, UPEP  Lab Results  Component Value Date   WBC 4.8 12/31/2018   NEUTROABS 2.4 12/31/2018   HGB 11.6 (L) 12/31/2018   HCT 37.3 12/31/2018   MCV 88.2 12/31/2018   PLT 210 12/31/2018       Chemistry      Component Value Date/Time   NA 141 12/31/2018 0939   NA 143 12/31/2014 1313   K 4.1 12/31/2018 0939   K 3.5 12/31/2014 1313   CL 108 12/31/2018 0939   CL 110 (H) 12/31/2014 1313   CO2 24 12/31/2018 0939   CO2 28 12/31/2014 1313   BUN 16 12/31/2018 0939   BUN 13 12/31/2014 1313   CREATININE 0.93 12/31/2018 0939   CREATININE 0.89 12/31/2014 1313      Component Value Date/Time  CALCIUM 9.5 12/31/2018 0939   CALCIUM 8.4 (L) 12/31/2014 1313   ALKPHOS 59 12/31/2018 0939   ALKPHOS 66 12/31/2014 1313   AST 23 12/31/2018 0939   AST 21 12/31/2014 1313   ALT 15 12/31/2018 0939   ALT 23 12/31/2014 1313   BILITOT 0.5 12/31/2018 0939   BILITOT 0.2 12/31/2014 1313      RADIOGRAPHIC STUDIES: I have personally reviewed the radiological images as listed and agreed with the findings in the report. No results found.   12/26/2018-bone density  12/26/2018- MM 3D SCREEN BREAST UNI LEFT  ASSESSMENT & PLAN:  No problem-specific Assessment & Plan notes found for this encounter.  1. Breast cancer-stage II ER/PR positive, HER-2/neu positive right breast cancer s/p mastectomy.  Clinically no evidence of recurrence.  Tolerating Arimidex moderately well (see below). Left breast mammogram on 12/26/2018 reported as BI-RADS Category 1-negative.  Plan to repeat in 1 year/12/2019.  -------------addendum-------------CA 27.29 tumor marker rising. 39.4 today. Discussed with Dr. Rogue Bussing who recommends imaging to evaluate including CT Chest/Abdomen/Pelvis.   2. Hot Flashes- likely secondary to arimidex. We discussed options for management and I advised that based on her personal history of breast cancer I would advise against hormonal options.  We discussed options for management including but not limited toSSRIs/SNRIs, acupuncture, gabapentin, and clonidine.  After lengthy discussion of pros and cons of various treatments as well as side effects, she was reluctant to start anti-depressant  such as venlafaxine, as she is currently well controlled on remeron. She was interested in clonidine as she believes she has friends taking this medication. Advised that this medication can cause drops in her blood pressure, dizziness, drowsiness, dry mouth, or constipation and that she would need to monitor her blood pressures as home which she was agreeable to. Start clonidine 0.1 mg daily.   3. Anxiety & Depression- improved. Currently on remeron/TCA. Stable/improved. Given weight gain (see below) may consider stopping remeron at next appointment.   4. Weight loss- secondary to anxiety/depression (see above). Weight today 149 lbs, BMI 26.4. Resolved. Continue to monitor.   5. Bone Density-bone density scan on 12/26/2018 reported with T score of -0.9 considered normal. Continue otc calcium and vitamin d and weight bearing exercise to maintain bone health. Plan to repeat bone density scan in 12/2020.   rtc in 1 month for follow up for hot flashes with Dr. Rogue Bussing then again in 6 months for labs and follow up for breast cancer with Dr. Rogue Bussing.   No orders of the defined types were placed in this encounter.   Beckey Rutter, DNP, AGNP-C [place] [place] (work cell) [phone] (office)  CC: Dr. Rogue Bussing

## 2019-01-01 LAB — CANCER ANTIGEN 27.29: CA 27.29: 39.4 U/mL — ABNORMAL HIGH (ref 0.0–38.6)

## 2019-01-08 ENCOUNTER — Encounter (HOSPITAL_COMMUNITY): Payer: Self-pay

## 2019-01-08 NOTE — Progress Notes (Unsigned)
Social worker provided counseling to patient at the cancer center.

## 2019-01-09 ENCOUNTER — Telehealth: Payer: Self-pay | Admitting: Internal Medicine

## 2019-01-09 ENCOUNTER — Other Ambulatory Visit: Payer: Self-pay | Admitting: Nurse Practitioner

## 2019-01-09 DIAGNOSIS — Z170 Estrogen receptor positive status [ER+]: Principal | ICD-10-CM

## 2019-01-09 DIAGNOSIS — C50411 Malignant neoplasm of upper-outer quadrant of right female breast: Secondary | ICD-10-CM

## 2019-01-09 NOTE — Telephone Encounter (Signed)
Left message for pt to call us back re: labs.  Pt has slightly elevated CA-27-29; I would recommend CT scan chest and pelvis with contrast ASAP.  Lauren-looks like he has seen her recently in the absence.  Please try to reach out to her maybe tomorrow/inform her of the plan and order scans.  Let me know if patient still wants to talk to me.  Thank you, GB

## 2019-01-10 ENCOUNTER — Telehealth: Payer: Self-pay | Admitting: Nurse Practitioner

## 2019-01-10 NOTE — Telephone Encounter (Signed)
-----   Message from Loa Socks sent at 01/10/2019 11:22 AM EST ----- Regarding: Cancel CT Please cancel CT's.  Auth pending.  TY

## 2019-01-10 NOTE — Telephone Encounter (Signed)
Called patient. Left voicemail regarding scans on hold pending insurance approval.

## 2019-01-10 NOTE — Telephone Encounter (Signed)
Called patient. Again discussed results of ca 27.29 and Dr. Aletha Halim recommendation to proceed with imaging. Appointments made for [facility] on 01/11/2019 at 9:00. Instructed patient to pick up contrast today.

## 2019-01-11 ENCOUNTER — Ambulatory Visit: Payer: BC Managed Care – PPO

## 2019-01-14 ENCOUNTER — Telehealth: Payer: Self-pay | Admitting: Nurse Practitioner

## 2019-01-14 NOTE — Telephone Encounter (Signed)
-----   Message from Loa Socks sent at 01/14/2019  3:04 PM EST ----- Regarding: CT c/a/p ready for scheduling CT c/a/p are now approved and ready for scheduling.  TY

## 2019-01-14 NOTE — Telephone Encounter (Signed)
Called patient to update on status of scan which has now been approved. Scheduling to reach out to patient to schedule. Patient thanked for update and will call if concerns.

## 2019-01-22 ENCOUNTER — Telehealth: Payer: Self-pay | Admitting: Nurse Practitioner

## 2019-01-22 ENCOUNTER — Other Ambulatory Visit: Payer: Self-pay | Admitting: Nurse Practitioner

## 2019-01-22 NOTE — Telephone Encounter (Signed)
Patient scheduled for ct scan tomorrow. She called and left message asking if results would be given to her at imaging site. Advised that radiology and Dr. Rogue Bussing will review images then either call her with results or have her return to clinic for discussion. I will reach out to Dr. Rogue Bussing for his preference.

## 2019-01-23 ENCOUNTER — Ambulatory Visit
Admission: RE | Admit: 2019-01-23 | Discharge: 2019-01-23 | Disposition: A | Discharge status: Home / Self Care | Payer: BC Managed Care – PPO | Source: Ambulatory Visit | Attending: Nurse Practitioner | Admitting: Nurse Practitioner

## 2019-01-23 DIAGNOSIS — C50411 Malignant neoplasm of upper-outer quadrant of right female breast: Secondary | ICD-10-CM | POA: Insufficient documentation

## 2019-01-23 DIAGNOSIS — Z170 Estrogen receptor positive status [ER+]: Secondary | ICD-10-CM | POA: Diagnosis present

## 2019-01-23 MED ORDER — IOPAMIDOL (ISOVUE-300) INJECTION 61%
100.0000 mL | Freq: Once | INTRAVENOUS | Status: AC | PRN
  Administered 2019-01-23: 100 mL via INTRAVENOUS

## 2019-01-24 ENCOUNTER — Telehealth: Payer: Self-pay | Admitting: Internal Medicine

## 2019-01-24 DIAGNOSIS — Z170 Estrogen receptor positive status [ER+]: Principal | ICD-10-CM

## 2019-01-24 DIAGNOSIS — C50411 Malignant neoplasm of upper-outer quadrant of right female breast: Secondary | ICD-10-CM

## 2019-01-24 MED ORDER — CLONIDINE HCL 0.1 MG PO TABS: 0.1000 mg | ORAL_TABLET | Freq: Every day | ORAL | 3 refills | Status: DC

## 2019-01-24 NOTE — Telephone Encounter (Signed)
Scheduling msg sent to arrange for these apts.

## 2019-01-24 NOTE — Telephone Encounter (Signed)
Spoke to pt re: results of the CT scan; will plan to get PET ASAP.  Please cancel appt with me for 2/24; schedule PET asap; follow up with me 1 day later/ no labs.   GB

## 2019-01-28 ENCOUNTER — Other Ambulatory Visit: Payer: BC Managed Care – PPO

## 2019-01-28 ENCOUNTER — Ambulatory Visit: Payer: BC Managed Care – PPO | Admitting: Internal Medicine

## 2019-01-28 ENCOUNTER — Inpatient Hospital Stay: Payer: BC Managed Care – PPO | Admitting: Internal Medicine

## 2019-01-31 ENCOUNTER — Inpatient Hospital Stay: Payer: BC Managed Care – PPO | Admitting: Internal Medicine

## 2019-02-05 ENCOUNTER — Encounter: Payer: Self-pay | Admitting: General Surgery

## 2019-02-05 ENCOUNTER — Other Ambulatory Visit: Payer: Self-pay

## 2019-02-05 ENCOUNTER — Ambulatory Visit: Payer: BC Managed Care – PPO | Admitting: General Surgery

## 2019-02-05 VITALS — BP 110/74 | HR 80 | Temp 97.7°F | Ht 65.0 in | Wt 158.6 lb

## 2019-02-05 DIAGNOSIS — R202 Paresthesia of skin: Secondary | ICD-10-CM | POA: Diagnosis not present

## 2019-02-05 DIAGNOSIS — Z170 Estrogen receptor positive status [ER+]: Secondary | ICD-10-CM

## 2019-02-05 DIAGNOSIS — C50411 Malignant neoplasm of upper-outer quadrant of right female breast: Secondary | ICD-10-CM | POA: Diagnosis not present

## 2019-02-05 DIAGNOSIS — R200 Anesthesia of skin: Secondary | ICD-10-CM

## 2019-02-05 NOTE — Patient Instructions (Addendum)
The patient has been asked to return to the office in one yearone year with a left screening mammogram . The patient is aware to call back for any questions or concerns.

## 2019-02-05 NOTE — Progress Notes (Signed)
Patient ID: Sandra Nguyen, female   DOB: 08-14-1971, 48 y.o.   MRN: [id number]  Chief Complaint  Patient presents with  . Follow-up    HPI Sandra Nguyen is a 48 y.o. female who presents for a breast evaluation. The most recent mammogram was done on 12/26/18. Patient  Is having right hand numbness.  Patient does perform regular self breast checks and gets regular mammograms done.   The patient has been under a good deal of stress the last few months.  Her father died at age 15 after a fairly sudden decline.  She separated from her husband of many years for his lack of support during the illness her father experience.  She continues to work full-time.  She does report that she is gained a little bit of weight as she has not been exercising as much as usual.  She complains of numbness particularly in the right hand.  On rare occasion some mild discomfort on the right posterior shoulder.  HPI  Past Medical History:  Diagnosis Date  . Anemia   . Anxiety   . Atypical squamous cell changes of undetermined significance (ASCUS) on cervical cytology with negative high risk human papilloma virus (HPV) test result 06/20/2016  . Breast cancer [facility]) March 2015    Clinical T1c, N0, BRCA negative- Right  . Dysplasia of cervix, low grade (CIN 1)   . 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 Malignant neoplasm of upper-outer quadrant of female breast [facility]) August 2015   pT1b, N0; M0. 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 Menorrhagia   . Migraines    migraines  . Vitamin D deficiency     Past Surgical History:  Procedure Laterality Date  . AUGMENTATION MAMMAPLASTY Bilateral Nov 2016   Dr Tula Nakayama REMOVED 07/2016  . BREAST BIOPSY Right 02/2014   positive  . BREAST RECONSTRUCTION Right 04/14/2015   Procedure: AEROLA/NIPPLE RECONSTRUCTION WITH GRAFT;  Surgeon: Nicholaus Bloom, MD;  Location: [place];  Service: Plastics;  Laterality: Right;   . BREAST SURGERY Right July 10, 2014   Right simple mastectomy with sentinel node biopsy.  . CERVICAL BIOPSY  W/ LOOP ELECTRODE EXCISION  1998  . COLONOSCOPY WITH PROPOFOL N/A 05/04/2016   Procedure: COLONOSCOPY WITH PROPOFOL;  Surgeon: Robert Bellow, MD;  Location[facility] ENDOSCOPY;  Service: Endoscopy;  Laterality: N/A;  . CYSTOSCOPY  03/15/2018   Procedure: CYSTOSCOPY;  Surgeon: Gae Dry, MD;  Location: [facility];  Service: Gynecology;;  . DILATATION & CURETTAGE/HYSTEROSCOPY WITH MYOSURE N/A 08/03/2017   Procedure: [place];  Surgeon: Gae Dry, MD;  Location: [facility];  Service: Gynecology;  Laterality: N/A;  . ESSURE TUBAL LIGATION  09/15/2010  . EXCISIONAL HEMORRHOIDECTOMY  2015  . I and D of hemorrhoid  05/2017   Dr. Jamal Collin  . LAPAROSCOPIC HYSTERECTOMY Bilateral 03/15/2018   Procedure: HYSTERECTOMY TOTAL LAPAROSCOPIC BILATERAL SALPINGO OOPHORECTOMY;  Surgeon: Gae Dry, MD;  Location: [facility];  Service: Gynecology;  Laterality: Bilateral;  . MASTECTOMY Right 07/2014   chemo and herceptin tx  . PORTACATH PLACEMENT    . TUBAL LIGATION      Family History  Problem Relation Age of Onset  . Cervical cancer Maternal Aunt 65  . Hypertension Mother   . Heart disease Maternal Grandmother   . Breast cancer Paternal Aunt 31  . Ovarian cancer Neg Hx   . Colon cancer Neg Hx     Social History Social  History   Tobacco Use  . Smoking status: Never Smoker  . Smokeless tobacco: Never Used  Substance Use Topics  . Alcohol use: No  . Drug use: No    Allergies  Allergen Reactions  . Cephalexin Rash and Itching    Current Outpatient Medications  Medication Sig Dispense Refill  . anastrozole (ARIMIDEX) 1 MG tablet Take 1 tablet (1 mg total) by mouth daily. 90 tablet 3  . Biotin 10000 MCG TABS Take 10,000 mcg by mouth at bedtime.    . clonazePAM (KLONOPIN) 0.5 MG tablet Take 1 mg by mouth at bedtime.     . cloNIDine  (CATAPRES) 0.1 MG tablet Take 1 tablet (0.1 mg total) by mouth daily. For hot flashes 30 tablet 3  . levocetirizine (XYZAL) 5 MG tablet Take 5 mg by mouth every evening.     . mirtazapine (REMERON) 30 MG tablet Take 1 tablet (30 mg total) by mouth at bedtime. 90 tablet 3  . Vitamin D, Ergocalciferol, (DRISDOL) 50000 units CAPS capsule TAKE ONE CAPSULE BY MOUTH ONE TIME PER WEEK 12 capsule 2   No current facility-administered medications for this visit.     Review of Systems Review of Systems  Constitutional: Negative.   Respiratory: Negative.   Cardiovascular: Negative.     Blood pressure 110/74, pulse 80, temperature 97.7 F (36.5 C), temperature source Skin, height 5' 5" (1.651 m), weight 158 lb 9.6 oz (71.9 kg), last menstrual period 02/15/2018, SpO2 98 %.  Physical Exam Physical Exam Constitutional:      Appearance: She is well-developed.  Eyes:     General: No scleral icterus.    Conjunctiva/sclera: Conjunctivae normal.  Neck:     Musculoskeletal: Neck supple.  Cardiovascular:     Rate and Rhythm: Normal rate and regular rhythm.     Heart sounds: Normal heart sounds.  Pulmonary:     Effort: Pulmonary effort is normal.     Breath sounds: Normal breath sounds.  Chest:     Breasts:        Right: No inverted nipple, mass, nipple discharge, skin change or tenderness.        Left: No inverted nipple, mass, nipple discharge, skin change or tenderness.    Musculoskeletal:       Arms:  Lymphadenopathy:     Cervical: No cervical adenopathy.     Upper Body:     Right upper body: No supraclavicular or axillary adenopathy.     Left upper body: No supraclavicular or axillary adenopathy.  Skin:    General: Skin is warm and dry.  Neurological:     Mental Status: She is alert and oriented to person, place, and time.   Measurement of the upper extremities were completed at a location 15 cm above as well as 10 and 20 cm below the olecranon process.  February 05, 2019: Right:  34.5, 29, 22.  Centimeters. Left: 35, 28, 21 cm.  Data Reviewed Screening right breast mammogram of December 26, 2018: BI-RADS-1. The most recent CA 27-29 had shown interval rise from 21.95 months ago, 25.84 months ago and 39.4 more 1 month ago.  Comprehensive metabolic panel, CBC of December 31, 2018: No significant interval change.  CT of the chest abdomen and pelvis dated January 23, 2019 reviewed.  No evidence of metastatic disease.  Bone density of December 26, 2018: T score of -0.9; normal.  Assessment Unremarkable breast exam.  Rising CA 27-29.  Plan  PET scan is pending insurance review.  Reviewed  lower limit of resolution for PET scan.  We will plan for follow-up in 1 year, barring any interventions required before that time. The patient has been asked to return to the office in one yearone year with a left screening mammogram . The patient is aware to call back for any questions or concerns.  HPI, Physical Exam, Assessment and Plan have been scribed under the direction and in the presence of Hervey Ard, MD.  Gaspar Cola, CMA   Forest Gleason Orva Riles 02/05/2019, 7:27 PM

## 2019-02-06 ENCOUNTER — Encounter (HOSPITAL_COMMUNITY): Payer: Self-pay

## 2019-02-06 NOTE — Progress Notes (Signed)
Social worker provided counseling services to patient on 01/31/2019 at the cancer center.

## 2019-02-20 ENCOUNTER — Encounter
Admission: RE | Admit: 2019-02-20 | Discharge: 2019-02-20 | Disposition: A | Discharge status: Home / Self Care | Payer: BC Managed Care – PPO | Source: Ambulatory Visit | Attending: Internal Medicine | Admitting: Internal Medicine

## 2019-02-20 ENCOUNTER — Other Ambulatory Visit: Payer: Self-pay

## 2019-02-20 DIAGNOSIS — Z170 Estrogen receptor positive status [ER+]: Secondary | ICD-10-CM | POA: Diagnosis present

## 2019-02-20 DIAGNOSIS — C50411 Malignant neoplasm of upper-outer quadrant of right female breast: Secondary | ICD-10-CM | POA: Insufficient documentation

## 2019-02-20 LAB — GLUCOSE, CAPILLARY: Glucose-Capillary: 85 mg/dL (ref 70–99)

## 2019-02-20 MED ORDER — FLUDEOXYGLUCOSE F - 18 (FDG) INJECTION
8.7200 | Freq: Once | INTRAVENOUS | Status: AC | PRN
  Administered 2019-02-20: 8.72 via INTRAVENOUS

## 2019-02-21 ENCOUNTER — Telehealth: Payer: Self-pay | Admitting: Internal Medicine

## 2019-02-21 DIAGNOSIS — C50411 Malignant neoplasm of upper-outer quadrant of right female breast: Secondary | ICD-10-CM

## 2019-02-21 DIAGNOSIS — Z170 Estrogen receptor positive status [ER+]: Principal | ICD-10-CM

## 2019-02-21 NOTE — Telephone Encounter (Signed)
I Called pt and reviewed the results of the PET scan as negative.   Recommend follow up with me in 2 months/ cbc/cmp/cea/ ca-27-29/ca-15-3-   Keep the July appt for now- Dr.B

## 2019-02-21 NOTE — Addendum Note (Signed)
Addended by: Sabino Gasser on: 02/21/2019 10:39 AM   Modules accepted: Orders

## 2019-03-28 IMAGING — US US TRANSVAGINAL NON-OB
1 series · 13 of 25 positions shown · non-contrast
Comparison: None in PACs

CLINICAL DATA: Two weeks of pelvic pain the patient is
postmenopausal and is on tamoxifen for breast malignancy.

EXAM:
TRANSABDOMINAL AND TRANSVAGINAL ULTRASOUND OF PELVIS
TECHNIQUE: Both transabdominal and transvaginal ultrasound examinations of the
pelvis were performed. Transabdominal technique was performed for
global imaging of the pelvis including uterus, ovaries, adnexal
regions, and pelvic cul-de-sac. It was necessary to proceed with
endovaginal exam following the transabdominal exam to visualize the
endometrium and ovaries.

[Series 1: us transvaginal non-ob · 0.22mm/px · 13 of 175 slices shown]
[im 1/175]
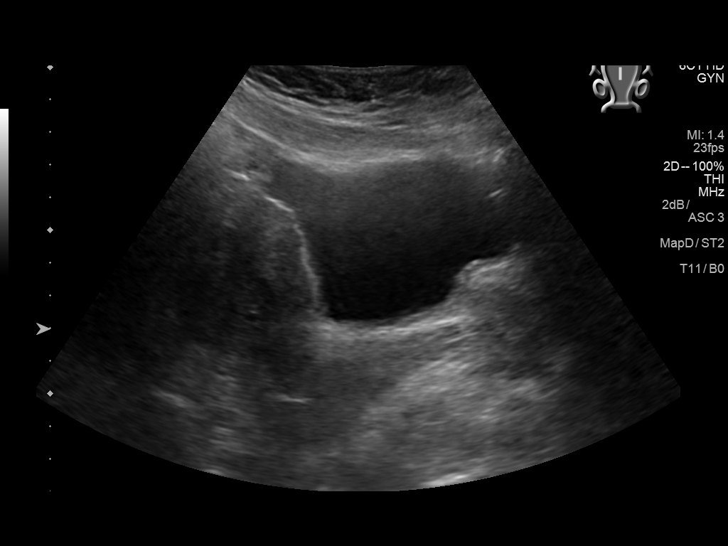
[im 15/175]
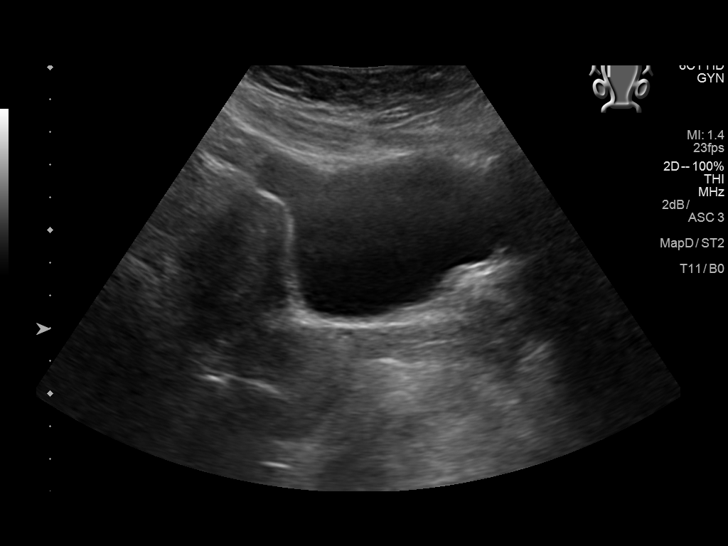
[im 30/175]
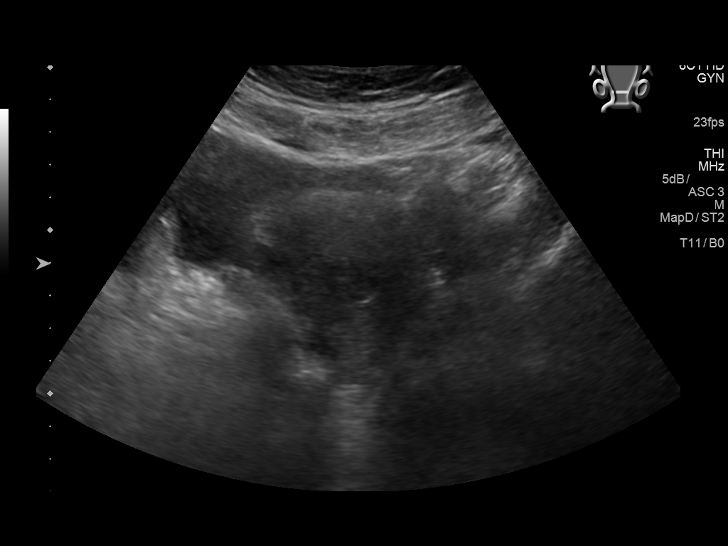
[im 44/175]
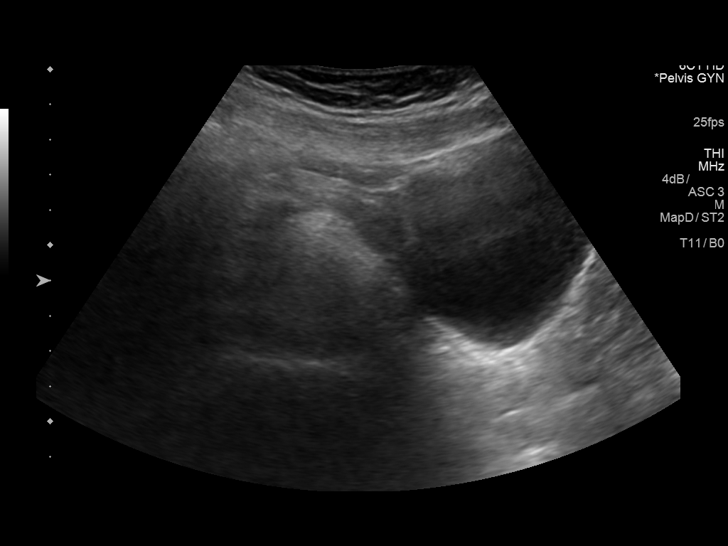
[im 59/175]
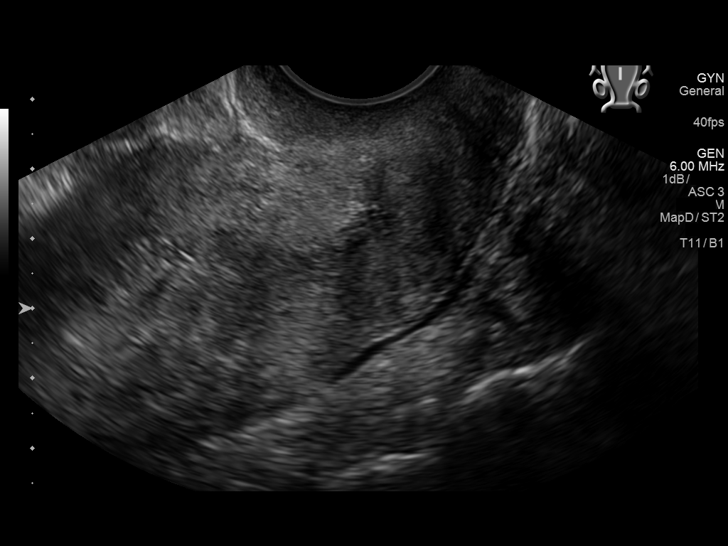
[im 73/175]
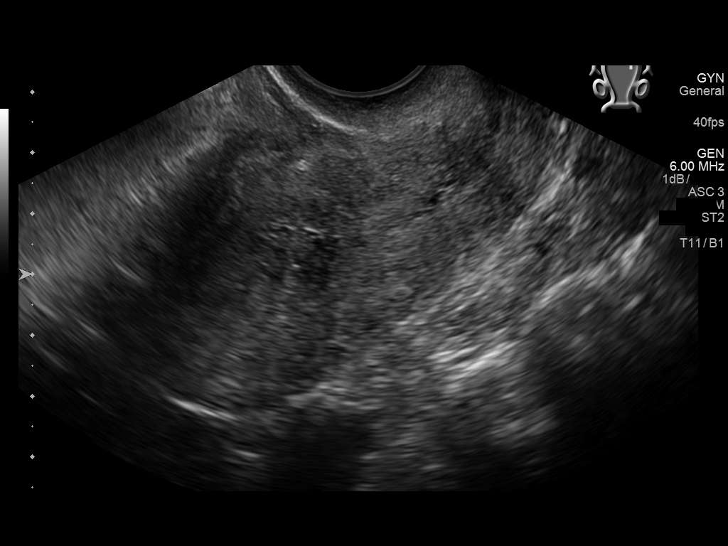
[im 88/175]
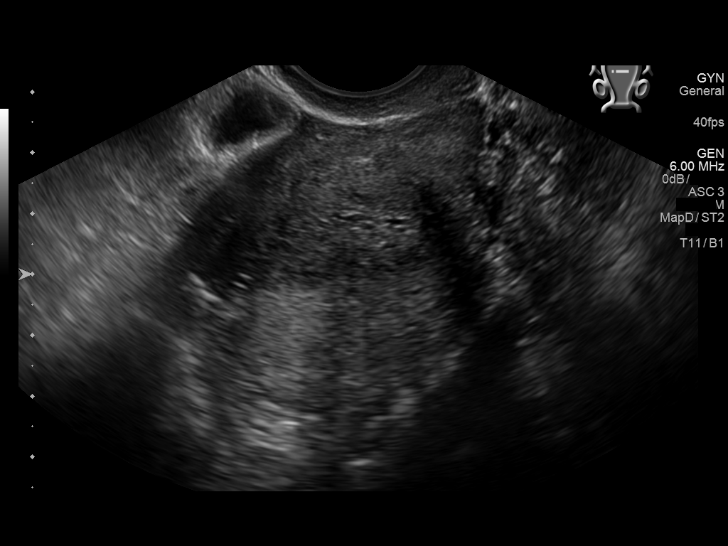
[im 102/175]
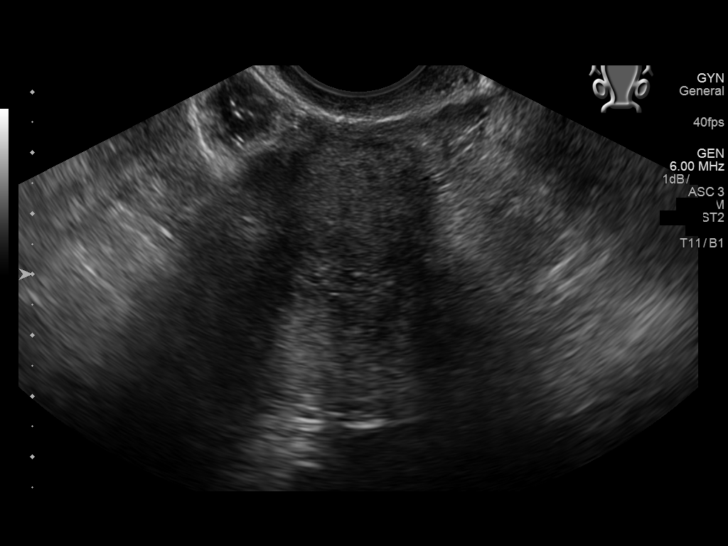
[im 117/175]
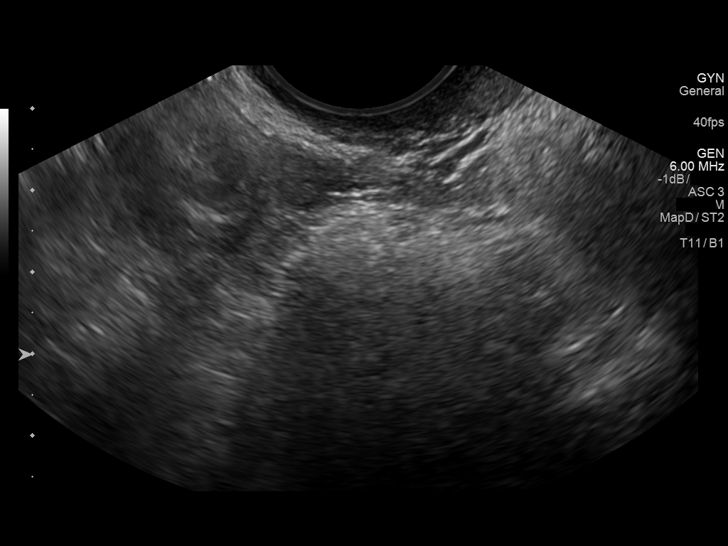
[im 131/175]
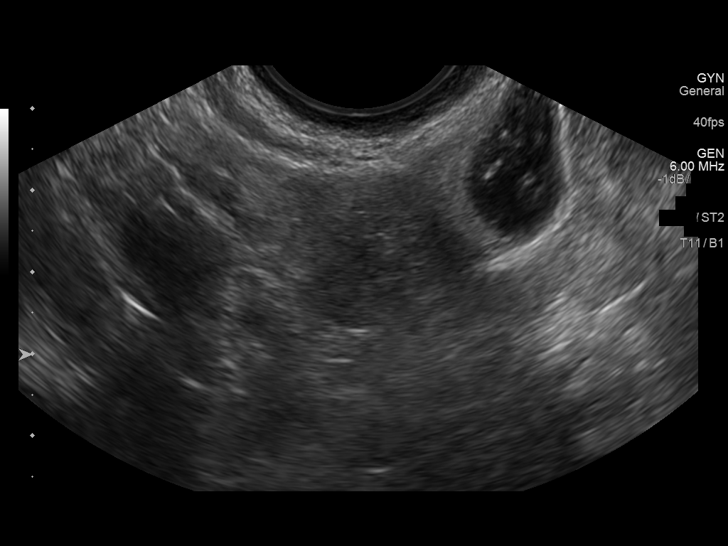
[im 146/175]
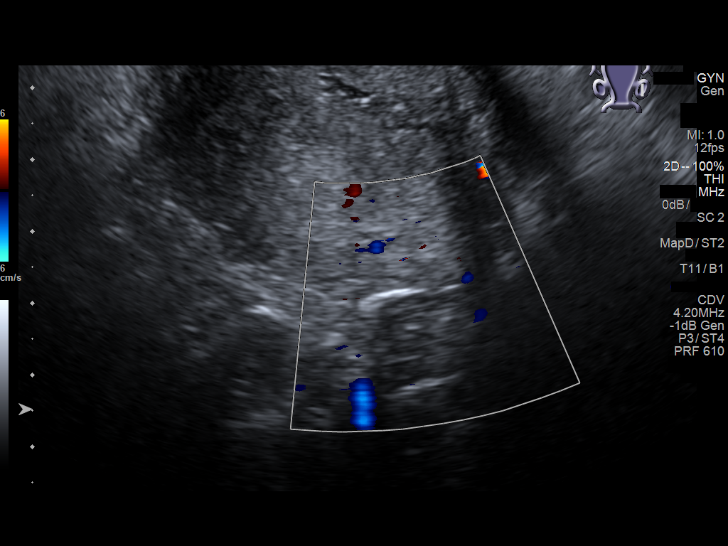
[im 160/175]
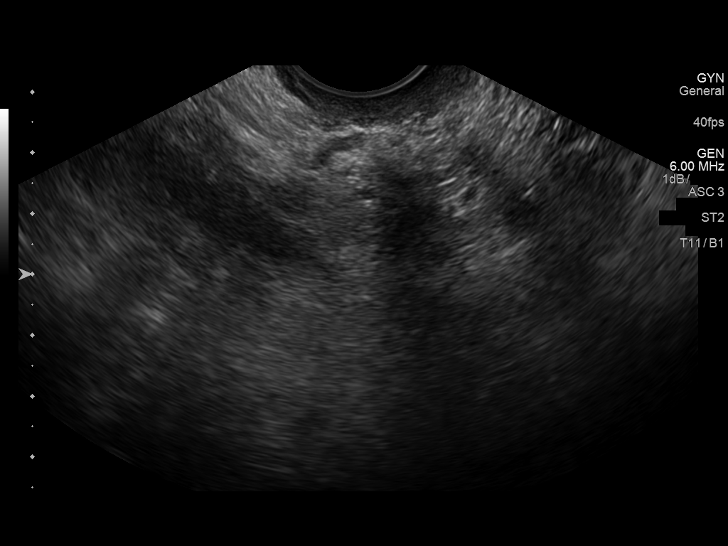
[im 175/175]
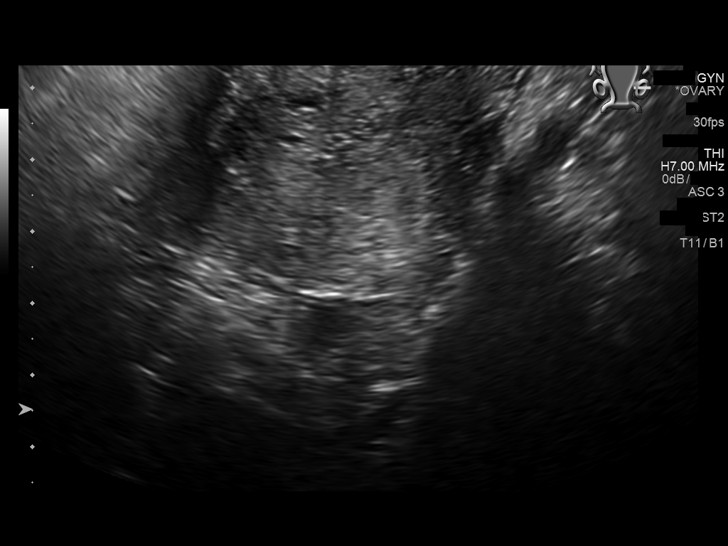

[13 of 25 positions shown; findings below may reference images not displayed]

FINDINGS: Uterus

Measurements: 7.5 x 3.9 x 4.5 cm. The myometrial echotexture is
heterogeneous. No discrete fibroids or other masses are observed.

Endometrium

Thickness: 11.7 cm. The endometrium demonstrates multiple complex
appearing fluid filled foci with pigment largest measuring just over
4 mm in diameter. No discrete solid endometrial mass is observed.

Right ovary

Measurements: 2.1 x 1.1 x 1.4 cm. Normal appearance/no adnexal mass.

Left ovary

Measurements: 2.1 x 1.4 x 1.5 cm. Normal appearance/no adnexal mass.

Other findings

There is a trace of free pelvic fluid.
IMPRESSION: Thickening of the endometrial with heterogeneous contour in tiny
fluid collections. No vaginal bleeding was reported but given the
appearance of the endometrium, endometrial sampling is indicated to
exclude carcinoma. If results are benign, sonohysterogram should be
considered for focal lesion work-up. (Ref: Radiological Reasoning:
Algorithmic Workup of Abnormal Vaginal Bleeding with Endovaginal
Sonography and Sonohysterography. AJR 6994; 191:S68-73)

Normal appearance of the ovaries and adnexal structures.

## 2019-04-16 ENCOUNTER — Other Ambulatory Visit: Payer: Self-pay | Admitting: Internal Medicine

## 2019-04-22 ENCOUNTER — Other Ambulatory Visit: Payer: Self-pay

## 2019-04-22 ENCOUNTER — Inpatient Hospital Stay: Payer: BC Managed Care – PPO | Attending: Internal Medicine

## 2019-04-22 DIAGNOSIS — Z9011 Acquired absence of right breast and nipple: Secondary | ICD-10-CM | POA: Insufficient documentation

## 2019-04-22 DIAGNOSIS — Z170 Estrogen receptor positive status [ER+]: Secondary | ICD-10-CM | POA: Diagnosis not present

## 2019-04-22 DIAGNOSIS — Z79811 Long term (current) use of aromatase inhibitors: Secondary | ICD-10-CM | POA: Insufficient documentation

## 2019-04-22 DIAGNOSIS — C50411 Malignant neoplasm of upper-outer quadrant of right female breast: Secondary | ICD-10-CM | POA: Diagnosis present

## 2019-04-22 LAB — CBC WITH DIFFERENTIAL/PLATELET
Abs Immature Granulocytes: 0.02 10*3/uL (ref 0.00–0.07)
Basophils Absolute: 0 10*3/uL (ref 0.0–0.1)
Basophils Relative: 0 %
Eosinophils Absolute: 0.1 10*3/uL (ref 0.0–0.5)
Eosinophils Relative: 2 %
HCT: 35.8 % — ABNORMAL LOW (ref 36.0–46.0)
Hemoglobin: 11.7 g/dL — ABNORMAL LOW (ref 12.0–15.0)
Immature Granulocytes: 0 %
Lymphocytes Relative: 38 %
Lymphs Abs: 1.8 10*3/uL (ref 0.7–4.0)
MCH: 28.3 pg (ref 26.0–34.0)
MCHC: 32.7 g/dL (ref 30.0–36.0)
MCV: 86.5 fL (ref 80.0–100.0)
Monocytes Absolute: 0.5 10*3/uL (ref 0.1–1.0)
Monocytes Relative: 10 %
Neutro Abs: 2.4 10*3/uL (ref 1.7–7.7)
Neutrophils Relative %: 50 %
Platelets: 270 10*3/uL (ref 150–400)
RBC: 4.14 MIL/uL (ref 3.87–5.11)
RDW: 13.3 % (ref 11.5–15.5)
WBC: 4.9 10*3/uL (ref 4.0–10.5)
nRBC: 0 % (ref 0.0–0.2)

## 2019-04-22 LAB — COMPREHENSIVE METABOLIC PANEL
ALT: 20 U/L (ref 0–44)
AST: 29 U/L (ref 15–41)
Albumin: 4.3 g/dL (ref 3.5–5.0)
Alkaline Phosphatase: 78 U/L (ref 38–126)
Anion gap: 8 (ref 5–15)
BUN: 18 mg/dL (ref 6–20)
CO2: 22 mmol/L (ref 22–32)
Calcium: 9.2 mg/dL (ref 8.9–10.3)
Chloride: 107 mmol/L (ref 98–111)
Creatinine, Ser: 0.79 mg/dL (ref 0.44–1.00)
GFR calc Af Amer: >60 mL/min (ref >60)
GFR calc non Af Amer: >60 mL/min (ref >60)
Glucose, Bld: 95 mg/dL (ref 70–99)
Potassium: 3.9 mmol/L (ref 3.5–5.1)
Sodium: 137 mmol/L (ref 135–145)
Total Bilirubin: 0.3 mg/dL (ref 0.3–1.2)
Total Protein: 7.3 g/dL (ref 6.5–8.1)

## 2019-04-23 ENCOUNTER — Inpatient Hospital Stay (HOSPITAL_BASED_OUTPATIENT_CLINIC_OR_DEPARTMENT_OTHER): Payer: BC Managed Care – PPO | Admitting: Internal Medicine

## 2019-04-23 ENCOUNTER — Encounter: Payer: Self-pay | Admitting: Internal Medicine

## 2019-04-23 ENCOUNTER — Inpatient Hospital Stay: Payer: BC Managed Care – PPO

## 2019-04-23 DIAGNOSIS — Z79811 Long term (current) use of aromatase inhibitors: Secondary | ICD-10-CM

## 2019-04-23 DIAGNOSIS — Z9011 Acquired absence of right breast and nipple: Secondary | ICD-10-CM

## 2019-04-23 DIAGNOSIS — Z9221 Personal history of antineoplastic chemotherapy: Secondary | ICD-10-CM

## 2019-04-23 DIAGNOSIS — C50411 Malignant neoplasm of upper-outer quadrant of right female breast: Secondary | ICD-10-CM | POA: Diagnosis not present

## 2019-04-23 DIAGNOSIS — M1990 Unspecified osteoarthritis, unspecified site: Secondary | ICD-10-CM

## 2019-04-23 DIAGNOSIS — Z170 Estrogen receptor positive status [ER+]: Secondary | ICD-10-CM | POA: Diagnosis not present

## 2019-04-23 LAB — CANCER ANTIGEN 27.29: CA 27.29: 40.9 U/mL — ABNORMAL HIGH (ref 0.0–38.6)

## 2019-04-23 LAB — CEA: CEA: 1 ng/mL (ref 0.0–4.7)

## 2019-04-23 LAB — CANCER ANTIGEN 15-3: CA 15-3: 33.4 U/mL — ABNORMAL HIGH (ref 0.0–25.0)

## 2019-04-23 NOTE — Assessment & Plan Note (Signed)
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  See below  # Continue anastrozole 1 mg a day [patient had BSO].  Tolerating fairly well except for mild joint pains see below.  #Slightly abnormal tumor markers-labs from May show slightly elevated; but overall stable.  Patient continues to be asymptomatic otherwise from malignancy standpoint.  Recent work-up including CT scan/PET scan April 2020 NED.  Recommend repeating labs-in 2 months.  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  Continue glucosamine/continue vitamin D.  # DISPOSITION:  #Keep the appointments as planned in July 27th-MD labs; CBC CMP CA-27-29/ca 15-3- Dr.B

## 2019-04-23 NOTE — Progress Notes (Signed)
I connected with Sandra Nguyen on 04/23/19 at  2:15 PM EDT by video enabled telemedicine visit and verified that I am speaking with the correct person using two identifiers.  I discussed the limitations, risks, security and privacy concerns of performing an evaluation and management service by telemedicine and the availability of in-person appointments. I also discussed with the patient that there may be a patient responsible charge related to this service. The patient expressed understanding and agreed to proceed.    Other persons participating in the visit and their role in the encounter: None Patient's location: Home Provider's location: Home  Oncology History   1. 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  Estrogen receptor positive.  Progesterone receptor positive.  HER-2/neu receptor amplified. 2. Started on neoadjuvant chemotherapy,  Taxotere, carboplatin and Herceptin and perjeta (March 05, 2014) 3. Patient does not have any clinically significant BRCA mutation Has been on and to [place] Mutation of unknown significance. . 4. 4 cycles of chemotherapy with TCH,perjeta previous did not June of 201 5.  Because of progressing side effect remaining 2 cycles of chemotherapy was not given but patient continued Herceptin and aperjeta  5. Status post right breast simple mastectomy in August of 2015 ypT1  ypN0 stage IB 6.patient started on tamoxifen in October of 2015 along with maintenance Herceptin therapy Patient is going for reconstructive surgery on October 21, 2014 final reconstructive surgery was done in February of 2016 [name] finished Herceptin therapy on March 11, 2015  # Sep 2019- STOP TAM; start Arimidex 57m/day.   # TAH & BSO [Dr.Harris; April 2019- menorrhagia] -------------------------------------------------    DIAGNOSIS: Breast cancer  STAGE:   II     ;GOALS: Cure  CURRENT/MOST RECENT THERAPY: Aromatase inhibitor       Carcinoma of upper-outer quadrant of right breast in female, estrogen receptor positive ([place]     Chief Complaint: Breast cancer   History of present illness:Sandra Nguyen 48y.o.  female with history of stage II breast cancer ER PR positive HER-2 positive currently on anastrozole for follow-up.  Patient had extensive work-up including CT scan chest and pelvis and also PET scan for slightly abnormal tumor markers approximately 2 months ago.  All the work-up is negative for any cancer recurrence.  Patient denies any unusual headaches nausea vomiting or shortness of breath or cough.  Complains of mild to moderate joint pains.  Mostly in the hand joints.  She has been starting to take glucosamine and also high-dose vitamin D.  Observation/objective: Hemoglobin 11.6.  CA-27-29 40/slightly abnormal.  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  See below  # Continue anastrozole 1 mg a day [patient had BSO].  Tolerating fairly well except for mild joint pains see below.  #Slightly abnormal tumor markers-labs from May show slightly elevated; but overall stable.  Patient continues to be asymptomatic otherwise from malignancy standpoint.  Recent work-up including CT scan/PET scan April 2020 NED.  Recommend repeating labs-in 2 months.  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  Continue glucosamine/continue vitamin D.  # DISPOSITION:  #Keep the appointments as planned in July 27th-MD labs; CBC CMP CA-27-29/ca 15-3- Dr.B  Follow-up instructions:  I discussed the assessment and treatment plan with the patient.  The patient was provided an opportunity to ask questions and  all were answered.  The patient agreed with the plan and demonstrated understanding of instructions.  The  patient was advised to call back or seek an in person evaluation if the symptoms worsen or if the condition fails to improve as anticipated.   Dr. Charlaine Dalton [facility] 04/23/2019 2:59 PM

## 2019-05-08 ENCOUNTER — Telehealth: Payer: Self-pay | Admitting: General Surgery

## 2019-05-08 ENCOUNTER — Encounter: Payer: Self-pay | Admitting: General Surgery

## 2019-05-08 NOTE — Telephone Encounter (Signed)
1 month history of right thumb/ wrist pain. Worse when writing or gripping a cup. No trauma. Progressive. Will arrange for ortho referral.

## 2019-05-27 ENCOUNTER — Other Ambulatory Visit: Payer: Self-pay | Admitting: Internal Medicine

## 2019-07-01 ENCOUNTER — Inpatient Hospital Stay: Payer: BC Managed Care – PPO | Attending: Internal Medicine

## 2019-07-01 ENCOUNTER — Other Ambulatory Visit: Payer: Self-pay

## 2019-07-01 ENCOUNTER — Inpatient Hospital Stay (HOSPITAL_BASED_OUTPATIENT_CLINIC_OR_DEPARTMENT_OTHER): Payer: BC Managed Care – PPO | Admitting: Internal Medicine

## 2019-07-01 ENCOUNTER — Encounter: Payer: Self-pay | Admitting: Internal Medicine

## 2019-07-01 ENCOUNTER — Encounter: Payer: Self-pay | Admitting: General Surgery

## 2019-07-01 DIAGNOSIS — Z170 Estrogen receptor positive status [ER+]: Secondary | ICD-10-CM | POA: Insufficient documentation

## 2019-07-01 DIAGNOSIS — Z9011 Acquired absence of right breast and nipple: Secondary | ICD-10-CM | POA: Diagnosis not present

## 2019-07-01 DIAGNOSIS — M1990 Unspecified osteoarthritis, unspecified site: Secondary | ICD-10-CM | POA: Diagnosis not present

## 2019-07-01 DIAGNOSIS — Z79811 Long term (current) use of aromatase inhibitors: Secondary | ICD-10-CM | POA: Diagnosis not present

## 2019-07-01 DIAGNOSIS — C50411 Malignant neoplasm of upper-outer quadrant of right female breast: Secondary | ICD-10-CM | POA: Insufficient documentation

## 2019-07-01 DIAGNOSIS — Z9221 Personal history of antineoplastic chemotherapy: Secondary | ICD-10-CM

## 2019-07-01 LAB — COMPREHENSIVE METABOLIC PANEL
ALT: 19 U/L (ref 0–44)
AST: 23 U/L (ref 15–41)
Albumin: 4.4 g/dL (ref 3.5–5.0)
Alkaline Phosphatase: 81 U/L (ref 38–126)
Anion gap: 11 (ref 5–15)
BUN: 16 mg/dL (ref 6–20)
CO2: 24 mmol/L (ref 22–32)
Calcium: 9.3 mg/dL (ref 8.9–10.3)
Chloride: 104 mmol/L (ref 98–111)
Creatinine, Ser: 0.92 mg/dL (ref 0.44–1.00)
GFR calc Af Amer: >60 mL/min (ref >60)
GFR calc non Af Amer: >60 mL/min (ref >60)
Glucose, Bld: 116 mg/dL — ABNORMAL HIGH (ref 70–99)
Potassium: 3.6 mmol/L (ref 3.5–5.1)
Sodium: 139 mmol/L (ref 135–145)
Total Bilirubin: 0.4 mg/dL (ref 0.3–1.2)
Total Protein: 7.7 g/dL (ref 6.5–8.1)

## 2019-07-01 LAB — CBC WITH DIFFERENTIAL/PLATELET
Abs Immature Granulocytes: 0.03 10*3/uL (ref 0.00–0.07)
Basophils Absolute: 0 10*3/uL (ref 0.0–0.1)
Basophils Relative: 0 %
Eosinophils Absolute: 0.1 10*3/uL (ref 0.0–0.5)
Eosinophils Relative: 2 %
HCT: 36.4 % (ref 36.0–46.0)
Hemoglobin: 11.4 g/dL — ABNORMAL LOW (ref 12.0–15.0)
Immature Granulocytes: 1 %
Lymphocytes Relative: 42 %
Lymphs Abs: 2.6 10*3/uL (ref 0.7–4.0)
MCH: 27.7 pg (ref 26.0–34.0)
MCHC: 31.3 g/dL (ref 30.0–36.0)
MCV: 88.3 fL (ref 80.0–100.0)
Monocytes Absolute: 0.5 10*3/uL (ref 0.1–1.0)
Monocytes Relative: 8 %
Neutro Abs: 2.9 10*3/uL (ref 1.7–7.7)
Neutrophils Relative %: 47 %
Platelets: 238 10*3/uL (ref 150–400)
RBC: 4.12 MIL/uL (ref 3.87–5.11)
RDW: 13.8 % (ref 11.5–15.5)
WBC: 6.2 10*3/uL (ref 4.0–10.5)
nRBC: 0 % (ref 0.0–0.2)

## 2019-07-01 NOTE — Progress Notes (Signed)
[place] OFFICE PROGRESS NOTE  Patient Care Team: [name], MD as PCP - General (Family Medicine) Dear, [name], MD (Inactive) (Family Medicine) Bary Castilla, Forest Gleason, MD (General Surgery) Forest Gleason, MD (Inactive) (Unknown Physician Specialty) Thornton Park, MD as Consulting Physician (Orthopedic Surgery)  Cancer Staging Carcinoma of upper-outer quadrant of right breast in female, estrogen receptor positive [facility]) Staging form: Breast, AJCC 7th Edition - Clinical: Stage IIA (T2, N0, M0) - Signed by Forest Gleason, MD on 05/09/2015    Oncology History Overview Note  1. 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  Estrogen receptor positive.  Progesterone receptor positive.  HER-2/neu receptor amplified. 2. Started on neoadjuvant chemotherapy,  Taxotere, carboplatin and Herceptin and perjeta (March 05, 2014) 3. Patient does not have any clinically significant BRCA mutation Has been on and to [place] Mutation of unknown significance. . 4. 4 cycles of chemotherapy with TCH,perjeta previous did not June of 201 5.  Because of progressing side effect remaining 2 cycles of chemotherapy was not given but patient continued Herceptin and aperjeta  5. Status post right breast simple mastectomy in August of 2015 ypT1  ypN0 stage IB 6.patient started on tamoxifen in October of 2015 along with maintenance Herceptin therapy Patient is going for reconstructive surgery on October 21, 2014 final reconstructive surgery was done in February of 2016 [name] finished Herceptin therapy on March 11, 2015  # Sep 2019- STOP TAM; start Arimidex 67m/day.   # TAH & BSO [Dr.Harris; April 2019- menorrhagia] -------------------------------------------------    DIAGNOSIS: Breast cancer  STAGE:   II     ;GOALS: Cure  CURRENT/MOST RECENT THERAPY: Aromatase inhibitor    Carcinoma of upper-outer quadrant of right breast in female, estrogen receptor  positive ([facility]   INTERVAL HISTORY:  Sandra Nguyen 48y.o.  female right-sided stage II ER PR positive HER-2/neu positive status post mastectomy currently on anastrozole [TAH/BSO] is here for follow-up.  Patient denies any lumps or bumps denies any nausea vomiting.  No headaches.  Intermittent hot flashes.  Chronic mild joint pains.  Noted to have a " knot left foot sole".  No pain.  Review of Systems  Constitutional: Positive for malaise/fatigue. Negative for chills, diaphoresis and fever.  HENT: Negative.  Negative for nosebleeds and sore throat.   Eyes: Negative.  Negative for double vision.  Respiratory: Negative.  Negative for cough, hemoptysis, sputum production, shortness of breath and wheezing.   Cardiovascular: Negative.  Negative for chest pain, palpitations, orthopnea and leg swelling.  Gastrointestinal: Negative.  Negative for abdominal pain, blood in stool, constipation, diarrhea, heartburn, melena, nausea and vomiting.  Genitourinary: Negative.  Negative for dysuria, frequency and urgency.  Musculoskeletal: Positive for joint pain and myalgias. Negative for back pain.  Skin: Negative.  Negative for itching and rash.  Neurological: Negative for dizziness, tingling, focal weakness and weakness.  Endo/Heme/Allergies: Negative.  Does not bruise/bleed easily.  Psychiatric/Behavioral: Negative.  Negative for depression. The patient is not nervous/anxious and does not have insomnia.      REVIEW OF SYSTEMS:  A complete 10 point review of system is done which is negative except mentioned above/history of present illness.   PAST MEDICAL HISTORY :  Past Medical History:  Diagnosis Date  . Anemia   . Anxiety   . Atypical squamous cell changes of undetermined significance (ASCUS) on cervical cytology with negative high risk human papilloma virus (HPV) test result 06/20/2016  . Breast cancer ([facility] March 2015  Clinical T1c, N0, BRCA negative- Right  . Dysplasia of cervix, low  grade (CIN 1)   . 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 Malignant neoplasm of upper-outer quadrant of female breast [facility]) August 2015   pT1b, N0; M0. 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 Menorrhagia   . Migraines    migraines  . Vitamin D deficiency     PAST SURGICAL HISTORY :   Past Surgical History:  Procedure Laterality Date  . AUGMENTATION MAMMAPLASTY Bilateral Nov 2016   Dr Tula Nakayama REMOVED 07/2016  . BREAST BIOPSY Right 02/2014   positive  . BREAST RECONSTRUCTION Right 04/14/2015   Procedure: AEROLA/NIPPLE RECONSTRUCTION WITH GRAFT;  Surgeon: Nicholaus Bloom, MD;  Location: [place];  Service: Plastics;  Laterality: Right;  . BREAST SURGERY Right July 10, 2014   Right simple mastectomy with sentinel node biopsy.  . CERVICAL BIOPSY  W/ LOOP ELECTRODE EXCISION  1998  . COLONOSCOPY WITH PROPOFOL N/A 05/04/2016   Procedure: COLONOSCOPY WITH PROPOFOL;  Surgeon: Robert Bellow, MD;  Location[facility] END[facility]COPY;  Service: Endoscopy;  Laterality: N/A;  . CYSTOSCOPY  03/15/2018   Procedure: CYSTOSCOPY;  Surgeon: Gae Dry, MD;  Location: [facility] ORS;  Service: Gynecology;;  . DILATATION & CURETTAGE/HYSTEROSCOPY WITH MYOSURE N/A 08/03/2017   Procedure: [place];  Surgeon: Gae Dry, MD;  Location: [facility] ORS;  Service: Gynecology;  Laterality: N/A;  . ESSURE TUBAL LIGATION  09/15/2010  . EXCISIONAL HEMORRHOIDECTOMY  2015  . I and D of hemorrhoid  05/2017   Dr. Jamal Collin  . LAPAROSCOPIC HYSTERECTOMY Bilateral 03/15/2018   Procedure: HYSTERECTOMY TOTAL LAPAROSCOPIC BILATERAL SALPINGO OOPHORECTOMY;  Surgeon: Gae Dry, MD;  Location: [facility] ORS;  Service: Gynecology;  Laterality: Bilateral;  . MASTECTOMY Right 07/2014   chemo and herceptin tx  . PORTACATH PLACEMENT    . TUBAL LIGATION      FAMILY HISTORY :   Family History  Problem Relation Age of Onset  . Cervical cancer  Maternal Aunt 65  . Hypertension Mother   . Heart disease Maternal Grandmother   . Breast cancer Paternal Aunt 7  . Ovarian cancer Neg Hx   . Colon cancer Neg Hx     SOCIAL HISTORY:   Social History   Tobacco Use  . Smoking status: Never Smoker  . Smokeless tobacco: Never Used  Substance Use Topics  . Alcohol use: No  . Drug use: No    ALLERGIES:  is allergic to cephalexin.  MEDICATIONS:  Current Outpatient Medications  Medication Sig Dispense Refill  . anastrozole (ARIMIDEX) 1 MG tablet Take 1 tablet (1 mg total) by mouth daily. 90 tablet 3  . Biotin 10000 MCG TABS Take 10,000 mcg by mouth at bedtime.    . clonazePAM (KLONOPIN) 0.5 MG tablet Take 1 mg by mouth at bedtime.     . cloNIDine (CATAPRES) 0.1 MG tablet TAKE 1 TABLET (0.1 MG TOTAL) BY MOUTH DAILY. FOR HOT FLASHES 90 tablet 1  . levocetirizine (XYZAL) 5 MG tablet Take 5 mg by mouth every evening.     . mirtazapine (REMERON) 30 MG tablet Take 1 tablet (30 mg total) by mouth at bedtime. 90 tablet 3  . Vitamin D, Ergocalciferol, (DRISDOL) 1.25 MG (50000 UT) CAPS capsule TAKE ONE CAPSULE BY MOUTH ONCE A WEEK 12 capsule 2   No current facility-administered medications for this visit.     PHYSICAL EXAMINATION: ECOG PERFORMANCE STATUS: 0 - Asymptomatic  BP 125/81  Pulse 76   Temp 98.5 F (36.9 C) (Tympanic)   Resp 20   Ht 5' 3" (1.6 m)   Wt 168 lb 3.2 oz (76.3 kg)   LMP 02/15/2018 (LMP Unknown)   BMI 29.80 kg/m   Filed Weights   07/01/19 1019  Weight: 168 lb 3.2 oz (76.3 kg)   Physical Exam  Constitutional: She is oriented to person, place, and time and well-developed, well-nourished, and in no distress.  HENT:  Head: Normocephalic and atraumatic.  Mouth/Throat: Oropharynx is clear and moist. No oropharyngeal exudate.  Eyes: Pupils are equal, round, and reactive to light.  Neck: Normal range of motion. Neck supple.  Cardiovascular: Normal rate and regular rhythm.  Pulmonary/Chest: No respiratory  distress. She has no wheezes.  Abdominal: Soft. Bowel sounds are normal. She exhibits no distension and no mass. There is no abdominal tenderness. There is no rebound and no guarding.  Musculoskeletal: Normal range of motion.        General: No tenderness or edema.  Neurological: She is alert and oriented to person, place, and time.  Skin: Skin is warm.  Psychiatric: Affect normal.     LABORATORY DATA:  I have reviewed the data as listed    Component Value Date/Time   NA 139 07/01/2019 1005   NA 143 12/31/2014 1313   K 3.6 07/01/2019 1005   K 3.5 12/31/2014 1313   CL 104 07/01/2019 1005   CL 110 (H) 12/31/2014 1313   CO2 24 07/01/2019 1005   CO2 28 12/31/2014 1313   GLUCOSE 116 (H) 07/01/2019 1005   GLUCOSE 124 (H) 12/31/2014 1313   BUN 16 07/01/2019 1005   BUN 13 12/31/2014 1313   CREATININE 0.92 07/01/2019 1005   CREATININE 0.89 12/31/2014 1313   CALCIUM 9.3 07/01/2019 1005   CALCIUM 8.4 (L) 12/31/2014 1313   PROT 7.7 07/01/2019 1005   PROT 6.9 12/31/2014 1313   ALBUMIN 4.4 07/01/2019 1005   ALBUMIN 3.5 12/31/2014 1313   AST 23 07/01/2019 1005   AST 21 12/31/2014 1313   ALT 19 07/01/2019 1005   ALT 23 12/31/2014 1313   ALKPHOS 81 07/01/2019 1005   ALKPHOS 66 12/31/2014 1313   BILITOT 0.4 07/01/2019 1005   BILITOT 0.2 12/31/2014 1313   GFRNONAA >60 07/01/2019 1005   GFRNONAA >60 12/31/2014 1313   GFRNONAA >60 07/16/2014 1351   GFRAA >60 07/01/2019 1005   GFRAA >60 12/31/2014 1313   GFRAA >60 07/16/2014 1351    No results found for: SPEP, UPEP  Lab Results  Component Value Date   WBC 6.2 07/01/2019   NEUTROABS 2.9 07/01/2019   HGB 11.4 (L) 07/01/2019   HCT 36.4 07/01/2019   MCV 88.3 07/01/2019   PLT 238 07/01/2019      Chemistry      Component Value Date/Time   NA 139 07/01/2019 1005   NA 143 12/31/2014 1313   K 3.6 07/01/2019 1005   K 3.5 12/31/2014 1313   CL 104 07/01/2019 1005   CL 110 (H) 12/31/2014 1313   CO2 24 07/01/2019 1005   CO2 28  12/31/2014 1313   BUN 16 07/01/2019 1005   BUN 13 12/31/2014 1313   CREATININE 0.92 07/01/2019 1005   CREATININE 0.89 12/31/2014 1313      Component Value Date/Time   CALCIUM 9.3 07/01/2019 1005   CALCIUM 8.4 (L) 12/31/2014 1313   ALKPHOS 81 07/01/2019 1005   ALKPHOS 66 12/31/2014 1313   AST 23 07/01/2019 1005   AST  21 12/31/2014 1313   ALT 19 07/01/2019 1005   ALT 23 12/31/2014 1313   BILITOT 0.4 07/01/2019 1005   BILITOT 0.2 12/31/2014 1313       RADIOGRAPHIC STUDIES: I have personally reviewed the radiological images as listed and agreed with the findings in the report. No results found.   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  However slightly abnormal tumor markers.  Tumor markers from today pending.  #Continue anastrozole; tolerating fairly well except for mild joint pains.  # Slightly abnormal tumor markers-CT scan PET scan March April 2020 no evidence of recurrence.  Await labs from today.  If again significantly elevated would recommend repeat imaging.  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  #Left foot callus-recommend keratolytic symptomatic.  # DISPOSITION:  # follow up TBD- Dr.B   No orders of the defined types were placed in this encounter.     Cammie Sickle, MD 07/01/2019 1:14 PM

## 2019-07-01 NOTE — Assessment & Plan Note (Addendum)
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  However slightly abnormal tumor markers.  Tumor markers from today pending.  #Continue anastrozole; tolerating fairly well except for mild joint pains.  # Slightly abnormal tumor markers-CT scan PET scan March April 2020 no evidence of recurrence.  Await labs from today.  If again significantly elevated would recommend repeat imaging.  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  #Left foot callus-recommend keratolytic symptomatic.  # DISPOSITION:  # follow up TBD- Dr.B

## 2019-07-02 LAB — CANCER ANTIGEN 15-3: CA 15-3: 30.6 U/mL — ABNORMAL HIGH (ref 0.0–25.0)

## 2019-07-02 LAB — CANCER ANTIGEN 27.29: CA 27.29: 44 U/mL — ABNORMAL HIGH (ref 0.0–38.6)

## 2019-07-09 ENCOUNTER — Telehealth: Payer: Self-pay | Admitting: Internal Medicine

## 2019-07-09 DIAGNOSIS — C50411 Malignant neoplasm of upper-outer quadrant of right female breast: Secondary | ICD-10-CM

## 2019-07-09 DIAGNOSIS — R978 Other abnormal tumor markers: Secondary | ICD-10-CM

## 2019-07-09 NOTE — Telephone Encounter (Signed)
Spoke to patient regarding the results of the elevated tumor marker; recommend further work-up with CT scan of the neck chest abdomen pelvis ASAP.  Patient agreement.  Please schedule the scans ASAP/follow-up to be decided.  Thank you

## 2019-07-16 ENCOUNTER — Ambulatory Visit
Admission: RE | Admit: 2019-07-16 | Discharge: 2019-07-16 | Disposition: A | Discharge status: Home / Self Care | Payer: BC Managed Care – PPO | Source: Ambulatory Visit | Attending: Internal Medicine | Admitting: Internal Medicine

## 2019-07-16 ENCOUNTER — Other Ambulatory Visit: Payer: Self-pay

## 2019-07-16 DIAGNOSIS — C50411 Malignant neoplasm of upper-outer quadrant of right female breast: Secondary | ICD-10-CM

## 2019-07-16 DIAGNOSIS — R978 Other abnormal tumor markers: Secondary | ICD-10-CM | POA: Diagnosis present

## 2019-07-16 DIAGNOSIS — Z170 Estrogen receptor positive status [ER+]: Secondary | ICD-10-CM | POA: Diagnosis present

## 2019-07-16 MED ORDER — IOHEXOL 300 MG/ML  SOLN
100.0000 mL | Freq: Once | INTRAMUSCULAR | Status: AC | PRN
  Administered 2019-07-16: 100 mL via INTRAVENOUS

## 2019-07-18 ENCOUNTER — Telehealth: Payer: Self-pay | Admitting: Internal Medicine

## 2019-07-18 DIAGNOSIS — C50411 Malignant neoplasm of upper-outer quadrant of right female breast: Secondary | ICD-10-CM

## 2019-07-18 NOTE — Telephone Encounter (Signed)
I spoke to patient regarding this results of the CT scan-negative for metastases/patient asymptomatic, but still slightly elevated tumor marker.   Recommend follow-up in 2 months-MD/labs- few days prior CBC/CMp/Ca-27-29.  Please schedule.  Thanks GB

## 2019-07-19 NOTE — Addendum Note (Signed)
Addended by: Sabino Gasser on: 07/19/2019 09:06 AM   Modules accepted: Orders

## 2019-08-21 ENCOUNTER — Encounter: Payer: Self-pay | Admitting: Internal Medicine

## 2019-09-19 ENCOUNTER — Other Ambulatory Visit: Payer: Self-pay

## 2019-09-19 ENCOUNTER — Encounter: Payer: Self-pay | Admitting: Internal Medicine

## 2019-09-19 NOTE — Progress Notes (Unsigned)
Pre-visit assessment completed prior to [facility] appointment with Dr. Rogue Bussing on 09/20/2019. No concerns identified at this time. Pt reports that she had the first surgery of her reconstructive process on 09/04/2019 and is going well other than mild tenderness in her left breast.

## 2019-09-20 ENCOUNTER — Inpatient Hospital Stay: Payer: BC Managed Care – PPO

## 2019-09-20 ENCOUNTER — Inpatient Hospital Stay: Payer: BC Managed Care – PPO | Admitting: Internal Medicine

## 2019-09-20 ENCOUNTER — Other Ambulatory Visit: Payer: Self-pay | Admitting: Internal Medicine

## 2019-09-20 DIAGNOSIS — C50411 Malignant neoplasm of upper-outer quadrant of right female breast: Secondary | ICD-10-CM

## 2019-09-20 DIAGNOSIS — Z170 Estrogen receptor positive status [ER+]: Secondary | ICD-10-CM

## 2019-09-20 NOTE — Progress Notes (Unsigned)
[facility] OFFICE PROGRESS NOTE  Patient Care Team: [name], MD as PCP - General (Family Medicine) Dear, [name], MD (Inactive) (Family Medicine) Bary Castilla, Forest Gleason, MD (General Surgery) Forest Gleason, MD (Inactive) (Unknown Physician Specialty) Thornton Park, MD as Consulting Physician (Orthopedic Surgery)  Cancer Staging Carcinoma of upper-outer quadrant of right breast in female, estrogen receptor positive [facility]) Staging form: Breast, AJCC 7th Edition - Clinical: Stage IIA (T2, N0, M0) - Signed by Forest Gleason, MD on 05/09/2015    Oncology History Overview Note  1. 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  Estrogen receptor positive.  Progesterone receptor positive.  HER-2/neu receptor amplified. 2. Started on neoadjuvant chemotherapy,  Taxotere, carboplatin and Herceptin and perjeta (March 05, 2014) 3. Patient does not have any clinically significant BRCA mutation Has been on and to [place] Mutation of unknown significance. . 4. 4 cycles of chemotherapy with TCH,perjeta previous did not June of 201 5.  Because of progressing side effect remaining 2 cycles of chemotherapy was not given but patient continued Herceptin and aperjeta  5. Status post right breast simple mastectomy in August of 2015 ypT1  ypN0 stage IB 6.patient started on tamoxifen in October of 2015 along with maintenance Herceptin therapy Patient is going for reconstructive surgery on October 21, 2014 final reconstructive surgery was done in February of 2016 [name] finished Herceptin therapy on March 11, 2015  # Sep 2019- STOP TAM; start Arimidex 42m/day.   # TAH & BSO [Dr.Harris; April 2019- menorrhagia] -------------------------------------------------    DIAGNOSIS: Breast cancer  STAGE:   II     ;GOALS: Cure  CURRENT/MOST RECENT THERAPY: Aromatase inhibitor    Carcinoma of upper-outer quadrant of right breast in female, estrogen receptor  positive [place]   INTERVAL HISTORY:  Sandra Nguyen 48y.o.  female right-sided stage II ER PR positive HER-2/neu positive status post mastectomy currently on anastrozole [TAH/BSO] is here for follow-up.  Patient denies any lumps or bumps denies any nausea vomiting.  No headaches.  Intermittent hot flashes.  Chronic mild joint pains.  Noted to have a " knot left foot sole".  No pain.  Review of Systems  Constitutional: Positive for malaise/fatigue. Negative for chills, diaphoresis and fever.  HENT: Negative.  Negative for nosebleeds and sore throat.   Eyes: Negative.  Negative for double vision.  Respiratory: Negative.  Negative for cough, hemoptysis, sputum production, shortness of breath and wheezing.   Cardiovascular: Negative.  Negative for chest pain, palpitations, orthopnea and leg swelling.  Gastrointestinal: Negative.  Negative for abdominal pain, blood in stool, constipation, diarrhea, heartburn, melena, nausea and vomiting.  Genitourinary: Negative.  Negative for dysuria, frequency and urgency.  Musculoskeletal: Positive for joint pain and myalgias. Negative for back pain.  Skin: Negative.  Negative for itching and rash.  Neurological: Negative for dizziness, tingling, focal weakness and weakness.  Endo/Heme/Allergies: Negative.  Does not bruise/bleed easily.  Psychiatric/Behavioral: Negative.  Negative for depression. The patient is not nervous/anxious and does not have insomnia.      REVIEW OF SYSTEMS:  A complete 10 point review of system is done which is negative except mentioned above/history of present illness.   PAST MEDICAL HISTORY :  Past Medical History:  Diagnosis Date  . Anemia   . Anxiety   . Atypical squamous cell changes of undetermined significance (ASCUS) on cervical cytology with negative high risk human papilloma virus (HPV) test result 06/20/2016  . Breast cancer ([facility] March 2015  Clinical T1c, N0, BRCA negative- Right  . Dysplasia of cervix, low  grade (CIN 1)   . 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 Malignant neoplasm of upper-outer quadrant of female breast [facility]) August 2015   pT1b, N0; M0. 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 Menorrhagia   . Migraines    migraines  . Vitamin D deficiency     PAST SURGICAL HISTORY :   Past Surgical History:  Procedure Laterality Date  . AUGMENTATION MAMMAPLASTY Bilateral Nov 2016   Dr Tula Nakayama REMOVED 07/2016  . BREAST BIOPSY Right 02/2014   positive  . BREAST RECONSTRUCTION Right 04/14/2015   Procedure: AEROLA/NIPPLE RECONSTRUCTION WITH GRAFT;  Surgeon: Nicholaus Bloom, MD;  Location: [place];  Service: Plastics;  Laterality: Right;  . BREAST SURGERY Right July 10, 2014   Right simple mastectomy with sentinel node biopsy.  . CERVICAL BIOPSY  W/ LOOP ELECTRODE EXCISION  1998  . COLONOSCOPY WITH PROPOFOL N/A 05/04/2016   Procedure: COLONOSCOPY WITH PROPOFOL;  Surgeon: Robert Bellow, MD;  Location: [facility] ENDOSCOPY;  Service: Endoscopy;  Laterality: N/A;  . CYSTOSCOPY  03/15/2018   Procedure: CYSTOSCOPY;  Surgeon: Gae Dry, MD;  Location: [facility] ORS;  Service: Gynecology;;  . DILATATION & CURETTAGE/HYSTEROSCOPY WITH MYOSURE N/A 08/03/2017   Procedure: [place];  Surgeon: Gae Dry, MD;  Location: [facility] ORS;  Service: Gynecology;  Laterality: N/A;  . ESSURE TUBAL LIGATION  09/15/2010  . EXCISIONAL HEMORRHOIDECTOMY  2015  . I and D of hemorrhoid  05/2017   Dr. Jamal Collin  . LAPAROSCOPIC HYSTERECTOMY Bilateral 03/15/2018   Procedure: HYSTERECTOMY TOTAL LAPAROSCOPIC BILATERAL SALPINGO OOPHORECTOMY;  Surgeon: Gae Dry, MD;  Location: [facility] ORS;  Service: Gynecology;  Laterality: Bilateral;  . MASTECTOMY Right 07/2014   chemo and herceptin tx  . PORTACATH PLACEMENT    . TUBAL LIGATION      FAMILY HISTORY :   Family History  Problem Relation Age of Onset  . Cervical cancer  Maternal Aunt 65  . Hypertension Mother   . Heart disease Maternal Grandmother   . Breast cancer Paternal Aunt 9  . Ovarian cancer Neg Hx   . Colon cancer Neg Hx     SOCIAL HISTORY:   Social History   Tobacco Use  . Smoking status: Never Smoker  . Smokeless tobacco: Never Used  Substance Use Topics  . Alcohol use: No  . Drug use: No    ALLERGIES:  is allergic to cephalexin.  MEDICATIONS:  Current Outpatient Medications  Medication Sig Dispense Refill  . anastrozole (ARIMIDEX) 1 MG tablet Take 1 tablet (1 mg total) by mouth daily. 90 tablet 3  . Biotin 10000 MCG TABS Take 10,000 mcg by mouth at bedtime.    . clonazePAM (KLONOPIN) 0.5 MG tablet Take 1 mg by mouth at bedtime.     . cloNIDine (CATAPRES) 0.1 MG tablet TAKE 1 TABLET (0.1 MG TOTAL) BY MOUTH DAILY. FOR HOT FLASHES 90 tablet 1  . levocetirizine (XYZAL) 5 MG tablet Take 5 mg by mouth every evening.     . mirtazapine (REMERON) 30 MG tablet Take 1 tablet (30 mg total) by mouth at bedtime. 90 tablet 3  . Vitamin D, Ergocalciferol, (DRISDOL) 1.25 MG (50000 UT) CAPS capsule TAKE ONE CAPSULE BY MOUTH ONCE A WEEK 12 capsule 2   No current facility-administered medications for this visit.     PHYSICAL EXAMINATION: ECOG PERFORMANCE STATUS: 0 - Asymptomatic  LMP 02/15/2018 (  Exact Date)   There were no vitals filed for this visit. Physical Exam  Constitutional: She is oriented to person, place, and time and well-developed, well-nourished, and in no distress.  HENT:  Head: Normocephalic and atraumatic.  Mouth/Throat: Oropharynx is clear and moist. No oropharyngeal exudate.  Eyes: Pupils are equal, round, and reactive to light.  Neck: Normal range of motion. Neck supple.  Cardiovascular: Normal rate and regular rhythm.  Pulmonary/Chest: No respiratory distress. She has no wheezes.  Abdominal: Soft. Bowel sounds are normal. She exhibits no distension and no mass. There is no abdominal tenderness. There is no rebound and no  guarding.  Musculoskeletal: Normal range of motion.        General: No tenderness or edema.  Neurological: She is alert and oriented to person, place, and time.  Skin: Skin is warm.  Psychiatric: Affect normal.     LABORATORY DATA:  I have reviewed the data as listed    Component Value Date/Time   NA 139 07/01/2019 1005   NA 143 12/31/2014 1313   K 3.6 07/01/2019 1005   K 3.5 12/31/2014 1313   CL 104 07/01/2019 1005   CL 110 (H) 12/31/2014 1313   CO2 24 07/01/2019 1005   CO2 28 12/31/2014 1313   GLUCOSE 116 (H) 07/01/2019 1005   GLUCOSE 124 (H) 12/31/2014 1313   BUN 16 07/01/2019 1005   BUN 13 12/31/2014 1313   CREATININE 0.92 07/01/2019 1005   CREATININE 0.89 12/31/2014 1313   CALCIUM 9.3 07/01/2019 1005   CALCIUM 8.4 (L) 12/31/2014 1313   PROT 7.7 07/01/2019 1005   PROT 6.9 12/31/2014 1313   ALBUMIN 4.4 07/01/2019 1005   ALBUMIN 3.5 12/31/2014 1313   AST 23 07/01/2019 1005   AST 21 12/31/2014 1313   ALT 19 07/01/2019 1005   ALT 23 12/31/2014 1313   ALKPHOS 81 07/01/2019 1005   ALKPHOS 66 12/31/2014 1313   BILITOT 0.4 07/01/2019 1005   BILITOT 0.2 12/31/2014 1313   GFRNONAA >60 07/01/2019 1005   GFRNONAA >60 12/31/2014 1313   GFRNONAA >60 07/16/2014 1351   GFRAA >60 07/01/2019 1005   GFRAA >60 12/31/2014 1313   GFRAA >60 07/16/2014 1351    No results found for: SPEP, UPEP  Lab Results  Component Value Date   WBC 6.2 07/01/2019   NEUTROABS 2.9 07/01/2019   HGB 11.4 (L) 07/01/2019   HCT 36.4 07/01/2019   MCV 88.3 07/01/2019   PLT 238 07/01/2019      Chemistry      Component Value Date/Time   NA 139 07/01/2019 1005   NA 143 12/31/2014 1313   K 3.6 07/01/2019 1005   K 3.5 12/31/2014 1313   CL 104 07/01/2019 1005   CL 110 (H) 12/31/2014 1313   CO2 24 07/01/2019 1005   CO2 28 12/31/2014 1313   BUN 16 07/01/2019 1005   BUN 13 12/31/2014 1313   CREATININE 0.92 07/01/2019 1005   CREATININE 0.89 12/31/2014 1313      Component Value Date/Time    CALCIUM 9.3 07/01/2019 1005   CALCIUM 8.4 (L) 12/31/2014 1313   ALKPHOS 81 07/01/2019 1005   ALKPHOS 66 12/31/2014 1313   AST 23 07/01/2019 1005   AST 21 12/31/2014 1313   ALT 19 07/01/2019 1005   ALT 23 12/31/2014 1313   BILITOT 0.4 07/01/2019 1005   BILITOT 0.2 12/31/2014 1313       RADIOGRAPHIC STUDIES: I have personally reviewed the radiological images as listed and agreed with  the findings in the report. No results found.   ASSESSMENT & PLAN:  No problem-specific Assessment & Plan notes found for this encounter.   No orders of the defined types were placed in this encounter.     Cammie Sickle, MD 09/20/2019 7:47 AM

## 2019-09-20 NOTE — Assessment & Plan Note (Signed)
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  However slightly abnormal tumor markers.  Tumor markers from today pending.  #Continue anastrozole; tolerating fairly well except for mild joint pains.  # Slightly abnormal tumor markers-CT scan PET scan March April 2020 no evidence of recurrence.  Await labs from today.  If again significantly elevated would recommend repeat imaging.  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  #Left foot callus-recommend keratolytic symptomatic.  # DISPOSITION:  # follow up TBD- Dr.B

## 2019-09-24 ENCOUNTER — Other Ambulatory Visit: Payer: Self-pay | Admitting: Internal Medicine

## 2019-10-07 ENCOUNTER — Inpatient Hospital Stay: Payer: BC Managed Care – PPO | Attending: Nurse Practitioner

## 2019-10-07 ENCOUNTER — Inpatient Hospital Stay (HOSPITAL_BASED_OUTPATIENT_CLINIC_OR_DEPARTMENT_OTHER): Payer: BC Managed Care – PPO | Admitting: Nurse Practitioner

## 2019-10-07 ENCOUNTER — Other Ambulatory Visit: Payer: Self-pay

## 2019-10-07 VITALS — BP 136/82 | HR 70 | Temp 98.3°F | Resp 16 | Wt 170.0 lb

## 2019-10-07 DIAGNOSIS — Z79811 Long term (current) use of aromatase inhibitors: Secondary | ICD-10-CM | POA: Diagnosis not present

## 2019-10-07 DIAGNOSIS — Z79899 Other long term (current) drug therapy: Secondary | ICD-10-CM | POA: Diagnosis not present

## 2019-10-07 DIAGNOSIS — Z170 Estrogen receptor positive status [ER+]: Secondary | ICD-10-CM

## 2019-10-07 DIAGNOSIS — C50411 Malignant neoplasm of upper-outer quadrant of right female breast: Secondary | ICD-10-CM | POA: Diagnosis not present

## 2019-10-07 DIAGNOSIS — M1990 Unspecified osteoarthritis, unspecified site: Secondary | ICD-10-CM | POA: Diagnosis not present

## 2019-10-07 LAB — COMPREHENSIVE METABOLIC PANEL
ALT: 19 U/L (ref 0–44)
AST: 25 U/L (ref 15–41)
Albumin: 4.5 g/dL (ref 3.5–5.0)
Alkaline Phosphatase: 78 U/L (ref 38–126)
Anion gap: 11 (ref 5–15)
BUN: 10 mg/dL (ref 6–20)
CO2: 23 mmol/L (ref 22–32)
Calcium: 9.2 mg/dL (ref 8.9–10.3)
Chloride: 104 mmol/L (ref 98–111)
Creatinine, Ser: 0.95 mg/dL (ref 0.44–1.00)
GFR calc Af Amer: >60 mL/min (ref >60)
GFR calc non Af Amer: >60 mL/min (ref >60)
Glucose, Bld: 87 mg/dL (ref 70–99)
Potassium: 3.5 mmol/L (ref 3.5–5.1)
Sodium: 138 mmol/L (ref 135–145)
Total Bilirubin: 0.4 mg/dL (ref 0.3–1.2)
Total Protein: 7.7 g/dL (ref 6.5–8.1)

## 2019-10-07 LAB — CBC WITH DIFFERENTIAL/PLATELET
Abs Immature Granulocytes: 0.02 10*3/uL (ref 0.00–0.07)
Basophils Absolute: 0 10*3/uL (ref 0.0–0.1)
Basophils Relative: 0 %
Eosinophils Absolute: 0.3 10*3/uL (ref 0.0–0.5)
Eosinophils Relative: 5 %
HCT: 36.8 % (ref 36.0–46.0)
Hemoglobin: 11.6 g/dL — ABNORMAL LOW (ref 12.0–15.0)
Immature Granulocytes: 0 %
Lymphocytes Relative: 39 %
Lymphs Abs: 2.6 10*3/uL (ref 0.7–4.0)
MCH: 28.2 pg (ref 26.0–34.0)
MCHC: 31.5 g/dL (ref 30.0–36.0)
MCV: 89.3 fL (ref 80.0–100.0)
Monocytes Absolute: 0.5 10*3/uL (ref 0.1–1.0)
Monocytes Relative: 7 %
Neutro Abs: 3.3 10*3/uL (ref 1.7–7.7)
Neutrophils Relative %: 49 %
Platelets: 246 10*3/uL (ref 150–400)
RBC: 4.12 MIL/uL (ref 3.87–5.11)
RDW: 13.8 % (ref 11.5–15.5)
WBC: 6.7 10*3/uL (ref 4.0–10.5)
nRBC: 0 % (ref 0.0–0.2)

## 2019-10-07 NOTE — Progress Notes (Signed)
[place] OFFICE PROGRESS NOTE  Patient Care Team: Sandra Sarah, MD as PCP - General (Family Medicine) Dear, Sandra Mcburney, MD (Inactive) (Family Medicine) Sandra Nguyen, Sandra Gleason, MD (General Surgery) Sandra Gleason, MD (Inactive) (Unknown Physician Specialty) Sandra Park, MD as Consulting Physician (Orthopedic Surgery)  Cancer Staging Carcinoma of upper-outer quadrant of right breast in female, estrogen receptor positive [facility]) Staging form: Breast, AJCC 7th Edition - Clinical: Stage IIA (T2, N0, M0) - Signed by Sandra Gleason, MD on 05/09/2015   Oncology History Overview Note  1. 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  Estrogen receptor positive.  Progesterone receptor positive.  HER-2/neu receptor amplified. 2. Started on neoadjuvant chemotherapy,  Taxotere, carboplatin and Herceptin and perjeta (March 05, 2014) 3. Patient does not have any clinically significant BRCA mutation Has been on and to [place] Mutation of unknown significance. . 4. 4 cycles of chemotherapy with TCH,perjeta previous did not June of 201 5.  Because of progressing side effect remaining 2 cycles of chemotherapy was not given but patient continued Herceptin and aperjeta  5. Status post right breast simple mastectomy in August of 2015 ypT1  ypN0 stage IB 6.patient started on tamoxifen in October of 2015 along with maintenance Herceptin therapy Patient is going for reconstructive surgery on October 21, 2014 final reconstructive surgery was done in February of 2016 [name] finished Herceptin therapy on March 11, 2015  # Sep 2019- STOP TAM; start Arimidex 24m/day.   # TAH & BSO [Dr.Harris; April 2019- menorrhagia] -------------------------------------------------    DIAGNOSIS: Breast cancer  STAGE:   II     ;GOALS: Cure  CURRENT/MOST RECENT THERAPY: Aromatase inhibitor    Carcinoma of upper-outer quadrant of right breast in female, estrogen receptor  positive ([place]   INTERVAL HISTORY: Sandra Nguyen 48y.o. female with right-sided, stage II, ER/PR positive, HER-2/neu positive, right breast cancer status postmastectomy, currently on anastrozole (TAH/BSO), who returns to clinic for follow-up.  Today, she reports feeling well and denies any new lumps or bumps.  Denies skin changes, nipple discharge.  Denies any new headaches.  She continues to have mild joint pain which are not bothersome.  No headaches.  Tolerating anastrozole well without significant side effects.  Tumor markers have been monitored closely given slight increase over previous months.  She had imaging with CT scans in August which were reported as normal without evidence of metastatic disease in the neck, chest, abdomen, pelvis.  She says she is anxious regarding today's results.  Last mammogram in January reported as BI-RADS Category 1: Negative.  Bone density at that same time reported as T score of -0.9 which is normal.   Review of Systems  Constitutional: Positive for malaise/fatigue. Negative for chills, diaphoresis and fever.  HENT: Negative.  Negative for nosebleeds and sore throat.   Eyes: Negative.  Negative for double vision.  Respiratory: Negative.  Negative for cough, hemoptysis, sputum production, shortness of breath and wheezing.   Cardiovascular: Negative.  Negative for chest pain, palpitations, orthopnea and leg swelling.  Gastrointestinal: Negative.  Negative for abdominal pain, blood in stool, constipation, diarrhea, heartburn, melena, nausea and vomiting.  Genitourinary: Negative.  Negative for dysuria, frequency and urgency.  Musculoskeletal: Positive for joint pain (Chronic) and myalgias (Chronic). Negative for back pain.  Skin: Negative.  Negative for itching and rash.  Neurological: Negative for dizziness, tingling, focal weakness and weakness.  Endo/Heme/Allergies: Negative.  Does not bruise/bleed easily.  Psychiatric/Behavioral: Negative.  Negative for  depression.  The patient is not nervous/anxious and does not have insomnia.    REVIEW OF SYSTEMS:  A complete 10 point review of system is done which is negative except mentioned above/history of present illness.   PAST MEDICAL HISTORY :  Past Medical History:  Diagnosis Date  . Anemia   . Anxiety   . Atypical squamous cell changes of undetermined significance (ASCUS) on cervical cytology with negative high risk human papilloma virus (HPV) test result 06/20/2016  . Breast cancer [facility]) March 2015    Clinical T1c, N0, BRCA negative- Right  . Dysplasia of cervix, low grade (CIN 1)   . 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 Malignant neoplasm of upper-outer quadrant of female breast [facility]) August 2015   pT1b, N0; M0. 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 Menorrhagia   . Migraines    migraines  . Vitamin D deficiency     PAST SURGICAL HISTORY :   Past Surgical History:  Procedure Laterality Date  . AUGMENTATION MAMMAPLASTY Bilateral Nov 2016   Dr Tula Nakayama REMOVED 07/2016  . BREAST BIOPSY Right 02/2014   positive  . BREAST RECONSTRUCTION Right 04/14/2015   Procedure: AEROLA/NIPPLE RECONSTRUCTION WITH GRAFT;  Surgeon: Nicholaus Bloom, MD;  Location: [facility];  Service: Plastics;  Laterality: Right;  . BREAST SURGERY Right July 10, 2014   Right simple mastectomy with sentinel node biopsy.  . CERVICAL BIOPSY  W/ LOOP ELECTRODE EXCISION  1998  . COLONOSCOPY WITH PROPOFOL N/A 05/04/2016   Procedure: COLONOSCOPY WITH PROPOFOL;  Surgeon: Robert Bellow, MD;  Location: [facility] ENDOSCOPY;  Service: Endoscopy;  Laterality: N/A;  . CYSTOSCOPY  03/15/2018   Procedure: CYSTOSCOPY;  Surgeon: Gae Dry, MD;  Location: [facility] ORS;  Service: Gynecology;;  . DILATATION & CURETTAGE/HYSTEROSCOPY WITH MYOSURE N/A 08/03/2017   Procedure: [place];  Surgeon: Gae Dry, MD;  Location: [facility] ORS;  Service:  Gynecology;  Laterality: N/A;  . ESSURE TUBAL LIGATION  09/15/2010  . EXCISIONAL HEMORRHOIDECTOMY  2015  . I and D of hemorrhoid  05/2017   Dr. Jamal Collin  . LAPAROSCOPIC HYSTERECTOMY Bilateral 03/15/2018   Procedure: HYSTERECTOMY TOTAL LAPAROSCOPIC BILATERAL SALPINGO OOPHORECTOMY;  Surgeon: Gae Dry, MD;  Location: [facility] ORS;  Service: Gynecology;  Laterality: Bilateral;  . MASTECTOMY Right 07/2014   chemo and herceptin tx  . PORTACATH PLACEMENT    . TUBAL LIGATION      FAMILY HISTORY :   Family History  Problem Relation Age of Onset  . Cervical cancer Maternal Aunt 65  . Hypertension Mother   . Heart disease Maternal Grandmother   . Breast cancer Paternal Aunt 2  . Ovarian cancer Neg Hx   . Colon cancer Neg Hx     SOCIAL HISTORY:   Social History   Tobacco Use  . Smoking status: Never Smoker  . Smokeless tobacco: Never Used  Substance Use Topics  . Alcohol use: No  . Drug use: No    ALLERGIES:  is allergic to cephalexin.  MEDICATIONS:  Current Outpatient Medications  Medication Sig Dispense Refill  . anastrozole (ARIMIDEX) 1 MG tablet Take 1 tablet (1 mg total) by mouth daily. 90 tablet 3  . Biotin 10000 MCG TABS Take 10,000 mcg by mouth at bedtime.    . clonazePAM (KLONOPIN) 0.5 MG tablet Take 1 mg by mouth at bedtime.     . cloNIDine (CATAPRES) 0.1 MG tablet TAKE 1 TABLET (0.1 MG TOTAL) BY MOUTH DAILY. FOR  HOT FLASHES 90 tablet 1  . levocetirizine (XYZAL) 5 MG tablet Take 5 mg by mouth every evening.     . mirtazapine (REMERON) 30 MG tablet TAKE 1 TABLET (30 MG TOTAL) BY MOUTH AT BEDTIME. 90 tablet 3  . Vitamin D, Ergocalciferol, (DRISDOL) 1.25 MG (50000 UT) CAPS capsule TAKE ONE CAPSULE BY MOUTH ONCE A WEEK 12 capsule 2   No current facility-administered medications for this visit.     PHYSICAL EXAMINATION: ECOG PERFORMANCE STATUS: 0 - Asymptomatic  BP 136/82 (BP Location: Left Arm, Patient Position: Sitting)   Pulse 70   Temp 98.3 F (36.8 C)  (Temporal)   Resp 16   Wt 170 lb (77.1 kg)   LMP 02/15/2018 (Exact Date)   BMI 30.11 kg/m   Filed Weights   10/07/19 1353  Weight: 170 lb (77.1 kg)   Physical Exam  Constitutional: She is oriented to person, place, and time and well-developed, well-nourished, and in no distress.  Unaccompanied.  Wearing mask.  HENT:  Head: Normocephalic and atraumatic.  Mouth/Throat: Oropharynx is clear and moist. No oropharyngeal exudate.  Eyes: Conjunctivae are normal. No scleral icterus.  Neck: Normal range of motion.  Cardiovascular: Normal rate and regular rhythm.  Pulmonary/Chest: No respiratory distress. She has no wheezes.  Abdominal: Soft. Bowel sounds are normal. She exhibits no distension and no mass. There is no abdominal tenderness. There is no rebound and no guarding.  Musculoskeletal: Normal range of motion.        General: No tenderness or edema.  Lymphadenopathy:    She has no cervical adenopathy.  Neurological: She is alert and oriented to person, place, and time.  Skin: Skin is warm and dry.  Psychiatric: Mood and affect normal.    LABORATORY DATA:  I have reviewed the data as listed    Component Value Date/Time   NA 138 10/07/2019 1302   NA 143 12/31/2014 1313   K 3.5 10/07/2019 1302   K 3.5 12/31/2014 1313   CL 104 10/07/2019 1302   CL 110 (H) 12/31/2014 1313   CO2 23 10/07/2019 1302   CO2 28 12/31/2014 1313   GLUCOSE 87 10/07/2019 1302   GLUCOSE 124 (H) 12/31/2014 1313   BUN 10 10/07/2019 1302   BUN 13 12/31/2014 1313   CREATININE 0.95 10/07/2019 1302   CREATININE 0.89 12/31/2014 1313   CALCIUM 9.2 10/07/2019 1302   CALCIUM 8.4 (L) 12/31/2014 1313   PROT 7.7 10/07/2019 1302   PROT 6.9 12/31/2014 1313   ALBUMIN 4.5 10/07/2019 1302   ALBUMIN 3.5 12/31/2014 1313   AST 25 10/07/2019 1302   AST 21 12/31/2014 1313   ALT 19 10/07/2019 1302   ALT 23 12/31/2014 1313   ALKPHOS 78 10/07/2019 1302   ALKPHOS 66 12/31/2014 1313   BILITOT 0.4 10/07/2019 1302    BILITOT 0.2 12/31/2014 1313   GFRNONAA >60 10/07/2019 1302   GFRNONAA >60 12/31/2014 1313   GFRNONAA >60 07/16/2014 1351   GFRAA >60 10/07/2019 1302   GFRAA >60 12/31/2014 1313   GFRAA >60 07/16/2014 1351    No results found for: SPEP, UPEP  Lab Results  Component Value Date   WBC 6.7 10/07/2019   NEUTROABS 3.3 10/07/2019   HGB 11.6 (L) 10/07/2019   HCT 36.8 10/07/2019   MCV 89.3 10/07/2019   PLT 246 10/07/2019      Chemistry      Component Value Date/Time   NA 138 10/07/2019 1302   NA 143 12/31/2014 1313   K  3.5 10/07/2019 1302   K 3.5 12/31/2014 1313   CL 104 10/07/2019 1302   CL 110 (H) 12/31/2014 1313   CO2 23 10/07/2019 1302   CO2 28 12/31/2014 1313   BUN 10 10/07/2019 1302   BUN 13 12/31/2014 1313   CREATININE 0.95 10/07/2019 1302   CREATININE 0.89 12/31/2014 1313      Component Value Date/Time   CALCIUM 9.2 10/07/2019 1302   CALCIUM 8.4 (L) 12/31/2014 1313   ALKPHOS 78 10/07/2019 1302   ALKPHOS 66 12/31/2014 1313   AST 25 10/07/2019 1302   AST 21 12/31/2014 1313   ALT 19 10/07/2019 1302   ALT 23 12/31/2014 1313   BILITOT 0.4 10/07/2019 1302   BILITOT 0.2 12/31/2014 1313     CA 27-29 02/18/2014 37.4 08/27/2014 32.2 10/08/2014 31.7 08/13/2018 21.9 09/24/2018 25.8 12/31/2018 39.4 04/22/2019 40.9 07/01/2019 44  CA 15-3 04/22/2019 33.4 07/01/2019 30.6  CEA 04/22/2019 1.0  RADIOGRAPHIC STUDIES: I have personally reviewed the radiological images as listed and agreed with the findings in the report.  12/26/2018-mammogram 3D screening breast unilateral left No evidence of malignancy in left breast.  BI-RADS Category 1: Negative   12/26/2018-DG bone density T score of -0.9 in right femur neck considered normal  No results found.   ASSESSMENT & PLAN:  Carcinoma of upper-outer quadrant of right breast in female, estrogen receptor positive ([place]) #Stage II ER/PR positive HER-2/neu positive right breast cancer-clinically, no evidence of recurrence today.   Tumor markers have been slightly rising previously.  Tumor marker was pending at time of visit.  We discussed that given that she is asymptomatic we would wait for labs and if elevated again would recommend repeating imaging which she was agreeable to.  However, CA 27-29 has since resulted and has improved/normalized to 38.1.  Recommend continued surveillance.  Plan to repeat surveillance mammogram of left breast in January 2021.  If new pain or skin changes in right breast would consider MRI  #Antihormone therapy-currently on anastrozole.  Tolerating fairly well except for mild joint pains.  Bone density scan in January was reported as normal.  Encourage patient to continue calcium and vitamin D supplementation along with weightbearing exercise as tolerated.  Plan to repeat bone density scan in January 2022.  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  Stable.  Continue glucosamine.  Could consider adding chondroitin sulfate.    #Left foot callus- Dr. Rogue Bussing previously recommended keratolytic agent if symptomatic.  Continue to monitor.  # DISPOSITION:  Repeat mammogram in January 2021 Couple days later follow-up with labs (CBC, CMP, CA27.29   Orders Placed This Encounter  Procedures  . MM SCREENING BREAST TOMO UNI L    Standing Status:   Future    Standing Expiration Date:   12/10/2020    Order Specific Question:   Reason for Exam (SYMPTOM  OR DIAGNOSIS REQUIRED)    Answer:   Hx of breast cancer s/p mastectomy in right breast.    Order Specific Question:   Is the patient pregnant?    Answer:   No    Order Specific Question:   Preferred imaging location?    Answer:   [facility]  . CBC with Differential/Platelet    Standing Status:   Future    Standing Expiration Date:   10/10/2020  . Comprehensive metabolic panel    Standing Status:   Future    Standing Expiration Date:   10/10/2020  . Cancer antigen 27.29    Standing Status:   Future  Standing Expiration Date:   10/10/2020    Beckey Rutter,  [place], AGNP-C [place] at [facility] [phone] (clinic)   CC:Dr. Rogue Bussing

## 2019-10-08 LAB — CANCER ANTIGEN 27.29: CA 27.29: 38.1 U/mL (ref 0.0–38.6)

## 2019-10-11 NOTE — Assessment & Plan Note (Signed)
#  Stage II ER/PR positive HER-2/neu positive right breast cancer-clinically, no evidence of recurrence today.  Tumor markers have been slightly rising previously.  Tumor marker was pending at time of visit.  We discussed that given that she is asymptomatic we would wait for labs and if elevated again would recommend repeating imaging which she was agreeable to.  However, CA 27-29 has since resulted and has improved/normalized to 38.1.  Recommend continued surveillance.  Plan to repeat surveillance mammogram of left breast in January 2021.  If new pain or skin changes in right breast would consider MRI  #Antihormone therapy-currently on anastrozole.  Tolerating fairly well except for mild joint pains.  Bone density scan in January was reported as normal.  Encourage patient to continue calcium and vitamin D supplementation along with weightbearing exercise as tolerated.  Plan to repeat bone density scan in January 2022.  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  Stable.  Continue glucosamine.  Could consider adding chondroitin sulfate.    #Left foot callus- Dr. Rogue Bussing previously recommended keratolytic agent if symptomatic.  Continue to monitor.  # DISPOSITION:  Repeat mammogram in January 2021 Couple days later follow-up with labs (CBC, CMP, CA27.29

## 2019-11-26 ENCOUNTER — Other Ambulatory Visit: Payer: Self-pay | Admitting: Internal Medicine

## 2019-12-25 ENCOUNTER — Other Ambulatory Visit: Payer: Self-pay | Admitting: Internal Medicine

## 2020-01-01 ENCOUNTER — Other Ambulatory Visit: Payer: Self-pay

## 2020-01-01 ENCOUNTER — Encounter: Payer: Self-pay | Admitting: Internal Medicine

## 2020-01-02 ENCOUNTER — Inpatient Hospital Stay: Payer: BC Managed Care – PPO | Admitting: Internal Medicine

## 2020-01-10 ENCOUNTER — Inpatient Hospital Stay: Payer: BC Managed Care – PPO | Attending: Internal Medicine | Admitting: Internal Medicine

## 2020-01-10 DIAGNOSIS — C50411 Malignant neoplasm of upper-outer quadrant of right female breast: Secondary | ICD-10-CM | POA: Insufficient documentation

## 2020-01-10 DIAGNOSIS — Z9221 Personal history of antineoplastic chemotherapy: Secondary | ICD-10-CM | POA: Insufficient documentation

## 2020-01-10 DIAGNOSIS — Z170 Estrogen receptor positive status [ER+]: Secondary | ICD-10-CM | POA: Diagnosis not present

## 2020-01-10 DIAGNOSIS — Z9011 Acquired absence of right breast and nipple: Secondary | ICD-10-CM | POA: Insufficient documentation

## 2020-01-10 DIAGNOSIS — Z79811 Long term (current) use of aromatase inhibitors: Secondary | ICD-10-CM | POA: Insufficient documentation

## 2020-01-10 NOTE — Assessment & Plan Note (Addendum)
#  Stage II ER/PR positive HER-2/neu positive right breast cancer-clinically, no evidence of recurrence today.  Currently on anastrozole.  Awaiting mammogram with Dr. Tollie Pizza in April.  # COVID infection- [dec 23rd]-symptom resolved.  Back to baseline.  #Abnormal tumor markers-unlikely secondary to active malignancy.  Would recommend repeating labs next week.  # DISPOSITION: # labs next week # follow up TBD- Dr.B

## 2020-01-10 NOTE — Progress Notes (Signed)
I connected with Linzie L Nguyen on 01/10/20 at  2:30 PM EST by video enabled telemedicine visit and verified that I am speaking with the correct person using two identifiers.  I discussed the limitations, risks, security and privacy concerns of performing an evaluation and management service by telemedicine and the availability of in-person appointments. I also discussed with the patient that there may be a patient responsible charge related to this service. The patient expressed understanding and agreed to proceed.    Other persons participating in the visit and their role in the encounter: RN/medical reconciliation Patient's location: Home Provider's location: Office  Oncology History Overview Note  1. 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  Estrogen receptor positive.  Progesterone receptor positive.  HER-2/neu receptor amplified. 2. Started on neoadjuvant chemotherapy,  Taxotere, carboplatin and Herceptin and perjeta (March 05, 2014) 3. Patient does not have any clinically significant BRCA mutation Has been on and to [place] Mutation of unknown significance. . 4. 4 cycles of chemotherapy with TCH,perjeta previous did not June of 201 5.  Because of progressing side effect remaining 2 cycles of chemotherapy was not given but patient continued Herceptin and aperjeta  5. Status post right breast simple mastectomy in August of 2015 ypT1  ypN0 stage IB 6.patient started on tamoxifen in October of 2015 along with maintenance Herceptin therapy Patient is going for reconstructive surgery on October 21, 2014 final reconstructive surgery was done in February of 2016 [name] finished Herceptin therapy on March 11, 2015  # Sep 2019- STOP TAM; start Arimidex 12m/day.   # TAH & BSO [Dr.Harris; April 2019- menorrhagia] -------------------------------------------------    DIAGNOSIS: Breast cancer  STAGE:   II     ;GOALS: Cure  CURRENT/MOST RECENT  THERAPY: Aromatase inhibitor    Carcinoma of upper-outer quadrant of right breast in female, estrogen receptor positive ([place]     Chief Complaint: Breast cancer   History of present illness:Sandra Nguyen 49y.o.  female with history of breast cancer stage II currently on anastrozole is here for follow-up.  Patient in the interim underwent reconstruction/had breast implant placed.  Patient was diagnosed with Covid in December 2020.  She has recovered completely; no serious complications noted.  Patient complains of mild soreness from her breast surgery otherwise doing well.  Chronic mild arthritis.  Chronic mild joint pain slightly worse.  Observation/objective: None.  Assessment and plan: Carcinoma of upper-outer quadrant of right breast in female, estrogen receptor positive ([place] #Stage II ER/PR positive HER-2/neu positive right breast cancer-clinically, no evidence of recurrence today.  Currently on anastrozole.  Awaiting mammogram with Dr. BTollie Pizzain April.  # COVID infection- [dec 23rd]-symptom resolved.  Back to baseline.  #Abnormal tumor markers-unlikely secondary to active malignancy.  Would recommend repeating labs next week.  # DISPOSITION: # labs next week # follow up TBD- Dr.B   Follow-up instructions:  I discussed the assessment and treatment plan with the patient.  The patient was provided an opportunity to ask questions and all were answered.  The patient agreed with the plan and demonstrated understanding of instructions.  The patient was advised to call back or seek an in person evaluation if the symptoms worsen or if the condition fails to improve as anticipated.  Dr. GCharlaine DaltonCHCC at [facility]2/04/2020 4:18 PM

## 2020-01-17 ENCOUNTER — Inpatient Hospital Stay: Payer: BC Managed Care – PPO

## 2020-01-17 ENCOUNTER — Other Ambulatory Visit: Payer: Self-pay

## 2020-01-17 DIAGNOSIS — Z170 Estrogen receptor positive status [ER+]: Secondary | ICD-10-CM

## 2020-01-17 DIAGNOSIS — C50411 Malignant neoplasm of upper-outer quadrant of right female breast: Secondary | ICD-10-CM

## 2020-01-17 DIAGNOSIS — Z79811 Long term (current) use of aromatase inhibitors: Secondary | ICD-10-CM | POA: Diagnosis not present

## 2020-01-17 DIAGNOSIS — Z9221 Personal history of antineoplastic chemotherapy: Secondary | ICD-10-CM | POA: Diagnosis not present

## 2020-01-17 DIAGNOSIS — Z9011 Acquired absence of right breast and nipple: Secondary | ICD-10-CM | POA: Diagnosis not present

## 2020-01-17 LAB — CBC WITH DIFFERENTIAL/PLATELET
Abs Immature Granulocytes: 0.02 10*3/uL (ref 0.00–0.07)
Basophils Absolute: 0 10*3/uL (ref 0.0–0.1)
Basophils Relative: 0 %
Eosinophils Absolute: 0.1 10*3/uL (ref 0.0–0.5)
Eosinophils Relative: 2 %
HCT: 38.9 % (ref 36.0–46.0)
Hemoglobin: 12.2 g/dL (ref 12.0–15.0)
Immature Granulocytes: 0 %
Lymphocytes Relative: 48 %
Lymphs Abs: 3 10*3/uL (ref 0.7–4.0)
MCH: 28 pg (ref 26.0–34.0)
MCHC: 31.4 g/dL (ref 30.0–36.0)
MCV: 89.2 fL (ref 80.0–100.0)
Monocytes Absolute: 0.4 10*3/uL (ref 0.1–1.0)
Monocytes Relative: 7 %
Neutro Abs: 2.8 10*3/uL (ref 1.7–7.7)
Neutrophils Relative %: 43 %
Platelets: 276 10*3/uL (ref 150–400)
RBC: 4.36 MIL/uL (ref 3.87–5.11)
RDW: 14.6 % (ref 11.5–15.5)
WBC: 6.4 10*3/uL (ref 4.0–10.5)
nRBC: 0 % (ref 0.0–0.2)

## 2020-01-17 LAB — COMPREHENSIVE METABOLIC PANEL
ALT: 20 U/L (ref 0–44)
AST: 26 U/L (ref 15–41)
Albumin: 4.7 g/dL (ref 3.5–5.0)
Alkaline Phosphatase: 92 U/L (ref 38–126)
Anion gap: 10 (ref 5–15)
BUN: 11 mg/dL (ref 6–20)
CO2: 23 mmol/L (ref 22–32)
Calcium: 9.5 mg/dL (ref 8.9–10.3)
Chloride: 104 mmol/L (ref 98–111)
Creatinine, Ser: 0.79 mg/dL (ref 0.44–1.00)
GFR calc Af Amer: >60 mL/min (ref >60)
GFR calc non Af Amer: >60 mL/min (ref >60)
Glucose, Bld: 127 mg/dL — ABNORMAL HIGH (ref 70–99)
Potassium: 3.6 mmol/L (ref 3.5–5.1)
Sodium: 137 mmol/L (ref 135–145)
Total Bilirubin: 0.4 mg/dL (ref 0.3–1.2)
Total Protein: 7.9 g/dL (ref 6.5–8.1)

## 2020-01-18 LAB — CANCER ANTIGEN 27.29: CA 27.29: 43.5 U/mL — ABNORMAL HIGH (ref 0.0–38.6)

## 2020-01-18 LAB — CANCER ANTIGEN 15-3: CA 15-3: 40.4 U/mL — ABNORMAL HIGH (ref 0.0–25.0)

## 2020-01-21 ENCOUNTER — Telehealth: Payer: Self-pay | Admitting: Internal Medicine

## 2020-01-21 DIAGNOSIS — C50411 Malignant neoplasm of upper-outer quadrant of right female breast: Secondary | ICD-10-CM

## 2020-01-21 NOTE — Addendum Note (Signed)
Addended by: Gloris Ham on: 01/21/2020 08:22 AM   Modules accepted: Orders

## 2020-01-21 NOTE — Telephone Encounter (Signed)
On 2/15-spoke to patient regarding results of the tumor marker elevated.  Last imaging August 2020-NED.  Discussed pros and cons of imaging at this time/tumor marker checking.  Decided to wait on imaging-repeat tumor markers in 2 months-if still elevated would recommend imaging at that time.  C-Schedule follow-up in 2 months- MD-video visit; 2-3 days prior labs- CA 27-29; CA 15-3.

## 2020-03-19 ENCOUNTER — Other Ambulatory Visit: Payer: Self-pay | Admitting: General Surgery

## 2020-03-19 DIAGNOSIS — Z853 Personal history of malignant neoplasm of breast: Secondary | ICD-10-CM

## 2020-03-20 ENCOUNTER — Other Ambulatory Visit: Payer: Self-pay

## 2020-03-20 ENCOUNTER — Inpatient Hospital Stay: Payer: BC Managed Care – PPO | Attending: Internal Medicine

## 2020-03-20 DIAGNOSIS — R978 Other abnormal tumor markers: Secondary | ICD-10-CM | POA: Diagnosis not present

## 2020-03-20 DIAGNOSIS — Z170 Estrogen receptor positive status [ER+]: Secondary | ICD-10-CM | POA: Insufficient documentation

## 2020-03-20 DIAGNOSIS — C50411 Malignant neoplasm of upper-outer quadrant of right female breast: Secondary | ICD-10-CM | POA: Diagnosis present

## 2020-03-20 DIAGNOSIS — Z8616 Personal history of COVID-19: Secondary | ICD-10-CM | POA: Diagnosis not present

## 2020-03-20 DIAGNOSIS — Z9011 Acquired absence of right breast and nipple: Secondary | ICD-10-CM | POA: Diagnosis not present

## 2020-03-20 DIAGNOSIS — Z79811 Long term (current) use of aromatase inhibitors: Secondary | ICD-10-CM | POA: Diagnosis not present

## 2020-03-21 LAB — CANCER ANTIGEN 15-3: CA 15-3: 30.9 U/mL — ABNORMAL HIGH (ref 0.0–25.0)

## 2020-03-21 LAB — CANCER ANTIGEN 27.29: CA 27.29: 39.5 U/mL — ABNORMAL HIGH (ref 0.0–38.6)

## 2020-03-24 ENCOUNTER — Inpatient Hospital Stay (HOSPITAL_BASED_OUTPATIENT_CLINIC_OR_DEPARTMENT_OTHER): Payer: BC Managed Care – PPO | Admitting: Internal Medicine

## 2020-03-24 DIAGNOSIS — C50411 Malignant neoplasm of upper-outer quadrant of right female breast: Secondary | ICD-10-CM | POA: Diagnosis not present

## 2020-03-24 DIAGNOSIS — Z170 Estrogen receptor positive status [ER+]: Secondary | ICD-10-CM

## 2020-03-24 DIAGNOSIS — Z79811 Long term (current) use of aromatase inhibitors: Secondary | ICD-10-CM | POA: Diagnosis not present

## 2020-03-24 NOTE — Assessment & Plan Note (Addendum)
#  Stage II ER/PR positive HER-2/neu positive right breast cancer-clinically, no evidence of recurrence today.  Currently on anastrozole.  Stable.  #Abnormal tumor markers-unlikely secondary to active malignancy.  Monitor for now.  #  DISPOSITION: # Follow up in 3 months- MD;2-3 days prior- labs- cbc/cmp/ ca-27-29; Ca15-3-Dr.B

## 2020-03-24 NOTE — Progress Notes (Signed)
I connected with Sandra Nguyen on 03/24/2020 at  3:30 PM EDT by video enabled telemedicine visit and verified that I am speaking with the correct person using two identifiers.  I discussed the limitations, risks, security and privacy concerns of performing an evaluation and management service by telemedicine and the availability of in-person appointments. I also discussed with the patient that there may be a patient responsible charge related to this service. The patient expressed understanding and agreed to proceed.    Other persons participating in the visit and their role in the encounter: RN/medical reconciliation Patient's location: Home Provider's location: Office  Oncology History Overview Note  1. 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  Estrogen receptor positive.  Progesterone receptor positive.  HER-2/neu receptor amplified. 2. Started on neoadjuvant chemotherapy,  Taxotere, carboplatin and Herceptin and perjeta (March 05, 2014) 3. Patient does not have any clinically significant BRCA mutation Has been on and to [place] Mutation of unknown significance. . 4. 4 cycles of chemotherapy with TCH,perjeta previous did not June of 201 5.  Because of progressing side effect remaining 2 cycles of chemotherapy was not given but patient continued Herceptin and aperjeta  5. Status post right breast simple mastectomy in August of 2015 ypT1  ypN0 stage IB 6.patient started on tamoxifen in October of 2015 along with maintenance Herceptin therapy Patient is going for reconstructive surgery on October 21, 2014 final reconstructive surgery was done in February of 2016 [name] finished Herceptin therapy on March 11, 2015  # Sep 2019- STOP TAM; start Arimidex 68m/day.   # TAH & BSO [Dr.Harris; April 2019- menorrhagia] -------------------------------------------------    DIAGNOSIS: Breast cancer  STAGE:   II     ;GOALS: Cure  CURRENT/MOST RECENT  THERAPY: Aromatase inhibitor    Carcinoma of upper-outer quadrant of right breast in female, estrogen receptor positive ([place]     Chief Complaint: breast cancer   History of present illness:Sandra Nguyen 49y.o.  female with history of stage II triple positive breast cancer currently on anastrozole is here for follow-up.  Patient denies any nausea vomiting headaches but denies any chest pain.  Denies any cough.  Observation/objective:  Assessment and plan: Carcinoma of upper-outer quadrant of right breast in female, estrogen receptor positive ([place] #Stage II ER/PR positive HER-2/neu positive right breast cancer-clinically, no evidence of recurrence today.  Currently on anastrozole.  Stable.  #Abnormal tumor markers-unlikely secondary to active malignancy.  Monitor for now.  #  DISPOSITION: # Follow up in 3 months- MD;2-3 days prior- labs- cbc/cmp/ ca-27-29; Ca15-3-Dr.B  Follow-up instructions:  I discussed the assessment and treatment plan with the patient.  The patient was provided an opportunity to ask questions and all were answered.  The patient agreed with the plan and demonstrated understanding of instructions.  The patient was advised to call back or seek an in person evaluation if the symptoms worsen or if the condition fails to improve as anticipated.  Dr. GCharlaine DaltonCStrausstownat [facility]4/26/2021 7:48 PM

## 2020-04-14 ENCOUNTER — Inpatient Hospital Stay: Admission: RE | Admit: 2020-04-14 | Payer: BC Managed Care – PPO | Source: Ambulatory Visit

## 2020-06-19 ENCOUNTER — Inpatient Hospital Stay: Payer: BC Managed Care – PPO | Attending: Internal Medicine

## 2020-06-19 ENCOUNTER — Other Ambulatory Visit: Payer: Self-pay

## 2020-06-19 DIAGNOSIS — Z79899 Other long term (current) drug therapy: Secondary | ICD-10-CM | POA: Diagnosis not present

## 2020-06-19 DIAGNOSIS — Z170 Estrogen receptor positive status [ER+]: Secondary | ICD-10-CM | POA: Diagnosis not present

## 2020-06-19 DIAGNOSIS — R600 Localized edema: Secondary | ICD-10-CM | POA: Diagnosis not present

## 2020-06-19 DIAGNOSIS — Z79811 Long term (current) use of aromatase inhibitors: Secondary | ICD-10-CM | POA: Diagnosis not present

## 2020-06-19 DIAGNOSIS — Z9011 Acquired absence of right breast and nipple: Secondary | ICD-10-CM | POA: Diagnosis not present

## 2020-06-19 DIAGNOSIS — C50411 Malignant neoplasm of upper-outer quadrant of right female breast: Secondary | ICD-10-CM | POA: Diagnosis not present

## 2020-06-19 DIAGNOSIS — Z9221 Personal history of antineoplastic chemotherapy: Secondary | ICD-10-CM | POA: Insufficient documentation

## 2020-06-19 DIAGNOSIS — R978 Other abnormal tumor markers: Secondary | ICD-10-CM | POA: Diagnosis not present

## 2020-06-19 LAB — COMPREHENSIVE METABOLIC PANEL
ALT: 31 U/L (ref 0–44)
AST: 34 U/L (ref 15–41)
Albumin: 4.2 g/dL (ref 3.5–5.0)
Alkaline Phosphatase: 103 U/L (ref 38–126)
Anion gap: 11 (ref 5–15)
BUN: 18 mg/dL (ref 6–20)
CO2: 26 mmol/L (ref 22–32)
Calcium: 9.2 mg/dL (ref 8.9–10.3)
Chloride: 102 mmol/L (ref 98–111)
Creatinine, Ser: 0.96 mg/dL (ref 0.44–1.00)
GFR calc Af Amer: >60 mL/min (ref >60)
GFR calc non Af Amer: >60 mL/min (ref >60)
Glucose, Bld: 158 mg/dL — ABNORMAL HIGH (ref 70–99)
Potassium: 3.9 mmol/L (ref 3.5–5.1)
Sodium: 139 mmol/L (ref 135–145)
Total Bilirubin: 0.5 mg/dL (ref 0.3–1.2)
Total Protein: 7.3 g/dL (ref 6.5–8.1)

## 2020-06-19 LAB — CBC WITH DIFFERENTIAL/PLATELET
Abs Immature Granulocytes: 0.01 10*3/uL (ref 0.00–0.07)
Basophils Absolute: 0 10*3/uL (ref 0.0–0.1)
Basophils Relative: 0 %
Eosinophils Absolute: 0.1 10*3/uL (ref 0.0–0.5)
Eosinophils Relative: 2 %
HCT: 37.3 % (ref 36.0–46.0)
Hemoglobin: 12.1 g/dL (ref 12.0–15.0)
Immature Granulocytes: 0 %
Lymphocytes Relative: 47 %
Lymphs Abs: 1.9 10*3/uL (ref 0.7–4.0)
MCH: 28.1 pg (ref 26.0–34.0)
MCHC: 32.4 g/dL (ref 30.0–36.0)
MCV: 86.5 fL (ref 80.0–100.0)
Monocytes Absolute: 0.3 10*3/uL (ref 0.1–1.0)
Monocytes Relative: 6 %
Neutro Abs: 1.9 10*3/uL (ref 1.7–7.7)
Neutrophils Relative %: 45 %
Platelets: 217 10*3/uL (ref 150–400)
RBC: 4.31 MIL/uL (ref 3.87–5.11)
RDW: 13.2 % (ref 11.5–15.5)
WBC: 4.2 10*3/uL (ref 4.0–10.5)
nRBC: 0 % (ref 0.0–0.2)

## 2020-06-20 LAB — CANCER ANTIGEN 15-3: CA 15-3: 27.7 U/mL — ABNORMAL HIGH (ref 0.0–25.0)

## 2020-06-20 LAB — CANCER ANTIGEN 27.29: CA 27.29: 32.4 U/mL (ref 0.0–38.6)

## 2020-06-23 ENCOUNTER — Other Ambulatory Visit: Payer: Self-pay

## 2020-06-23 ENCOUNTER — Inpatient Hospital Stay (HOSPITAL_BASED_OUTPATIENT_CLINIC_OR_DEPARTMENT_OTHER): Payer: BC Managed Care – PPO | Admitting: Internal Medicine

## 2020-06-23 DIAGNOSIS — C50411 Malignant neoplasm of upper-outer quadrant of right female breast: Secondary | ICD-10-CM

## 2020-06-23 DIAGNOSIS — Z170 Estrogen receptor positive status [ER+]: Secondary | ICD-10-CM | POA: Diagnosis not present

## 2020-06-23 NOTE — Progress Notes (Signed)
I connected with Donise A Umble on 06/23/20 at  3:30 PM EDT by video enabled telemedicine visit and verified that I am speaking with the correct person using two identifiers.  I discussed the limitations, risks, security and privacy concerns of performing an evaluation and management service by telemedicine and the availability of in-person appointments. I also discussed with the patient that there may be a patient responsible charge related to this service. The patient expressed understanding and agreed to proceed.    Other persons participating in the visit and their role in the encounter: RN/medical reconciliation Patient's location: Home Provider's location: Office  Oncology History Overview Note  1. 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  Estrogen receptor positive.  Progesterone receptor positive.  HER-2/neu receptor amplified. 2. Started on neoadjuvant chemotherapy,  Taxotere, carboplatin and Herceptin and perjeta (March 05, 2014) 3. Patient does not have any clinically significant BRCA mutation Has been on and to [place] Mutation of unknown significance. . 4. 4 cycles of chemotherapy with TCH,perjeta previous did not June of 201 5.  Because of progressing side effect remaining 2 cycles of chemotherapy was not given but patient continued Herceptin and aperjeta  5. Status post right breast simple mastectomy in August of 2015 ypT1  ypN0 stage IB 6.patient started on tamoxifen in October of 2015 along with maintenance Herceptin therapy Patient is going for reconstructive surgery on October 21, 2014 final reconstructive surgery was done in February of 2016 [name] finished Herceptin therapy on March 11, 2015  # Sep 2019- STOP TAM; start Arimidex 57m/day.   # TAH & BSO [Dr.Harris; April 2019- menorrhagia] -------------------------------------------------    DIAGNOSIS: Breast cancer  STAGE:   II     ;GOALS: Cure  CURRENT/MOST RECENT  THERAPY: Aromatase inhibitor    Carcinoma of upper-outer quadrant of right breast in female, estrogen receptor positive ([place]     Chief Complaint: Breast cancer   History of present illness:Sandra Nguyen 49y.o.  female with history of ER/PR positive HER-2/neu positive breast cancer is here for follow-up.  In the interim patient has been evaluated by PCP for joint pains.  She started on meloxicam.  Noted to have improvement of her joint pains.  Also noted to have some swelling of the legs.  Recommended 2D echo.  Awaiting this week.  Observation/objective:  Assessment and plan: Carcinoma of upper-outer quadrant of right breast in female, estrogen receptor positive ([place] #Stage II ER/PR positive HER-2/neu positive right breast cancer-clinically, no evidence of recurrence today.  Currently on Anastrozole. STABLE.   #Abnormal tumor markers-intermittently elevated; unlikely secondary to active malignancy.  Stable.  #  DISPOSITION: # Follow up in 4 months- MD;2-3 days prior- labs- cbc/cmp/ ca-27-29; Ca15-3-Dr.B  Follow-up instructions:  I discussed the assessment and treatment plan with the patient.  The patient was provided an opportunity to ask questions and all were answered.  The patient agreed with the plan and demonstrated understanding of instructions.  The patient was advised to call back or seek an in person evaluation if the symptoms worsen or if the condition fails to improve as anticipated.  Dr. GCharlaine DaltonCPerrytonat [facility]7/20/2021 3:49 PM

## 2020-06-23 NOTE — Assessment & Plan Note (Signed)
#  Stage II ER/PR positive HER-2/neu positive right breast cancer-clinically, no evidence of recurrence today.  Currently on Anastrozole. STABLE.   #Abnormal tumor markers-intermittently elevated; unlikely secondary to active malignancy.  Stable.  #  DISPOSITION: # Follow up in 4 months- MD;2-3 days prior- labs- cbc/cmp/ ca-27-29; Ca15-3-Dr.B

## 2020-08-27 ENCOUNTER — Other Ambulatory Visit: Payer: Self-pay | Admitting: Internal Medicine

## 2020-10-16 ENCOUNTER — Inpatient Hospital Stay: Payer: BC Managed Care – PPO | Attending: Internal Medicine

## 2020-10-16 ENCOUNTER — Other Ambulatory Visit: Payer: Self-pay

## 2020-10-16 DIAGNOSIS — C50411 Malignant neoplasm of upper-outer quadrant of right female breast: Secondary | ICD-10-CM | POA: Diagnosis not present

## 2020-10-16 DIAGNOSIS — Z79811 Long term (current) use of aromatase inhibitors: Secondary | ICD-10-CM | POA: Insufficient documentation

## 2020-10-16 DIAGNOSIS — Z170 Estrogen receptor positive status [ER+]: Secondary | ICD-10-CM | POA: Diagnosis not present

## 2020-10-16 LAB — COMPREHENSIVE METABOLIC PANEL
ALT: 35 U/L (ref 0–44)
AST: 122 U/L — ABNORMAL HIGH (ref 15–41)
Albumin: 4.5 g/dL (ref 3.5–5.0)
Alkaline Phosphatase: 92 U/L (ref 38–126)
Anion gap: 8 (ref 5–15)
BUN: 11 mg/dL (ref 6–20)
CO2: 25 mmol/L (ref 22–32)
Calcium: 9.3 mg/dL (ref 8.9–10.3)
Chloride: 104 mmol/L (ref 98–111)
Creatinine, Ser: 1.04 mg/dL — ABNORMAL HIGH (ref 0.44–1.00)
GFR, Estimated: >60 mL/min (ref >60)
Glucose, Bld: 94 mg/dL (ref 70–99)
Potassium: 3.5 mmol/L (ref 3.5–5.1)
Sodium: 137 mmol/L (ref 135–145)
Total Bilirubin: 0.4 mg/dL (ref 0.3–1.2)
Total Protein: 7.5 g/dL (ref 6.5–8.1)

## 2020-10-16 LAB — CBC WITH DIFFERENTIAL/PLATELET
Abs Immature Granulocytes: 0.01 10*3/uL (ref 0.00–0.07)
Basophils Absolute: 0 10*3/uL (ref 0.0–0.1)
Basophils Relative: 0 %
Eosinophils Absolute: 0.1 10*3/uL (ref 0.0–0.5)
Eosinophils Relative: 1 %
HCT: 36.7 % (ref 36.0–46.0)
Hemoglobin: 11.9 g/dL — ABNORMAL LOW (ref 12.0–15.0)
Immature Granulocytes: 0 %
Lymphocytes Relative: 50 %
Lymphs Abs: 3.2 10*3/uL (ref 0.7–4.0)
MCH: 28.1 pg (ref 26.0–34.0)
MCHC: 32.4 g/dL (ref 30.0–36.0)
MCV: 86.8 fL (ref 80.0–100.0)
Monocytes Absolute: 0.5 10*3/uL (ref 0.1–1.0)
Monocytes Relative: 8 %
Neutro Abs: 2.7 10*3/uL (ref 1.7–7.7)
Neutrophils Relative %: 41 %
Platelets: 233 10*3/uL (ref 150–400)
RBC: 4.23 MIL/uL (ref 3.87–5.11)
RDW: 13.7 % (ref 11.5–15.5)
WBC: 6.4 10*3/uL (ref 4.0–10.5)
nRBC: 0 % (ref 0.0–0.2)

## 2020-10-17 LAB — CANCER ANTIGEN 27.29: CA 27.29: 33.9 U/mL (ref 0.0–38.6)

## 2020-10-17 LAB — CANCER ANTIGEN 15-3: CA 15-3: 32 U/mL — ABNORMAL HIGH (ref 0.0–25.0)

## 2020-10-19 ENCOUNTER — Inpatient Hospital Stay (HOSPITAL_BASED_OUTPATIENT_CLINIC_OR_DEPARTMENT_OTHER): Payer: BC Managed Care – PPO | Admitting: Internal Medicine

## 2020-10-19 DIAGNOSIS — Z170 Estrogen receptor positive status [ER+]: Secondary | ICD-10-CM

## 2020-10-19 DIAGNOSIS — C50411 Malignant neoplasm of upper-outer quadrant of right female breast: Secondary | ICD-10-CM | POA: Diagnosis not present

## 2020-10-19 NOTE — Assessment & Plan Note (Addendum)
#  Stage II ER/PR positive HER-2/neu positive right breast cancer-clinically, no evidence of recurrence today.  Currently on Anastrozole.  Clinically STABLE; however- see discussion below.   #Abnormal tumor markers-intermittently elevated-again elevated today.  Recommend CT scan of the chest and pelvis for further evaluation.   # BMD- Jan 2021- T score: 0.9- NORMAL.  Continue calcium plus vitamin D. STABLE.  We will repeat bone density again in 2 years.  #  DISPOSITION: will call with results.  # CT C/A/P next week. # Follow up in 4 months- MD;2-3 days prior- labs- cbc/cmp/ ca-27-29; Ca15-3-Dr.B

## 2020-10-19 NOTE — Progress Notes (Signed)
I connected with Yeraldin A Beckstead on 10/19/2020 at  3:00 PM EST by video enabled telemedicine visit and verified that I am speaking with the correct person using two identifiers.  I discussed the limitations, risks, security and privacy concerns of performing an evaluation and management service by telemedicine and the availability of in-person appointments. I also discussed with the patient that there may be a patient responsible charge related to this service. The patient expressed understanding and agreed to proceed.    Other persons participating in the visit and their role in the encounter: RN/medical reconciliation Patients location: Home Providers location: office  Oncology History Overview Note  1. 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  Estrogen receptor positive.  Progesterone receptor positive.  HER-2/neu receptor amplified. 2. Started on neoadjuvant chemotherapy,  Taxotere, carboplatin and Herceptin and perjeta (March 05, 2014) 3. Patient does not have any clinically significant BRCA mutation Has been on and to [place] Mutation of unknown significance. . 4. 4 cycles of chemotherapy with TCH,perjeta previous did not June of 201 5.  Because of progressing side effect remaining 2 cycles of chemotherapy was not given but patient continued Herceptin and aperjeta  5. Status post right breast simple mastectomy in August of 2015 ypT1  ypN0 stage IB 6.patient started on tamoxifen in October of 2015 along with maintenance Herceptin therapy Patient is going for reconstructive surgery on October 21, 2014 final reconstructive surgery was done in February of 2016 [name] finished Herceptin therapy on March 11, 2015  # Sep 2019- STOP TAM; start Arimidex 1m/day.   # TAH & BSO [Dr.Harris; April 2019- menorrhagia] -------------------------------------------------    DIAGNOSIS: Breast cancer  STAGE:   II     ;GOALS: Cure  CURRENT/MOST RECENT  THERAPY: Aromatase inhibitor    Carcinoma of upper-outer quadrant of right breast in female, estrogen receptor positive ([place]     Chief Complaint: breast cancer    History of present illness:Sandra Nguyen 49y.o.  female with history of stage II ER/PR positive HER-2/neu positive breast cancer is here for follow-up/review results of the recent blood work.  Patient denies any new shortness of breath or cough he denies any chest pain.  Denies any onset of bone pain.  Chronic myalgias.  Not any worse.  Otherwise appetite is good.  No nausea no vomiting.  However concerned about the rising/intermittently elevated tumor markers.  Observation/objective:  Assessment and plan: Carcinoma of upper-outer quadrant of right breast in female, estrogen receptor positive ([place] #Stage II ER/PR positive HER-2/neu positive right breast cancer-clinically, no evidence of recurrence today.  Currently on Anastrozole.  Clinically STABLE; however- see discussion below.   #Abnormal tumor markers-intermittently elevated-again elevated today.  Recommend CT scan of the chest and pelvis for further evaluation.   # BMD- Jan 2021- T score: 0.9- NORMAL.  Continue calcium plus vitamin D. STABLE.  We will repeat bone density again in 2 years.  #  DISPOSITION: will call with results.  # CT C/A/P next week. # Follow up in 4 months- MD;2-3 days prior- labs- cbc/cmp/ ca-27-29; Ca15-3-Dr.B   Follow-up instructions:  I discussed the assessment and treatment plan with the patient.  The patient was provided an opportunity to ask questions and all were answered.  The patient agreed with the plan and demonstrated understanding of instructions.  The patient was advised to call back or seek an in person evaluation if the symptoms worsen or if the condition fails to improve as anticipated.  Dr. Charlaine Dalton Hardy at [facility] 10/20/2020 8:01 AM

## 2020-10-28 ENCOUNTER — Ambulatory Visit: Admission: RE | Admit: 2020-10-28 | Payer: BC Managed Care – PPO | Source: Ambulatory Visit

## 2020-11-24 ENCOUNTER — Other Ambulatory Visit: Payer: Self-pay

## 2020-11-24 ENCOUNTER — Ambulatory Visit
Admission: RE | Admit: 2020-11-24 | Discharge: 2020-11-24 | Disposition: A | Discharge status: Home / Self Care | Payer: BC Managed Care – PPO | Source: Ambulatory Visit | Attending: Internal Medicine | Admitting: Internal Medicine

## 2020-11-24 DIAGNOSIS — C50411 Malignant neoplasm of upper-outer quadrant of right female breast: Secondary | ICD-10-CM | POA: Insufficient documentation

## 2020-11-24 DIAGNOSIS — Z170 Estrogen receptor positive status [ER+]: Secondary | ICD-10-CM | POA: Insufficient documentation

## 2020-11-24 MED ORDER — IOHEXOL 300 MG/ML  SOLN
100.0000 mL | Freq: Once | INTRAMUSCULAR | Status: AC | PRN
  Administered 2020-11-24: 12:00:00 100 mL via INTRAVENOUS

## 2020-11-26 ENCOUNTER — Telehealth: Payer: Self-pay | Admitting: Internal Medicine

## 2020-11-26 NOTE — Telephone Encounter (Signed)
On 12/22-the patient regarding results of the CT scans- negative for any recurrent malignancy.  Recommend follow-up as planned.

## 2020-11-29 ENCOUNTER — Other Ambulatory Visit: Payer: Self-pay | Admitting: Internal Medicine

## 2020-12-01 NOTE — Telephone Encounter (Signed)
Sandra Nguyen - Please review chart to see if RF is appropriate.

## 2021-01-26 ENCOUNTER — Ambulatory Visit
Admission: RE | Admit: 2021-01-26 | Discharge: 2021-01-26 | Disposition: A | Discharge status: Home / Self Care | Payer: BC Managed Care – PPO | Source: Ambulatory Visit | Attending: General Surgery | Admitting: General Surgery

## 2021-01-26 ENCOUNTER — Other Ambulatory Visit: Payer: Self-pay | Admitting: General Surgery

## 2021-01-26 ENCOUNTER — Other Ambulatory Visit: Payer: Self-pay

## 2021-01-26 DIAGNOSIS — N644 Mastodynia: Secondary | ICD-10-CM

## 2021-01-26 DIAGNOSIS — Z853 Personal history of malignant neoplasm of breast: Secondary | ICD-10-CM | POA: Insufficient documentation

## 2021-02-09 ENCOUNTER — Ambulatory Visit
Admission: RE | Admit: 2021-02-09 | Discharge: 2021-02-09 | Disposition: A | Discharge status: Home / Self Care | Payer: BC Managed Care – PPO | Source: Ambulatory Visit | Attending: General Surgery | Admitting: General Surgery

## 2021-02-09 ENCOUNTER — Other Ambulatory Visit: Payer: Self-pay

## 2021-02-09 DIAGNOSIS — N644 Mastodynia: Secondary | ICD-10-CM

## 2021-02-09 HISTORY — DX: Personal history of antineoplastic chemotherapy: Z92.21

## 2021-02-12 ENCOUNTER — Inpatient Hospital Stay: Payer: BC Managed Care – PPO | Attending: Internal Medicine

## 2021-02-12 ENCOUNTER — Other Ambulatory Visit: Payer: Self-pay

## 2021-02-12 ENCOUNTER — Other Ambulatory Visit: Payer: Self-pay | Admitting: *Deleted

## 2021-02-12 DIAGNOSIS — Z170 Estrogen receptor positive status [ER+]: Secondary | ICD-10-CM | POA: Insufficient documentation

## 2021-02-12 DIAGNOSIS — Z79811 Long term (current) use of aromatase inhibitors: Secondary | ICD-10-CM | POA: Insufficient documentation

## 2021-02-12 DIAGNOSIS — R978 Other abnormal tumor markers: Secondary | ICD-10-CM

## 2021-02-12 DIAGNOSIS — C50411 Malignant neoplasm of upper-outer quadrant of right female breast: Secondary | ICD-10-CM

## 2021-02-15 ENCOUNTER — Inpatient Hospital Stay: Payer: BC Managed Care – PPO | Admitting: Internal Medicine

## 2021-02-26 ENCOUNTER — Other Ambulatory Visit: Payer: Self-pay

## 2021-02-26 ENCOUNTER — Inpatient Hospital Stay: Payer: BC Managed Care – PPO

## 2021-02-26 DIAGNOSIS — Z79811 Long term (current) use of aromatase inhibitors: Secondary | ICD-10-CM | POA: Diagnosis not present

## 2021-02-26 DIAGNOSIS — C50411 Malignant neoplasm of upper-outer quadrant of right female breast: Secondary | ICD-10-CM | POA: Diagnosis present

## 2021-02-26 DIAGNOSIS — Z170 Estrogen receptor positive status [ER+]: Secondary | ICD-10-CM | POA: Diagnosis not present

## 2021-02-26 LAB — COMPREHENSIVE METABOLIC PANEL
ALT: 24 U/L (ref 0–44)
AST: 25 U/L (ref 15–41)
Albumin: 4.3 g/dL (ref 3.5–5.0)
Alkaline Phosphatase: 93 U/L (ref 38–126)
Anion gap: 8 (ref 5–15)
BUN: 14 mg/dL (ref 6–20)
CO2: 25 mmol/L (ref 22–32)
Calcium: 9 mg/dL (ref 8.9–10.3)
Chloride: 105 mmol/L (ref 98–111)
Creatinine, Ser: 0.96 mg/dL (ref 0.44–1.00)
GFR, Estimated: >60 mL/min (ref >60)
Glucose, Bld: 110 mg/dL — ABNORMAL HIGH (ref 70–99)
Potassium: 3.8 mmol/L (ref 3.5–5.1)
Sodium: 138 mmol/L (ref 135–145)
Total Bilirubin: 0.3 mg/dL (ref 0.3–1.2)
Total Protein: 7 g/dL (ref 6.5–8.1)

## 2021-02-26 LAB — CBC WITH DIFFERENTIAL/PLATELET
Abs Immature Granulocytes: 0.01 10*3/uL (ref 0.00–0.07)
Basophils Absolute: 0 10*3/uL (ref 0.0–0.1)
Basophils Relative: 0 %
Eosinophils Absolute: 0.1 10*3/uL (ref 0.0–0.5)
Eosinophils Relative: 2 %
HCT: 36.6 % (ref 36.0–46.0)
Hemoglobin: 12 g/dL (ref 12.0–15.0)
Immature Granulocytes: 0 %
Lymphocytes Relative: 50 %
Lymphs Abs: 2.3 10*3/uL (ref 0.7–4.0)
MCH: 28.3 pg (ref 26.0–34.0)
MCHC: 32.8 g/dL (ref 30.0–36.0)
MCV: 86.3 fL (ref 80.0–100.0)
Monocytes Absolute: 0.3 10*3/uL (ref 0.1–1.0)
Monocytes Relative: 7 %
Neutro Abs: 1.9 10*3/uL (ref 1.7–7.7)
Neutrophils Relative %: 41 %
Platelets: 243 10*3/uL (ref 150–400)
RBC: 4.24 MIL/uL (ref 3.87–5.11)
RDW: 13 % (ref 11.5–15.5)
WBC: 4.6 10*3/uL (ref 4.0–10.5)
nRBC: 0 % (ref 0.0–0.2)

## 2021-02-27 LAB — CANCER ANTIGEN 15-3: CA 15-3: 26.8 U/mL — ABNORMAL HIGH (ref 0.0–25.0)

## 2021-02-27 LAB — CANCER ANTIGEN 27.29: CA 27.29: 36 U/mL (ref 0.0–38.6)

## 2021-03-01 ENCOUNTER — Inpatient Hospital Stay: Payer: BC Managed Care – PPO | Admitting: Internal Medicine

## 2021-03-05 ENCOUNTER — Inpatient Hospital Stay: Payer: BC Managed Care – PPO | Attending: Internal Medicine | Admitting: Internal Medicine

## 2021-03-05 DIAGNOSIS — Z1502 Genetic susceptibility to malignant neoplasm of ovary: Secondary | ICD-10-CM | POA: Insufficient documentation

## 2021-03-05 DIAGNOSIS — Z170 Estrogen receptor positive status [ER+]: Secondary | ICD-10-CM | POA: Diagnosis not present

## 2021-03-05 DIAGNOSIS — Z79899 Other long term (current) drug therapy: Secondary | ICD-10-CM | POA: Insufficient documentation

## 2021-03-05 DIAGNOSIS — Z79811 Long term (current) use of aromatase inhibitors: Secondary | ICD-10-CM | POA: Diagnosis not present

## 2021-03-05 DIAGNOSIS — M2550 Pain in unspecified joint: Secondary | ICD-10-CM | POA: Insufficient documentation

## 2021-03-05 DIAGNOSIS — C50411 Malignant neoplasm of upper-outer quadrant of right female breast: Secondary | ICD-10-CM | POA: Diagnosis present

## 2021-03-05 DIAGNOSIS — Z9221 Personal history of antineoplastic chemotherapy: Secondary | ICD-10-CM | POA: Diagnosis not present

## 2021-03-05 DIAGNOSIS — Z90722 Acquired absence of ovaries, bilateral: Secondary | ICD-10-CM | POA: Diagnosis not present

## 2021-03-05 DIAGNOSIS — F419 Anxiety disorder, unspecified: Secondary | ICD-10-CM | POA: Diagnosis not present

## 2021-03-05 DIAGNOSIS — Z9011 Acquired absence of right breast and nipple: Secondary | ICD-10-CM | POA: Diagnosis not present

## 2021-03-05 DIAGNOSIS — Z1501 Genetic susceptibility to malignant neoplasm of breast: Secondary | ICD-10-CM | POA: Diagnosis not present

## 2021-03-05 DIAGNOSIS — N920 Excessive and frequent menstruation with regular cycle: Secondary | ICD-10-CM | POA: Insufficient documentation

## 2021-03-05 DIAGNOSIS — Z1509 Genetic susceptibility to other malignant neoplasm: Secondary | ICD-10-CM | POA: Insufficient documentation

## 2021-03-05 NOTE — Progress Notes (Signed)
[name] OFFICE PROGRESS NOTE  Patient Care Team: [name], MD as PCP - General (Family Medicine) Dear, [name], MD (Inactive) (Family Medicine) Bary Castilla, Forest Gleason, MD (General Surgery) Forest Gleason, MD (Inactive) (Unknown Physician Specialty) Thornton Park, MD as Consulting Physician (Orthopedic Surgery)  Cancer Staging Carcinoma of upper-outer quadrant of right breast in female, estrogen receptor positive [facility]) Staging form: Breast, AJCC 7th Edition - Clinical: Stage IIA (T2, N0, M0) - Signed by Forest Gleason, MD on 05/09/2015 Laterality: Right Estrogen receptor status: Positive Progesterone receptor status: Positive HER2 status: Positive    Oncology History Overview Note  1. 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  Estrogen receptor positive.  Progesterone receptor positive.  HER-2/neu receptor amplified. 2. Started on neoadjuvant chemotherapy,  Taxotere, carboplatin and Herceptin and perjeta (March 05, 2014) 3. Patient does not have any clinically significant BRCA mutation Has been on and to [place] Mutation of unknown significance. . 4. 4 cycles of chemotherapy with TCH,perjeta previous did not June of 201 5.  Because of progressing side effect remaining 2 cycles of chemotherapy was not given but patient continued Herceptin and aperjeta  5. Status post right breast simple mastectomy in August of 2015 ypT1  ypN0 stage IB 6.patient started on tamoxifen in October of 2015 along with maintenance Herceptin therapy Patient is going for reconstructive surgery on October 21, 2014 final reconstructive surgery was done in February of 2016 [name] finished Herceptin therapy on March 11, 2015  # Sep 2019- STOP TAM; start Arimidex 62m/day.   # TAH & BSO [Dr.Harris; April 2019- menorrhagia] -------------------------------------------------    DIAGNOSIS: Breast cancer  STAGE:   II     ;GOALS: Cure  CURRENT/MOST RECENT  THERAPY: Aromatase inhibitor    Carcinoma of upper-outer quadrant of right breast in female, estrogen receptor positive ([place]   INTERVAL HISTORY:  Sandra Nguyen 50y.o.  female right-sided stage II ER PR positive HER-2/neu positive status post mastectomy currently on anastrozole [TAH/BSO] is here for follow-up.  Patient denies any new lumps or bumps.  Appetite is good.  No weight loss.  No headaches.  Chronic mild joint pains. Review of Systems  Constitutional: Positive for malaise/fatigue. Negative for chills, diaphoresis and fever.  HENT: Negative.  Negative for nosebleeds and sore throat.   Eyes: Negative.  Negative for double vision.  Respiratory: Negative.  Negative for cough, hemoptysis, sputum production, shortness of breath and wheezing.   Cardiovascular: Negative.  Negative for chest pain, palpitations, orthopnea and leg swelling.  Gastrointestinal: Negative.  Negative for abdominal pain, blood in stool, constipation, diarrhea, heartburn, melena, nausea and vomiting.  Genitourinary: Negative.  Negative for dysuria, frequency and urgency.  Musculoskeletal: Positive for joint pain and myalgias. Negative for back pain.  Skin: Negative.  Negative for itching and rash.  Neurological: Negative for dizziness, tingling, focal weakness and weakness.  Endo/Heme/Allergies: Negative.  Does not bruise/bleed easily.  Psychiatric/Behavioral: Negative.  Negative for depression. The patient is not nervous/anxious and does not have insomnia.      REVIEW OF SYSTEMS:  A complete 10 point review of system is done which is negative except mentioned above/history of present illness.   PAST MEDICAL HISTORY :  Past Medical History:  Diagnosis Date  . Anemia   . Anxiety   . Atypical squamous cell changes of undetermined significance (ASCUS) on cervical cytology with negative high risk human papilloma virus (HPV) test result 06/20/2016  . Breast cancer ([facility] March 2015  Clinical T1c, N0, BRCA  negative- Right  . Dysplasia of cervix, low grade (CIN 1)   . 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 Malignant neoplasm of upper-outer quadrant of female breast [facility]) August 2015   pT1b, N0; M0. 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 Menorrhagia   . Migraines    migraines  . Personal history of chemotherapy   . Vitamin D deficiency     PAST SURGICAL HISTORY :   Past Surgical History:  Procedure Laterality Date  . ABDOMINAL HYSTERECTOMY    . AUGMENTATION MAMMAPLASTY Bilateral Nov 2016   Dr Tula Nakayama REMOVED 07/2016  . BREAST BIOPSY Right 02/2014   positive  . BREAST RECONSTRUCTION Right 04/14/2015   Procedure: AEROLA/NIPPLE RECONSTRUCTION WITH GRAFT;  Surgeon: Nicholaus Bloom, MD;  Location: [place];  Service: Plastics;  Laterality: Right;  . BREAST SURGERY Right July 10, 2014   Right simple mastectomy with sentinel node biopsy.  . CERVICAL BIOPSY  W/ LOOP ELECTRODE EXCISION  1998  . COLONOSCOPY WITH PROPOFOL N/A 05/04/2016   Procedure: COLONOSCOPY WITH PROPOFOL;  Surgeon: Robert Bellow, MD;  Location: [facility] ENDOSCOPY;  Service: Endoscopy;  Laterality: N/A;  . CYSTOSCOPY  03/15/2018   Procedure: CYSTOSCOPY;  Surgeon: Gae Dry, MD;  Location: [facility] ORS;  Service: Gynecology;;  . DILATATION & CURETTAGE/HYSTEROSCOPY WITH MYOSURE N/A 08/03/2017   Procedure: [place];  Surgeon: Gae Dry, MD;  Location: [facility] ORS;  Service: Gynecology;  Laterality: N/A;  . ESSURE TUBAL LIGATION  09/15/2010  . EXCISIONAL HEMORRHOIDECTOMY  2015  . I and D of hemorrhoid  05/2017   Dr. Jamal Collin  . LAPAROSCOPIC HYSTERECTOMY Bilateral 03/15/2018   Procedure: HYSTERECTOMY TOTAL LAPAROSCOPIC BILATERAL SALPINGO OOPHORECTOMY;  Surgeon: Gae Dry, MD;  Location: [facility] ORS;  Service: Gynecology;  Laterality: Bilateral;  . MASTECTOMY Right 07/2014   chemo and herceptin tx  . PORTACATH PLACEMENT    .  TUBAL LIGATION      FAMILY HISTORY :   Family History  Problem Relation Age of Onset  . Cervical cancer Maternal Aunt 65  . Hypertension Mother   . Heart disease Maternal Grandmother   . Breast cancer Paternal Aunt 61  . Ovarian cancer Neg Hx   . Colon cancer Neg Hx     SOCIAL HISTORY:   Social History   Tobacco Use  . Smoking status: Never Smoker  . Smokeless tobacco: Never Used  Vaping Use  . Vaping Use: Never used  Substance Use Topics  . Alcohol use: No  . Drug use: No    ALLERGIES:  is allergic to cephalexin.  MEDICATIONS:  Current Outpatient Medications  Medication Sig Dispense Refill  . anastrozole (ARIMIDEX) 1 MG tablet TAKE 1 TABLET BY MOUTH EVERY DAY 90 tablet 3  . Biotin 10000 MCG TABS Take 10,000 mcg by mouth at bedtime.    . clonazePAM (KLONOPIN) 0.5 MG tablet Take 1 mg by mouth at bedtime.    . cloNIDine (CATAPRES) 0.1 MG tablet TAKE 1 TABLET (0.1 MG TOTAL) BY MOUTH DAILY. FOR HOT FLASHES 90 tablet 1  . levocetirizine (XYZAL) 5 MG tablet Take 5 mg by mouth every evening.     . mirtazapine (REMERON) 30 MG tablet TAKE 1 TABLET (30 MG TOTAL) BY MOUTH AT BEDTIME. 90 tablet 3  . Vitamin D, Ergocalciferol, (DRISDOL) 1.25 MG (50000 UNIT) CAPS capsule TAKE 1 CAPSULE BY MOUTH ONE TIME PER WEEK 12 capsule 2   No current  facility-administered medications for this visit.    PHYSICAL EXAMINATION: ECOG PERFORMANCE STATUS: 0 - Asymptomatic  BP 120/69   Pulse 64   Temp (!) 97.5 F (36.4 C) (Tympanic)   Resp 17   Wt 171 lb 3.2 oz (77.7 kg)   LMP 02/15/2018 (Exact Date)   SpO2 100%   BMI 30.33 kg/m   Filed Weights   03/05/21 1021  Weight: 171 lb 3.2 oz (77.7 kg)   Physical Exam HENT:     Head: Normocephalic and atraumatic.     Mouth/Throat:     Pharynx: No oropharyngeal exudate.  Eyes:     Pupils: Pupils are equal, round, and reactive to light.  Cardiovascular:     Rate and Rhythm: Normal rate and regular rhythm.  Pulmonary:     Effort: No  respiratory distress.     Breath sounds: No wheezing.  Abdominal:     General: Bowel sounds are normal. There is no distension.     Palpations: Abdomen is soft. There is no mass.     Tenderness: There is no abdominal tenderness. There is no guarding or rebound.  Musculoskeletal:        General: No tenderness. Normal range of motion.     Cervical back: Normal range of motion and neck supple.  Skin:    General: Skin is warm.  Neurological:     Mental Status: She is alert and oriented to person, place, and time.  Psychiatric:        Mood and Affect: Affect normal.      LABORATORY DATA:  I have reviewed the data as listed    Component Value Date/Time   NA 138 02/26/2021 1029   NA 143 12/31/2014 1313   K 3.8 02/26/2021 1029   K 3.5 12/31/2014 1313   CL 105 02/26/2021 1029   CL 110 (H) 12/31/2014 1313   CO2 25 02/26/2021 1029   CO2 28 12/31/2014 1313   GLUCOSE 110 (H) 02/26/2021 1029   GLUCOSE 124 (H) 12/31/2014 1313   BUN 14 02/26/2021 1029   BUN 13 12/31/2014 1313   CREATININE 0.96 02/26/2021 1029   CREATININE 0.89 12/31/2014 1313   CALCIUM 9.0 02/26/2021 1029   CALCIUM 8.4 (L) 12/31/2014 1313   PROT 7.0 02/26/2021 1029   PROT 6.9 12/31/2014 1313   ALBUMIN 4.3 02/26/2021 1029   ALBUMIN 3.5 12/31/2014 1313   AST 25 02/26/2021 1029   AST 21 12/31/2014 1313   ALT 24 02/26/2021 1029   ALT 23 12/31/2014 1313   ALKPHOS 93 02/26/2021 1029   ALKPHOS 66 12/31/2014 1313   BILITOT 0.3 02/26/2021 1029   BILITOT 0.2 12/31/2014 1313   GFRNONAA >60 02/26/2021 1029   GFRNONAA >60 12/31/2014 1313   GFRNONAA >60 07/16/2014 1351   GFRAA >60 06/19/2020 0802   GFRAA >60 12/31/2014 1313   GFRAA >60 07/16/2014 1351    No results found for: SPEP, UPEP  Lab Results  Component Value Date   WBC 4.6 02/26/2021   NEUTROABS 1.9 02/26/2021   HGB 12.0 02/26/2021   HCT 36.6 02/26/2021   MCV 86.3 02/26/2021   PLT 243 02/26/2021      Chemistry      Component Value Date/Time   NA  138 02/26/2021 1029   NA 143 12/31/2014 1313   K 3.8 02/26/2021 1029   K 3.5 12/31/2014 1313   CL 105 02/26/2021 1029   CL 110 (H) 12/31/2014 1313   CO2 25 02/26/2021 1029   CO2 28  12/31/2014 1313   BUN 14 02/26/2021 1029   BUN 13 12/31/2014 1313   CREATININE 0.96 02/26/2021 1029   CREATININE 0.89 12/31/2014 1313      Component Value Date/Time   CALCIUM 9.0 02/26/2021 1029   CALCIUM 8.4 (L) 12/31/2014 1313   ALKPHOS 93 02/26/2021 1029   ALKPHOS 66 12/31/2014 1313   AST 25 02/26/2021 1029   AST 21 12/31/2014 1313   ALT 24 02/26/2021 1029   ALT 23 12/31/2014 1313   BILITOT 0.3 02/26/2021 1029   BILITOT 0.2 12/31/2014 1313       RADIOGRAPHIC STUDIES: I have personally reviewed the radiological images as listed and agreed with the findings in the report. No results found.   ASSESSMENT & PLAN:  Carcinoma of upper-outer quadrant of right breast in female, estrogen receptor positive ([place]) #Stage II ER/PR positive HER-2/neu positive right breast cancer-clinically, no evidence of recurrence today.  Currently onExtended Anastrozole [Oct, 8299].  Clinically STABLE.  #Abnormal tumor markers-intermittently slightly abnormal elevated; ; however CT scan chest and pelvis-December 2021 NED.  As patient is clinically asymptomatic.  Continue surveillance; hold off on imaging patient in agreement.  # BMD- Jan 2021- T score: 0.9- NORMAL.  Continue calcium plus vitamin D. STABLE.  We will repeat bone density again in 2 years.  #  DISPOSITION:  # Follow up in 6 months- MD;2-3 days prior- labs- cbc/cmp/ ca-27-29; Ca15-3; vitD levels--Dr.B   Orders Placed This Encounter  Procedures  . CBC with Differential/Platelet    Standing Status:   Future    Standing Expiration Date:   03/05/2022  . Comprehensive metabolic panel    Standing Status:   Future    Standing Expiration Date:   03/05/2022  . Cancer antigen 27.29    Standing Status:   Future    Standing Expiration Date:   03/05/2022  . Cancer  antigen 15-3    Standing Status:   Future    Standing Expiration Date:   03/05/2022  . VITAMIN D 25 Hydroxy (Vit-D Deficiency, Fractures)    Standing Status:   Future    Standing Expiration Date:   03/05/2022      Cammie Sickle, MD 03/05/2021 12:27 PM

## 2021-03-05 NOTE — Assessment & Plan Note (Addendum)
#  Stage II ER/PR positive HER-2/neu positive right breast cancer-clinically, no evidence of recurrence today.  Currently onExtended Anastrozole [Oct, 3750].  Clinically STABLE.  #Abnormal tumor markers-intermittently slightly abnormal elevated; ; however CT scan chest and pelvis-December 2021 NED.  As patient is clinically asymptomatic.  Continue surveillance; hold off on imaging patient in agreement.  # BMD- Jan 2021- T score: 0.9- NORMAL.  Continue calcium plus vitamin D. STABLE.  We will repeat bone density again in 2 years.  #  DISPOSITION:  # Follow up in 6 months- MD;2-3 days prior- labs- cbc/cmp/ ca-27-29; Ca15-3; vitD levels--Dr.B

## 2021-03-05 NOTE — Progress Notes (Signed)
F/U Breast Ca. Pt offers no complaints or concerns today.

## 2021-04-09 ENCOUNTER — Encounter: Payer: Self-pay | Admitting: Internal Medicine

## 2021-04-09 MED ORDER — ANASTROZOLE 1 MG PO TABS: 1.0000 mg | ORAL_TABLET | Freq: Every day | ORAL | 3 refills | Status: AC

## 2021-05-08 ENCOUNTER — Other Ambulatory Visit: Payer: Self-pay | Admitting: Internal Medicine

## 2021-09-06 ENCOUNTER — Other Ambulatory Visit: Payer: Self-pay

## 2021-09-06 ENCOUNTER — Inpatient Hospital Stay (HOSPITAL_BASED_OUTPATIENT_CLINIC_OR_DEPARTMENT_OTHER): Payer: BC Managed Care – PPO | Admitting: Internal Medicine

## 2021-09-06 ENCOUNTER — Inpatient Hospital Stay: Payer: BC Managed Care – PPO | Attending: Internal Medicine | Admitting: Internal Medicine

## 2021-09-06 ENCOUNTER — Encounter: Payer: Self-pay | Admitting: Internal Medicine

## 2021-09-06 VITALS — BP 126/75 | HR 51 | Temp 98.6°F | Resp 20 | Wt 159.2 lb

## 2021-09-06 DIAGNOSIS — Z170 Estrogen receptor positive status [ER+]: Secondary | ICD-10-CM

## 2021-09-06 DIAGNOSIS — C50411 Malignant neoplasm of upper-outer quadrant of right female breast: Secondary | ICD-10-CM | POA: Insufficient documentation

## 2021-09-06 DIAGNOSIS — M810 Age-related osteoporosis without current pathological fracture: Secondary | ICD-10-CM

## 2021-09-06 DIAGNOSIS — Z9221 Personal history of antineoplastic chemotherapy: Secondary | ICD-10-CM | POA: Insufficient documentation

## 2021-09-06 DIAGNOSIS — Z79899 Other long term (current) drug therapy: Secondary | ICD-10-CM | POA: Diagnosis not present

## 2021-09-06 DIAGNOSIS — Z9079 Acquired absence of other genital organ(s): Secondary | ICD-10-CM | POA: Diagnosis not present

## 2021-09-06 DIAGNOSIS — Z90710 Acquired absence of both cervix and uterus: Secondary | ICD-10-CM | POA: Insufficient documentation

## 2021-09-06 DIAGNOSIS — Z9011 Acquired absence of right breast and nipple: Secondary | ICD-10-CM | POA: Diagnosis not present

## 2021-09-06 DIAGNOSIS — Z90722 Acquired absence of ovaries, bilateral: Secondary | ICD-10-CM | POA: Diagnosis not present

## 2021-09-06 DIAGNOSIS — Z79811 Long term (current) use of aromatase inhibitors: Secondary | ICD-10-CM | POA: Insufficient documentation

## 2021-09-06 LAB — CBC WITH DIFFERENTIAL/PLATELET
Abs Immature Granulocytes: 0.01 10*3/uL (ref 0.00–0.07)
Basophils Absolute: 0 10*3/uL (ref 0.0–0.1)
Basophils Relative: 0 %
Eosinophils Absolute: 0.1 10*3/uL (ref 0.0–0.5)
Eosinophils Relative: 2 %
HCT: 39.7 % (ref 36.0–46.0)
Hemoglobin: 12.6 g/dL (ref 12.0–15.0)
Immature Granulocytes: 0 %
Lymphocytes Relative: 44 %
Lymphs Abs: 2.3 10*3/uL (ref 0.7–4.0)
MCH: 27.9 pg (ref 26.0–34.0)
MCHC: 31.7 g/dL (ref 30.0–36.0)
MCV: 87.8 fL (ref 80.0–100.0)
Monocytes Absolute: 0.3 10*3/uL (ref 0.1–1.0)
Monocytes Relative: 6 %
Neutro Abs: 2.6 10*3/uL (ref 1.7–7.7)
Neutrophils Relative %: 48 %
Platelets: 224 10*3/uL (ref 150–400)
RBC: 4.52 MIL/uL (ref 3.87–5.11)
RDW: 13.4 % (ref 11.5–15.5)
WBC: 5.3 10*3/uL (ref 4.0–10.5)
nRBC: 0 % (ref 0.0–0.2)

## 2021-09-06 LAB — COMPREHENSIVE METABOLIC PANEL
ALT: 18 U/L (ref 0–44)
AST: 27 U/L (ref 15–41)
Albumin: 4.6 g/dL (ref 3.5–5.0)
Alkaline Phosphatase: 82 U/L (ref 38–126)
Anion gap: 10 (ref 5–15)
BUN: 11 mg/dL (ref 6–20)
CO2: 24 mmol/L (ref 22–32)
Calcium: 9.3 mg/dL (ref 8.9–10.3)
Chloride: 103 mmol/L (ref 98–111)
Creatinine, Ser: 0.8 mg/dL (ref 0.44–1.00)
GFR, Estimated: >60 mL/min (ref >60)
Glucose, Bld: 87 mg/dL (ref 70–99)
Potassium: 3.5 mmol/L (ref 3.5–5.1)
Sodium: 137 mmol/L (ref 135–145)
Total Bilirubin: 0.4 mg/dL (ref 0.3–1.2)
Total Protein: 8.1 g/dL (ref 6.5–8.1)

## 2021-09-06 LAB — VITAMIN D 25 HYDROXY (VIT D DEFICIENCY, FRACTURES): Vit D, 25-Hydroxy: 98.85 ng/mL (ref 30–100)

## 2021-09-06 NOTE — Assessment & Plan Note (Addendum)
#  Stage II ER/PR positive HER-2/neu positive right breast cancer-clinically, no evidence of recurrence today.  Currently on Extended Anastrozole [Oct, 2025].  Clinically  STABLE.   #Abnormal tumor markers-intermittently slightly abnormal elevated; ; however CT scan chest and pelvis-December 2021 NED.  As patient is clinically asymptomatic.  Continue surveillance; hold off on imaging patient in agreement.  Awaiting tumor markers from today.  # BMD- Jan 2021- T score: 0.9- NORMAL.  Continue calcium plus vitamin D. STABLE.  We will repeat bone density again in 2 years/ordered today.  #  DISPOSITION: my chart # Follow up in 6 months- MD;2-3 days prior- labs- cbc/cmp/ ca-27-29; Ca15-3; vitD levels; BMD--Dr.B

## 2021-09-06 NOTE — Progress Notes (Signed)
[facility] OFFICE PROGRESS NOTE  Patient Care Team: [name], MD as PCP - General (Family Medicine) Dear, [name], MD (Inactive) (Family Medicine) Bary Castilla, Forest Gleason, MD (General Surgery) Forest Gleason, MD (Inactive) (Unknown Physician Specialty) Thornton Park, MD as Consulting Physician (Orthopedic Surgery)  Cancer Staging Carcinoma of upper-outer quadrant of right breast in female, estrogen receptor positive [facility]) Staging form: Breast, AJCC 7th Edition - Clinical: Stage IIA (T2, N0, M0) - Signed by Forest Gleason, MD on 05/09/2015 Laterality: Right Estrogen receptor status: Positive Progesterone receptor status: Positive HER2 status: Positive    Oncology History Overview Note  1. 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  Estrogen receptor positive.  Progesterone receptor positive.  HER-2/neu receptor amplified. 2. Started on neoadjuvant chemotherapy,  Taxotere, carboplatin and Herceptin and perjeta (March 05, 2014) 3. Patient does not have any clinically significant BRCA mutation Has been on and to [place] Mutation of unknown significance. . 4. 4 cycles of chemotherapy with TCH,perjeta previous did not June of 201 5.  Because of progressing side effect remaining 2 cycles of chemotherapy was not given but patient continued Herceptin and aperjeta  5. Status post right breast simple mastectomy in August of 2015 ypT1  ypN0 stage IB 6.patient started on tamoxifen in October of 2015 along with maintenance Herceptin therapy Patient is going for reconstructive surgery on October 21, 2014 final reconstructive surgery was done in February of 2016 [name] finished Herceptin therapy on March 11, 2015  # Sep 2019- STOP TAM; start Arimidex 34m/day.   # TAH & BSO [Dr.Harris; April 2019- menorrhagia] -------------------------------------------------    DIAGNOSIS: Breast cancer  STAGE:   II     ;GOALS: Cure  CURRENT/MOST RECENT  THERAPY: Aromatase inhibitor    Carcinoma of upper-outer quadrant of right breast in female, estrogen receptor positive ([place]   INTERVAL HISTORY:  Somya A Gundry 50y.o.  female right-sided stage II ER PR positive HER-2/neu positive status post mastectomy currently on anastrozole [TAH/BSO] is here for follow-up.  No new lumps or bumps.  No headaches.  No nausea or vomiting.  Intentional weight loss.  Review of Systems  Constitutional:  Positive for malaise/fatigue. Negative for chills, diaphoresis and fever.  HENT: Negative.  Negative for nosebleeds and sore throat.   Eyes: Negative.  Negative for double vision.  Respiratory: Negative.  Negative for cough, hemoptysis, sputum production, shortness of breath and wheezing.   Cardiovascular: Negative.  Negative for chest pain, palpitations, orthopnea and leg swelling.  Gastrointestinal: Negative.  Negative for abdominal pain, blood in stool, constipation, diarrhea, heartburn, melena, nausea and vomiting.  Genitourinary: Negative.  Negative for dysuria, frequency and urgency.  Musculoskeletal:  Positive for joint pain and myalgias. Negative for back pain.  Skin: Negative.  Negative for itching and rash.  Neurological:  Negative for dizziness, tingling, focal weakness and weakness.  Endo/Heme/Allergies: Negative.  Does not bruise/bleed easily.  Psychiatric/Behavioral: Negative.  Negative for depression. The patient is not nervous/anxious and does not have insomnia.     REVIEW OF SYSTEMS:  A complete 10 point review of system is done which is negative except mentioned above/history of present illness.   PAST MEDICAL HISTORY :  Past Medical History:  Diagnosis Date   Anemia    Anxiety    Atypical squamous cell changes of undetermined significance (ASCUS) on cervical cytology with negative high risk human papilloma virus (HPV) test result 06/20/2016   Breast cancer ([facility] March 2015    Clinical  T1c, N0, BRCA negative- Right   Dysplasia  of cervix, low grade (CIN 1)    Genetic testing of female 02/2014   CHEK2(09/22/16 no clinical significance)   Malignant neoplasm of upper-outer quadrant of female breast [facility]) August 2015   pT1b, N0; M0. ER-positive, PR-positive,HER-2/neu overexpression   Menorrhagia    Migraines    migraines   Personal history of chemotherapy    Vitamin D deficiency     PAST SURGICAL HISTORY :   Past Surgical History:  Procedure Laterality Date   ABDOMINAL HYSTERECTOMY     AUGMENTATION MAMMAPLASTY Bilateral Nov 2016   Dr Tula Nakayama REMOVED 07/2016   BREAST BIOPSY Right 02/2014   positive   BREAST RECONSTRUCTION Right 04/14/2015   Procedure: AEROLA/NIPPLE RECONSTRUCTION WITH GRAFT;  Surgeon: Nicholaus Bloom, MD;  Location: [place];  Service: Plastics;  Laterality: Right;   BREAST SURGERY Right July 10, 2014   Right simple mastectomy with sentinel node biopsy.   CERVICAL BIOPSY  W/ LOOP ELECTRODE EXCISION  1998   COLONOSCOPY WITH PROPOFOL N/A 05/04/2016   Procedure: COLONOSCOPY WITH PROPOFOL;  Surgeon: Robert Bellow, MD;  Location: [facility] ENDOSCOPY;  Service: Endoscopy;  Laterality: N/A;   CYSTOSCOPY  03/15/2018   Procedure: CYSTOSCOPY;  Surgeon: Gae Dry, MD;  Location: [facility] ORS;  Service: Gynecology;;   DILATATION & CURETTAGE/HYSTEROSCOPY WITH MYOSURE N/A 08/03/2017   Procedure: Sylva;  Surgeon: Gae Dry, MD;  Location: [facility] OR[facility];  Service: Gynecology;  Laterality: N/A;   ESSURE TUBAL LIGATION  09/15/2010   EXCISIONAL HEMORRHOIDECTOMY  2015   I and D of hemorrhoid  05/2017   Dr. Jamal Collin   LAPAROSCOPIC HYSTERECTOMY Bilateral 03/15/2018   Procedure: HYSTERECTOMY TOTAL LAPAROSCOPIC BILATERAL SALPINGO OOPHORECTOMY;  Surgeon: Gae Dry, MD;  Location: [facility] OR[facility];  Service: Gynecology;  Laterality: Bilateral;   MASTECTOMY Right 07/2014   chemo and herceptin tx   PORTACATH PLACEMENT     TUBAL LIGATION      FAMILY HISTORY :   Family  History  Problem Relation Age of Onset   Cervical cancer Maternal Aunt 26   Hypertension Mother    Heart disease Maternal Grandmother    Breast cancer Paternal Aunt 32   Ovarian cancer Neg Hx    Colon cancer Neg Hx     SOCIAL HISTORY:   Social History   Tobacco Use   Smoking status: Never   Smokeless tobacco: Never  Vaping Use   Vaping Use: Never used  Substance Use Topics   Alcohol use: No   Drug use: No    ALLERGIES:  is allergic to cephalexin.  MEDICATIONS:  Current Outpatient Medications  Medication Sig Dispense Refill   anastrozole (ARIMIDEX) 1 MG tablet Take 1 tablet (1 mg total) by mouth daily. 90 tablet 3   Biotin 10000 MCG TABS Take 10,000 mcg by mouth at bedtime.     clonazePAM (KLONOPIN) 0.5 MG tablet Take 1 mg by mouth at bedtime.     levocetirizine (XYZAL) 5 MG tablet Take 5 mg by mouth every evening.      Vitamin D, Ergocalciferol, (DRISDOL) 1.25 MG (50000 UNIT) CAPS capsule TAKE 1 CAPSULE BY MOUTH ONE TIME PER WEEK 12 capsule 2   cloNIDine (CATAPRES) 0.1 MG tablet TAKE 1 TABLET (0.1 MG TOTAL) BY MOUTH DAILY. FOR HOT FLASHES 90 tablet 1   mirtazapine (REMERON) 30 MG tablet TAKE 1 TABLET (30 MG TOTAL) BY MOUTH AT BEDTIME. 90 tablet 3   No current facility-administered  medications for this visit.    PHYSICAL EXAMINATION: ECOG PERFORMANCE STATUS: 0 - Asymptomatic  BP 126/75   Pulse (!) 51   Temp 98.6 F (37 C)   Resp 20   Wt 159 lb 3.2 oz (72.2 kg)   LMP 02/15/2018 (Exact Date)   SpO2 100%   BMI 28.20 kg/m   Filed Weights   09/06/21 1052  Weight: 159 lb 3.2 oz (72.2 kg)   Physical Exam HENT:     Head: Normocephalic and atraumatic.     Mouth/Throat:     Pharynx: No oropharyngeal exudate.  Eyes:     Pupils: Pupils are equal, round, and reactive to light.  Cardiovascular:     Rate and Rhythm: Normal rate and regular rhythm.  Pulmonary:     Effort: No respiratory distress.     Breath sounds: No wheezing.  Abdominal:     General: Bowel  sounds are normal. There is no distension.     Palpations: Abdomen is soft. There is no mass.     Tenderness: There is no abdominal tenderness. There is no guarding or rebound.  Musculoskeletal:        General: No tenderness. Normal range of motion.     Cervical back: Normal range of motion and neck supple.  Skin:    General: Skin is warm.  Neurological:     Mental Status: She is alert and oriented to person, place, and time.  Psychiatric:        Mood and Affect: Affect normal.     LABORATORY DATA:  I have reviewed the data as listed    Component Value Date/Time   NA 137 09/06/2021 1008   NA 143 12/31/2014 1313   K 3.5 09/06/2021 1008   K 3.5 12/31/2014 1313   CL 103 09/06/2021 1008   CL 110 (H) 12/31/2014 1313   CO2 24 09/06/2021 1008   CO2 28 12/31/2014 1313   GLUCOSE 87 09/06/2021 1008   GLUCOSE 124 (H) 12/31/2014 1313   BUN 11 09/06/2021 1008   BUN 13 12/31/2014 1313   CREATININE 0.80 09/06/2021 1008   CREATININE 0.89 12/31/2014 1313   CALCIUM 9.3 09/06/2021 1008   CALCIUM 8.4 (L) 12/31/2014 1313   PROT 8.1 09/06/2021 1008   PROT 6.9 12/31/2014 1313   ALBUMIN 4.6 09/06/2021 1008   ALBUMIN 3.5 12/31/2014 1313   AST 27 09/06/2021 1008   AST 21 12/31/2014 1313   ALT 18 09/06/2021 1008   ALT 23 12/31/2014 1313   ALKPHOS 82 09/06/2021 1008   ALKPHOS 66 12/31/2014 1313   BILITOT 0.4 09/06/2021 1008   BILITOT 0.2 12/31/2014 1313   GFRNONAA >60 09/06/2021 1008   GFRNONAA >60 12/31/2014 1313   GFRNONAA >60 07/16/2014 1351   GFRAA >60 06/19/2020 0802   GFRAA >60 12/31/2014 1313   GFRAA >60 07/16/2014 1351    No results found for: SPEP, UPEP  Lab Results  Component Value Date   WBC 5.3 09/06/2021   NEUTROABS 2.6 09/06/2021   HGB 12.6 09/06/2021   HCT 39.7 09/06/2021   MCV 87.8 09/06/2021   PLT 224 09/06/2021      Chemistry      Component Value Date/Time   NA 137 09/06/2021 1008   NA 143 12/31/2014 1313   K 3.5 09/06/2021 1008   K 3.5 12/31/2014 1313    CL 103 09/06/2021 1008   CL 110 (H) 12/31/2014 1313   CO2 24 09/06/2021 1008   CO2 28 12/31/2014 1313  BUN 11 09/06/2021 1008   BUN 13 12/31/2014 1313   CREATININE 0.80 09/06/2021 1008   CREATININE 0.89 12/31/2014 1313      Component Value Date/Time   CALCIUM 9.3 09/06/2021 1008   CALCIUM 8.4 (L) 12/31/2014 1313   ALKPHOS 82 09/06/2021 1008   ALKPHOS 66 12/31/2014 1313   AST 27 09/06/2021 1008   AST 21 12/31/2014 1313   ALT 18 09/06/2021 1008   ALT 23 12/31/2014 1313   BILITOT 0.4 09/06/2021 1008   BILITOT 0.2 12/31/2014 1313       RADIOGRAPHIC STUDIES: I have personally reviewed the radiological images as listed and agreed with the findings in the report. No results found.   ASSESSMENT & PLAN:  Carcinoma of upper-outer quadrant of right breast in female, estrogen receptor positive ([place]) #Stage II ER/PR positive HER-2/neu positive right breast cancer-clinically, no evidence of recurrence today.  Currently on Extended Anastrozole [Oct, 2025].  Clinically  STABLE.   #Abnormal tumor markers-intermittently slightly abnormal elevated; ; however CT scan chest and pelvis-December 2021 NED.  As patient is clinically asymptomatic.  Continue surveillance; hold off on imaging patient in agreement.  Awaiting tumor markers from today.  # BMD- Jan 2021- T score: 0.9- NORMAL.  Continue calcium plus vitamin D. STABLE.  We will repeat bone density again in 2 years/ordered today.  #  DISPOSITION: my chart # Follow up in 6 months- MD;2-3 days prior- labs- cbc/cmp/ ca-27-29; Ca15-3; vitD levels; BMD--Dr.B   Orders Placed This Encounter  Procedures   DG Bone Density    Standing Status:   Future    Standing Expiration Date:   09/06/2022    Order Specific Question:   Reason for Exam (SYMPTOM  OR DIAGNOSIS REQUIRED)    Answer:   osteoporosis/osteopenia    Order Specific Question:   Preferred imaging location?    Answer:   [facility]    Order Specific Question:   Is the patient  pregnant?    Answer:   No   CBC with Differential    Standing Status:   Future    Standing Expiration Date:   09/06/2022   Comprehensive metabolic panel    Standing Status:   Future    Standing Expiration Date:   09/06/2022   Cancer antigen 15-3    Standing Status:   Future    Standing Expiration Date:   09/06/2022   Cancer antigen 27.29    Standing Status:   Future    Standing Expiration Date:   09/06/2022      Cammie Sickle, MD 09/06/2021 4:16 PM

## 2021-09-07 LAB — CANCER ANTIGEN 15-3: CA 15-3: 34.6 U/mL — ABNORMAL HIGH (ref 0.0–25.0)

## 2021-09-07 LAB — CANCER ANTIGEN 27.29: CA 27.29: 39.2 U/mL — ABNORMAL HIGH (ref 0.0–38.6)

## 2021-09-22 ENCOUNTER — Encounter: Payer: Self-pay | Admitting: Internal Medicine

## 2021-09-23 ENCOUNTER — Other Ambulatory Visit: Payer: Self-pay | Admitting: Internal Medicine

## 2021-09-23 DIAGNOSIS — C50411 Malignant neoplasm of upper-outer quadrant of right female breast: Secondary | ICD-10-CM

## 2021-09-23 DIAGNOSIS — Z170 Estrogen receptor positive status [ER+]: Secondary | ICD-10-CM

## 2021-09-23 DIAGNOSIS — R978 Other abnormal tumor markers: Secondary | ICD-10-CM

## 2021-09-23 NOTE — Progress Notes (Signed)
CT scan is ordered- please schedule- Thanks, GB

## 2021-09-23 NOTE — Progress Notes (Signed)
LVM about appt told to call us if any questions

## 2021-10-07 ENCOUNTER — Ambulatory Visit: Admission: RE | Admit: 2021-10-07 | Payer: BC Managed Care – PPO | Source: Ambulatory Visit

## 2022-01-17 ENCOUNTER — Other Ambulatory Visit: Payer: Self-pay | Admitting: General Surgery

## 2022-01-17 DIAGNOSIS — Z1231 Encounter for screening mammogram for malignant neoplasm of breast: Secondary | ICD-10-CM

## 2022-02-08 ENCOUNTER — Telehealth: Payer: Self-pay | Admitting: Nurse Practitioner

## 2022-02-08 NOTE — Telephone Encounter (Signed)
Left VM with patient to let her know that Sandra Nguyen will not be in the office on 03/07/22 and that her appointment time needs to change from the afternoon to the morning that day so that she can see a different provider. Requested she call back if those changes do not work for her. Sending updated appt reminder via mail.  ?

## 2022-03-04 ENCOUNTER — Other Ambulatory Visit: Payer: BC Managed Care – PPO

## 2022-03-07 ENCOUNTER — Ambulatory Visit: Payer: BC Managed Care – PPO | Admitting: Oncology

## 2022-03-07 ENCOUNTER — Ambulatory Visit: Payer: BC Managed Care – PPO | Admitting: Nurse Practitioner

## 2022-03-15 ENCOUNTER — Ambulatory Visit
Admission: RE | Admit: 2022-03-15 | Discharge: 2022-03-15 | Disposition: A | Discharge status: Home / Self Care | Payer: BC Managed Care – PPO | Source: Ambulatory Visit | Attending: General Surgery | Admitting: General Surgery

## 2022-03-15 DIAGNOSIS — Z1231 Encounter for screening mammogram for malignant neoplasm of breast: Secondary | ICD-10-CM | POA: Insufficient documentation

## 2023-01-31 ENCOUNTER — Other Ambulatory Visit: Payer: Self-pay | Admitting: Family Medicine

## 2023-01-31 DIAGNOSIS — Z1231 Encounter for screening mammogram for malignant neoplasm of breast: Secondary | ICD-10-CM

## 2023-04-11 ENCOUNTER — Ambulatory Visit
Admission: RE | Admit: 2023-04-11 | Discharge: 2023-04-11 | Disposition: A | Discharge status: Home / Self Care | Payer: BC Managed Care – PPO | Source: Ambulatory Visit | Attending: Family Medicine | Admitting: Family Medicine

## 2023-04-11 DIAGNOSIS — Z1231 Encounter for screening mammogram for malignant neoplasm of breast: Secondary | ICD-10-CM | POA: Diagnosis not present

## 2024-05-06 ENCOUNTER — Other Ambulatory Visit: Payer: Self-pay | Admitting: Family Medicine

## 2024-05-06 DIAGNOSIS — Z1231 Encounter for screening mammogram for malignant neoplasm of breast: Secondary | ICD-10-CM

## 2024-05-20 ENCOUNTER — Other Ambulatory Visit: Payer: Self-pay

## 2024-05-20 ENCOUNTER — Other Ambulatory Visit: Payer: Self-pay | Admitting: Family Medicine

## 2024-05-20 ENCOUNTER — Ambulatory Visit
Admission: RE | Admit: 2024-05-20 | Discharge: 2024-05-20 | Disposition: A | Discharge status: Home / Self Care | Payer: Self-pay | Source: Ambulatory Visit | Attending: Family Medicine | Admitting: Family Medicine

## 2024-05-20 DIAGNOSIS — Z1231 Encounter for screening mammogram for malignant neoplasm of breast: Secondary | ICD-10-CM
